# Patient Record
Sex: Female | Born: 1960
Health system: Southern US, Community
[De-identification: ages and names within clinical notes are randomized; demographics above are authoritative.]

## PROBLEM LIST (undated history)

## (undated) DIAGNOSIS — M25561 Pain in right knee: Secondary | ICD-10-CM

## (undated) DIAGNOSIS — E119 Type 2 diabetes mellitus without complications: Secondary | ICD-10-CM

## (undated) DIAGNOSIS — I00 Rheumatic fever without heart involvement: Secondary | ICD-10-CM

## (undated) DIAGNOSIS — M1990 Unspecified osteoarthritis, unspecified site: Secondary | ICD-10-CM

## (undated) DIAGNOSIS — I10 Essential (primary) hypertension: Secondary | ICD-10-CM

## (undated) DIAGNOSIS — K219 Gastro-esophageal reflux disease without esophagitis: Secondary | ICD-10-CM

## (undated) DIAGNOSIS — E669 Obesity, unspecified: Secondary | ICD-10-CM

## (undated) DIAGNOSIS — M25562 Pain in left knee: Secondary | ICD-10-CM

## (undated) DIAGNOSIS — J45909 Unspecified asthma, uncomplicated: Secondary | ICD-10-CM

## (undated) DIAGNOSIS — D649 Anemia, unspecified: Secondary | ICD-10-CM

## (undated) HISTORY — DX: Pain in left knee: M25.562

## (undated) HISTORY — DX: Unspecified osteoarthritis, unspecified site: M19.90

## (undated) HISTORY — DX: Anemia, unspecified: D64.9

## (undated) HISTORY — DX: Unspecified asthma, uncomplicated: J45.909

## (undated) HISTORY — DX: Rheumatic fever without heart involvement: I00

## (undated) HISTORY — DX: Obesity, unspecified: E66.9

## (undated) HISTORY — DX: Pain in right knee: M25.561

## (undated) HISTORY — DX: Gastro-esophageal reflux disease without esophagitis: K21.9

---

## 1998-03-09 ENCOUNTER — Encounter: Admission: RE | Admit: 1998-03-09 | Discharge: 1998-03-09 | Payer: Self-pay | Admitting: Family Medicine

## 1998-03-31 ENCOUNTER — Encounter: Admission: RE | Admit: 1998-03-31 | Discharge: 1998-03-31 | Payer: Self-pay | Admitting: Family Medicine

## 1998-04-18 ENCOUNTER — Encounter: Admission: RE | Admit: 1998-04-18 | Discharge: 1998-04-18 | Payer: Self-pay | Admitting: Family Medicine

## 1998-05-02 ENCOUNTER — Encounter: Admission: RE | Admit: 1998-05-02 | Discharge: 1998-05-02 | Payer: Self-pay | Admitting: Sports Medicine

## 1998-10-12 ENCOUNTER — Ambulatory Visit (HOSPITAL_COMMUNITY): Admission: RE | Admit: 1998-10-12 | Discharge: 1998-10-12 | Payer: Self-pay | Admitting: Internal Medicine

## 1998-10-12 ENCOUNTER — Encounter: Payer: Self-pay | Admitting: Internal Medicine

## 1999-05-13 ENCOUNTER — Emergency Department (HOSPITAL_COMMUNITY): Admission: EM | Admit: 1999-05-13 | Discharge: 1999-05-13 | Payer: Self-pay | Admitting: Emergency Medicine

## 1999-05-13 ENCOUNTER — Encounter: Payer: Self-pay | Admitting: *Deleted

## 1999-06-22 ENCOUNTER — Other Ambulatory Visit: Admission: RE | Admit: 1999-06-22 | Discharge: 1999-06-22 | Payer: Self-pay | Admitting: Obstetrics and Gynecology

## 1999-07-10 ENCOUNTER — Encounter (INDEPENDENT_AMBULATORY_CARE_PROVIDER_SITE_OTHER): Payer: Self-pay | Admitting: Specialist

## 1999-07-10 ENCOUNTER — Other Ambulatory Visit: Admission: RE | Admit: 1999-07-10 | Discharge: 1999-07-10 | Payer: Self-pay | Admitting: Obstetrics and Gynecology

## 2000-07-22 ENCOUNTER — Other Ambulatory Visit: Admission: RE | Admit: 2000-07-22 | Discharge: 2000-07-22 | Payer: Self-pay | Admitting: Obstetrics and Gynecology

## 2000-07-23 ENCOUNTER — Encounter: Admission: RE | Admit: 2000-07-23 | Discharge: 2000-07-23 | Payer: Self-pay | Admitting: Obstetrics and Gynecology

## 2000-07-23 ENCOUNTER — Encounter: Payer: Self-pay | Admitting: Obstetrics and Gynecology

## 2001-03-16 ENCOUNTER — Encounter (INDEPENDENT_AMBULATORY_CARE_PROVIDER_SITE_OTHER): Payer: Self-pay | Admitting: Specialist

## 2001-03-16 ENCOUNTER — Other Ambulatory Visit: Admission: RE | Admit: 2001-03-16 | Discharge: 2001-03-16 | Payer: Self-pay | Admitting: Obstetrics and Gynecology

## 2001-04-15 ENCOUNTER — Encounter (INDEPENDENT_AMBULATORY_CARE_PROVIDER_SITE_OTHER): Payer: Self-pay

## 2001-04-15 ENCOUNTER — Ambulatory Visit (HOSPITAL_COMMUNITY): Admission: RE | Admit: 2001-04-15 | Discharge: 2001-04-15 | Payer: Self-pay | Admitting: Obstetrics and Gynecology

## 2001-08-24 ENCOUNTER — Ambulatory Visit (HOSPITAL_COMMUNITY): Admission: RE | Admit: 2001-08-24 | Discharge: 2001-08-24 | Payer: Self-pay | Admitting: Obstetrics and Gynecology

## 2001-08-24 ENCOUNTER — Encounter: Payer: Self-pay | Admitting: Obstetrics and Gynecology

## 2001-09-23 ENCOUNTER — Other Ambulatory Visit: Admission: RE | Admit: 2001-09-23 | Discharge: 2001-09-23 | Payer: Self-pay | Admitting: Obstetrics and Gynecology

## 2002-10-19 ENCOUNTER — Encounter: Payer: Self-pay | Admitting: Obstetrics and Gynecology

## 2002-10-19 ENCOUNTER — Ambulatory Visit (HOSPITAL_COMMUNITY): Admission: RE | Admit: 2002-10-19 | Discharge: 2002-10-19 | Payer: Self-pay | Admitting: Obstetrics and Gynecology

## 2002-11-30 ENCOUNTER — Other Ambulatory Visit: Admission: RE | Admit: 2002-11-30 | Discharge: 2002-11-30 | Payer: Self-pay | Admitting: Obstetrics and Gynecology

## 2003-02-21 ENCOUNTER — Encounter: Payer: Self-pay | Admitting: Internal Medicine

## 2003-02-21 ENCOUNTER — Encounter: Admission: RE | Admit: 2003-02-21 | Discharge: 2003-02-21 | Payer: Self-pay | Admitting: Internal Medicine

## 2003-07-15 ENCOUNTER — Encounter: Payer: Self-pay | Admitting: Obstetrics and Gynecology

## 2003-07-15 ENCOUNTER — Encounter: Admission: RE | Admit: 2003-07-15 | Discharge: 2003-07-15 | Payer: Self-pay | Admitting: Obstetrics and Gynecology

## 2004-09-21 ENCOUNTER — Ambulatory Visit (HOSPITAL_COMMUNITY): Admission: RE | Admit: 2004-09-21 | Discharge: 2004-09-21 | Payer: Self-pay | Admitting: Gastroenterology

## 2004-10-25 ENCOUNTER — Encounter: Admission: RE | Admit: 2004-10-25 | Discharge: 2004-10-25 | Payer: Self-pay | Admitting: Obstetrics and Gynecology

## 2004-12-09 HISTORY — PX: ABDOMINAL HYSTERECTOMY: SHX81

## 2004-12-09 HISTORY — PX: HERNIA REPAIR: SHX51

## 2005-01-19 ENCOUNTER — Encounter: Admission: RE | Admit: 2005-01-19 | Discharge: 2005-01-19 | Payer: Self-pay | Admitting: Obstetrics and Gynecology

## 2005-05-22 ENCOUNTER — Inpatient Hospital Stay (HOSPITAL_COMMUNITY): Admission: RE | Admit: 2005-05-22 | Discharge: 2005-05-23 | Payer: Self-pay | Admitting: General Surgery

## 2005-05-22 ENCOUNTER — Encounter (INDEPENDENT_AMBULATORY_CARE_PROVIDER_SITE_OTHER): Payer: Self-pay | Admitting: Specialist

## 2005-11-11 ENCOUNTER — Encounter: Admission: RE | Admit: 2005-11-11 | Discharge: 2005-11-11 | Payer: Self-pay | Admitting: Obstetrics and Gynecology

## 2006-12-30 ENCOUNTER — Encounter: Admission: RE | Admit: 2006-12-30 | Discharge: 2006-12-30 | Payer: Self-pay | Admitting: Obstetrics and Gynecology

## 2008-01-09 ENCOUNTER — Encounter: Admission: RE | Admit: 2008-01-09 | Discharge: 2008-01-09 | Payer: Self-pay | Admitting: Internal Medicine

## 2008-01-30 ENCOUNTER — Encounter: Admission: RE | Admit: 2008-01-30 | Discharge: 2008-01-30 | Payer: Self-pay | Admitting: Internal Medicine

## 2008-04-10 ENCOUNTER — Emergency Department (HOSPITAL_COMMUNITY): Admission: EM | Admit: 2008-04-10 | Discharge: 2008-04-10 | Payer: Self-pay | Admitting: Emergency Medicine

## 2008-04-12 ENCOUNTER — Ambulatory Visit (HOSPITAL_COMMUNITY): Admission: RE | Admit: 2008-04-12 | Discharge: 2008-04-12 | Payer: Self-pay | Admitting: Emergency Medicine

## 2008-09-12 ENCOUNTER — Encounter: Admission: RE | Admit: 2008-09-12 | Discharge: 2008-09-12 | Payer: Self-pay | Admitting: Obstetrics and Gynecology

## 2010-12-30 ENCOUNTER — Encounter: Payer: Self-pay | Admitting: Obstetrics and Gynecology

## 2011-04-26 NOTE — Op Note (Signed)
NAMEANALYAH, MCCONNON             ACCOUNT NO.:  [id number]   MEDICAL RECORD NO.:  [id number]          PATIENT TYPE:  INP   LOCATION:  9311                          FACILITY:  [facility]   PHYSICIAN:  Maxie Better, M.D.DATE OF BIRTH:  July 03, 1961   DATE OF PROCEDURE:  05/22/2005  DATE OF DISCHARGE:                                 OPERATIVE REPORT   PREOPERATIVE DIAGNOSIS:  Severe dysmenorrhea, pelvic pain, menorrhagia,  uterine fibroids, morbid obesity, chronic hypertension, (BMI equals 65),  epigastric hernia.   POSTOPERATIVE DIAGNOSIS:  Submucosal fibroid, intramural fibroids,  menorrhagia, severe dysmenorrhea, morbid obesity, (BMI equals 65).   PROCEDURE:  Total vaginal hysterectomy with vaginal myomectomy.   ANESTHESIA:  General.   SURGEON:  Maxie Better, M.D.   ASSISTANT:  Cordelia Pen A. Dickstein, M.D.   DESCRIPTION OF PROCEDURE:  Under adequate general anesthesia, the patient  was placed in the dorsal lithotomy position.  Her abdomen and vulvar  perineal area and vagina were sterilely prepped and draped in the usual  sterile fashion for a combined abdominal and vaginal procedure.  An  indwelling Foley catheter was sterilely placed.  Examination under  anesthesia revealed an irregular 10 weeks size uterus, however, examination  was limited by the patient's body habitus.  A weighted speculum was placed  in the vagina.  Sims retractor was used anteriorly.  Cervix was grasped on  the anterior and posterior lip with Jacobsen clamps.  Dilute solution of  Pitressin was then circumferentially injected at the cervicovaginal  junction.  A circumferential incision was then made at the vaginal junction  and undermined with Mayo scissors.  The posterior cul-de-sac was then opened  without incident.  The vaginal cuff posteriorly was then hemostased with  running locked stitch of 0 Vicryl suture.  The uterosacral ligaments were  then bilaterally clamped, cut, and suture ligated with  Heaney suture and 0  Vicryl.  The cardinal ligaments were then serially clamped with the LigaSure  and then cut.  This procedure was carried up to the uterine vessels which  were then also clamped, cauterized, and cut.  Traction on the cervix did not  reveal significant descent of the uterine body and the procedure was then  continued in small increments of clamping and cutting the attachment to the  lateral wall.  The cervix was then bivalved and the large 6 cm submucosal  fibroid was noted.  This fibroid was enucleated from its base with sharp  dissection and then ultimately pulled out with thyroid clamps.  The  procedure was then continued and again the body of the uterus came down  somewhat further, but without as much exposure to the lateral side.  The  attachment of the round and the utero-ovarian ligament on the left was still  present, but exposure at that point was limited.  At this point, the uterus  was then morcellated further in the midline.  The cervix was truncated off  the body of the uterus and additional uterine tissue was then removed in  chunks.  That allowed for the left utero-ovarian ligament to be identified,  clamped, cut with the  left-sided attachment being removed.  The ligament was  then free tied with 0 Vicryl and then suture ligated with 0 Vicryl.  The  left half of the uterus was then removed.  The right on palpation of the  right ovary and tubal area showed a right cornual intramural fibroid,  portion which was then seen once the half of the uterus had been removed.  With the fibroid being visual, the fibroid was then sharply and bluntly  enucleated from its base in order to facilitate removal of the remaining  portion of the uterus.  Once that was accomplished, the right ovary which  was otherwise normal could be seen and the right tube.  The right utero-  ovarian ligaments were then clamped with the LigaSure, cauterized, and cut  until the remaining portion  of the uterus was removed.  The tubal attachment  was then clamped, cut, and then free tied with 0 Vicryl with hemostasis  noted.  The uterus was now removed.  Bleeding along the left pelvic side  wall of the vaginal area was suture ligated with 0 Vicryl figure-of-eight  suture.  The vagina after the areas were inspected, was then closed in a  vertical fashion using 0 Vicryl interrupted figure-of-eight sutures.  The  vaginal cuff was reinforced with 0 Vicryl figure-of-eight suture at the  posterior aspect secondary to bleeding.  Abraded area superior to the  urethral area was noted from the manipulation and 4-0 chromic suture was  then used to approximate this area of the skin as well as on the left  vaginal side wall.  Specimen was then uterus, removed in pieces.  Aggregate  pieces weighed 434 grams.  Estimated blood loss was 500 mL.  Intraoperative  fluid was 2 liters.  Urine output was 200 mL clear yellow urine.  Needle,  sponge, and instrument counts correct.  The patient then underwent the  epigastric hernia repair portion of the case by Leonie Man, M.D. and  please see his dictation for the specific details.       [place]/MEDQ  D:  05/22/2005  T:  05/22/2005  Job:  [id number]

## 2011-04-26 NOTE — Op Note (Signed)
Linda Cooper, Linda Cooper             ACCOUNT NO.:  [id number]   MEDICAL RECORD NO.:  [id number]          PATIENT TYPE:  AMB   LOCATION:  ENDO                         FACILITY:  [facility]   PHYSICIAN:  Anselmo Rod, M.D.  DATE OF BIRTH:  [birth year]   DATE OF PROCEDURE:  09/21/2004  DATE OF DISCHARGE:                                 OPERATIVE REPORT   PROCEDURE PERFORMED:  Esophagogastroduodenoscopy.   ENDOSCOPIST:  Anselmo Rod, M.D.   INSTRUMENT USED:  Olympus video panendoscope.   INDICATION FOR PROCEDURE:  A 50 year old African-American female with a  history of mild anemia and blood in stool.  Rule out peptic ulcer disease,  esophagitis, gastritis, etc.   PREPROCEDURE PREPARATION:  Informed consent was procured from the patient.  The patient was fasted for eight hours prior to the procedure.   PREPROCEDURE PHYSICAL:  VITAL SIGNS:  The patient had stable vital signs.  NECK:  Supple.  CHEST:  Clear to auscultation.  S1, S2 regular.  ABDOMEN:  Soft with normal bowel sounds.   DESCRIPTION OF PROCEDURE:  The patient was placed in the left lateral  decubitus position and sedated with 70 mg of Demerol and 7 mg of Versed in  slow incremental doses.  Once the patient was adequately sedate and  maintained on low-flow oxygen and continuous cardiac monitoring, the Olympus  video panendoscope was advanced through the mouthpiece, over the tongue,  into the esophagus under direct vision.  The entire esophagus appeared  normal with no evidence of ring, stricture, masses, esophagitis, or  Barrett's mucosa.  The stomach was then advanced in the stomach.  The entire  gastric mucosa and the proximal small bowel appeared normal.   IMPRESSION:  Normal EGD.   RECOMMENDATIONS:  Proceed with a colonoscopy at this time, further  recommendations made thereafter.       JNM/MEDQ  D:  09/21/2004  T:  09/21/2004  Job:  [id number]   cc:   Merlene Laughter. Renae Gloss, M.D.  [address]  [address]  [place]  [place] [address]  Fax: [phone]

## 2011-04-26 NOTE — Op Note (Signed)
Linda Cooper, Linda Cooper             ACCOUNT NO.:  [id number]   MEDICAL RECORD NO.:  [id number]          PATIENT TYPE:  AMB   LOCATION:  ENDO                         FACILITY:  [facility]   PHYSICIAN:  Anselmo Rod, M.D.  DATE OF BIRTH:  1960/12/26   DATE OF PROCEDURE:  09/21/2004  DATE OF DISCHARGE:                                 OPERATIVE REPORT   PROCEDURE:  Screening colonoscopy.   ENDOSCOPIST:  Anselmo Rod, M.D.   INSTRUMENT:  Olympus video colonoscope.   INDICATIONS FOR PROCEDURE:  A 50 year old morbidly obese African-American  female with a history of anemia, hemoglobin 11.5 g/dL, BRBPR, change in  bowel habits.  To rule out colonic polyps, masses, etc.   PRE-PROCEDURE PREPARATION:  Informed consent was procured from the patient.  Patient fasted for 8 hours prior to the procedure and prepped with a bottle  of magnesium citrate and a gallon of GoLYTELY the night prior to the  procedure.  Pre-procedure physical:  Patient had stable vital signs, neck  supple, chest clear to auscultation, S1/S2 regular, abdomen soft with normal  bowel sounds.   DESCRIPTION OF PROCEDURE:  The patient was placed in the left lateral  decubitus position, sedated with  Demerol and Versed at first for the EGD.  An additional mg of Versed was given for the colonoscopy.  Once the patient  was adequately sedated and maintained on low flow oxygen, continuous cardiac  monitoring; the Olympus video colonoscope was advanced from the rectum to  the cecum.  The appendiceal orifice and the ileocecal valve were clearly  visualized and photographed.  No masses, polyps, erosions or ulcerations,  etc., were seen.  Small, nonbleeding internal hemorrhoids were seen on  retroflexion of the rectum. The patient tolerated the procedure well without  immediate complications.   IMPRESSION:  1.  Normal colonoscopy to the cecum except for small internal hemorrhoids.      No masses or polyps seen.  2.  No evidence of  diverticulosis.   RECOMMENDATIONS:  1.  Repeat CBC on an outpatient basis.  2.  Continue a high fiber diet with liberal fluids intake.  3.  Repeat colonoscopy in the next 10 years unless the patient develops      abnormal symptoms in the interim.  4.  Outpatient follow up in the next 2 weeks or earlier if need be.       JNM/MEDQ  D:  09/21/2004  T:  09/21/2004  Job:  [id number]   cc:   Merlene Laughter. Renae Gloss, M.D.  [address]  [address]  [place]  [place] [address]  Fax: [phone]

## 2011-04-26 NOTE — H&P (Signed)
[facility]  Patient:    Linda Cooper, Linda Cooper                      MRN: [id number] Adm. Date:  04/15/01 Attending:  Nena Jordan A. Cherly Hensen, M.D.                         History and Physical  DATE OF BIRTH:                08/18/1961  CHIEF COMPLAINT:              Heavy menses, uterine fibroids.  HISTORY OF PRESENT ILLNESS:   This is a 50 year old gravida 1, para 1 single black female with last menstrual period April 03, 2001 who now presents for surgical management of menorrhagia and uterine fibroids.  The patient has noted very painful and heavy menses.  Ultrasound done on January 22, 2001 revealed a submucosal fibroid measuring 3.9 cm x 4.3 x 3.2 cm at its fundal area.  The patient also had additional small fibroids and multiple small follicles on the ovary.  The endometrium appeared to have a hypoechoic mass consistent with the fibroid that was noted.  The patient desires hysteroscopic resection of the fibroid if possible and if unable to be done transvaginally, then to proceed with abdominal myomectomy.  PAST MEDICAL HISTORY:         Morbid obesity, chronic hypertension, fibroid uterus.  ALLERGIES:                    ERYTHROMYCIN.  MEDICATIONS:                  1. Cozaar 100 mg p.o. q.d.                               2. Phentermine.                               3. Hydralazine 50 mg p.o. q.d.                               4. Glucophage 500 mg one p.o. b.i.d. (the                                  patient admits to not taking that regularly).                               5. Norvasc 10 mg p.o. q.d.  PAST SURGICAL HISTORY:        None.  FAMILY HISTORY:               No ______ or ovarian cancer.  Strong family history of diabetes.  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  REVIEW OF SYSTEMS:            Negative except for history of present illness.  PHYSICAL EXAMINATION  GENERAL:                      Obese black female in no  acute distress.  VITAL SIGNS:                  Blood pressure 118/88.  SKIN:                         No lesions.  HEENT:                        Anicteric sclerae.  Pink conjunctivae. Oropharynx:  Negative.  HEART:                        Regular rate and rhythm without murmur.  LUNGS:                        Clear to auscultation.  BREASTS:                      Soft, nontender.  No palpable mass.  BACK:                         No CVA tenderness.  ABDOMEN:                      Obese, soft, nontender with intertrigo underneath the pannus.  PELVIC:                       External genitalia:  No lesions.  Vagina:  No discharge.  Cervix:  Closed.  Uterus:  Irregular 8 weeks size.  Adnexa:  4-5 cm right palpable mass, however, examination limited by the patients body habitus.  IMPRESSION:                   Symptomatic uterine fibroid.  PLAN:                         Hysteroscopic resection of submucosal fibroid. If not, abdominal myomectomy.  Routine laboratories and antibiotics prophylaxis.  Risks and benefits of the procedure have been explained to the patient by her preoperative visit.  These include, but are not limited to, infection, bleeding, injury to surrounding organs, structural ______ injury, uterine perforation, inability to complete the procedure vaginally and possible need for blood transfusion, internal scar tissue, pelvic pain from scar tissue. DD:  04/15/01 TD:  04/15/01 Job: [id number]

## 2011-04-26 NOTE — Op Note (Signed)
[facility]  Patient:    Linda Cooper, Linda Cooper                    MRN: [id number] Proc. Date: 04/15/01 Adm. Date:  [id number] Attending:  Maxie Better                           Operative Report  PREOPERATIVE DIAGNOSIS:       1. Menorrhagia.                               2. Uterine fibroid.  POSTOPERATIVE DIAGNOSIS:      1. Menorrhagia.                               2. Submucosal fibroids.  OPERATION:                    1. Examination under anesthesia.                               2. Diagnostic hysteroscopy.                               3. Hysteroscopic resection of submucosal                                  fibroid.                               4. Dilatation and curettage.  SURGEON:                      Sheronette A. Cherly Hensen, M.D.  ASSISTANT:                    Cordelia Pen A. Rosalio Macadamia, M.D.  ANESTHESIA:                   General.  INDICATIONS:                  This is a 50 year old gravida 1, para 1 female with symptomatic uterine fibroids who now presents for further evaluation. Ultrasound had revealed a submucosal fibroid in the fundal area, and the patient was therefore counselled regarding the possibility that this may not be able to resected through the hysteroscope and possible need for abdominal myomectomy.  Risks and benefits of the procedure had been explained to the patient.  Consent was signed, and the patient was transferred to the operating room.  DESCRIPTION OF PROCEDURE:     Under adequate general anesthesia, the patient was placed in the dorsolithotomy position.  The patient had received prophylactic antibiotics intravenously.  Examination under anesthesia revealed anteverted uterus, irregular with a palpable right lateral fibroid, and no adnexal mass appreciated on the left.  The patient was thoroughly prepped and draped in the usual fashion.  The indwelling Foley catheter was placed in the preparation for  possible laparotomy.  Bivalve speculum was placed in the vagina.  Single-tooth tenaculum was place in the cervix.  The cervix was then serially dilated up to a #25 Pratt dilator.  A  glycine-primed diagnostic hysteroscope was introduced into the cavity for assessment.  Findings were that of both tubal ostia being able to be seen.  The fundus was without any evidence of a fibroid.  In the anterior and posterior wall, closer to the lower uterine segment, were submucosal fibroids.  The larger fibroid was in the anterior portion.  The diagnostic hysteroscope was removed.  The cervix was then serially dilated up to a #31 Pratt dilator, and a resectoscope was introduced.  Using the double loop, the anterior fibroid was subsequently resected.  The procedure was somewhat limited by the fogging of the camera as well as the loss of fluid on the floor as well as the undersurface garment of the patient in order to further assess the true fluid deficit.  Nevertheless, approximately 90% of the anterior fibroid was resected.  With the fluid deficit approximately 350 cc, decision was made to forego attempt at prolonging the surgery and resecting the posterior fibroid.  The fibroid was removed in pieces.  The resectoscope was removed.  The cavity was then explored with polyp forceps as well as curettage, and tissue was removed.  The procedure was then terminated by removal of all instruments from the vagina.  Specimen labelled.  Endometrial curetting and submucosal fibroid portions were sent to pathology.  Fluid deficit was 350 cc.  Complications none.  Estimated blood loss was about 100 cc.  The patient tolerated the procedure well and was transferred to the recovery room in stable condition. DD:  04/15/01 TD:  04/15/01 Job: [id number]

## 2011-04-26 NOTE — Op Note (Signed)
Linda Cooper, Linda Cooper             ACCOUNT NO.:  [id number]   MEDICAL RECORD NO.:  [id number]          PATIENT TYPE:  INP   LOCATION:  9399                          FACILITY:  [facility]   PHYSICIAN:  Leonie Man, M.D.   DATE OF BIRTH:  1961/06/01   DATE OF PROCEDURE:  05/22/2005  DATE OF DISCHARGE:                                 OPERATIVE REPORT   PREOPERATIVE DIAGNOSIS:  Incarcerated epigastric hernia.   POSTOPERATIVE DIAGNOSIS:  Incarcerated epigastric hernia.   PROCEDURE:  Repair of incarcerated ventral hernia with mesh.   SURGEON:  Leonie Man, M.D.   ASSISTANT:  O.R. Tech.   ANESTHESIA:  General.   NOTE:  The patient is a 50 year old morbidly obese female presenting with  epigastric pain and a mass noted to be a ventral hernia which has  incarcerated omentum within it.  The patient is scheduled for hysterectomy  on this day and headache undergone a vaginal hysterectomy with myomectomy at  the time I entered the operating room.  She was already asleep.  The abdomen  had been prepped and draped to be included in the sterile operative field.  I made an incision in the epigastrium and deepened this through the skin and  subcutaneous tissue, carrying the dissection down to the region of the  hernial defect.  The sac was grasped and dissected free from the surrounding  fat and soft tissues, carrying the dissection down to the fascia.  The  hernia sac was opened and dissected free.  Its attachments to the falciform  ligament were taken down between clamps and secured with ties of 2-0 silk.  The edges of the hernia sac were then cleared of all adhesions.  I placed a  piece of Seprafilm over the bowel so as to protect it from the mesh.  I used  an extra large mesh plug to bridge the defect, and this was sewn in with #1  Novofil suture in a continuous fashion around the entire defect.  The defect  was noted to be closed.  Sponge, instrument and sharp counts were verified.  The  subcutaneous tissues were then closed with a running 2-0 Vicryl suture  and the skin was closed with  running 4-0 Monocryl suture and then reinforced with Steri-Strips, sterile  dressings applied.  Anesthetic reversed, the patient removed from the  operating room to the recovery room in stable condition.  She tolerated the  procedure well.       PB/MEDQ  D:  05/22/2005  T:  05/22/2005  Job:  [id number]   cc:   Maxie Better, M.D.  [address]  [place]  [place] [address]  Fax: [phone]

## 2011-04-26 NOTE — H&P (Signed)
Linda Cooper, Linda Cooper             ACCOUNT NO.:  [id number]   MEDICAL RECORD NO.:  [id number]          PATIENT TYPE:  INP   LOCATION:  NA                            FACILITY:  [facility]   PHYSICIAN:  Sheronette Cousins, M.D.DATE OF BIRTH:  1961/10/10   DATE OF ADMISSION:  05/22/2005  DATE OF DISCHARGE:                                HISTORY & PHYSICAL   CHIEF COMPLAINT:  Pelvic pain, severe cramps, heavy menses, uterine  fibroids.   HISTORY OF PRESENT ILLNESS:  This is a 50 year old gravida 1, para 1,  morbidly obese single black female, last menstrual period of May 13, 2005,  who is now being admitted for total vaginal hysterectomy and possible total  abdominal hysterectomy. The patient will also undergo repair of an umbilical  hernia by Dr. Leonie Man. The patient has had regular cycles with  increasingly painful cramps. She is not sexually active. The patient has  noted increased clots.   Evaluation included an endometrial biopsy in March, 2006 that showed  proliferative endometrium and simple endometrial hyperplasia without atypia.  The patient underwent a pelvic MRI on January 19, 2005 with findings of the  study showing an anterior midline hernia with adipose tissue within it, a  uterus that was 13 cm in length, with some mucosal fibroids. A right-sided  intramural fibroid was also noted, measuring 9.3 cm.  The right ovary at that time that a 2.5 cm cyst, the left ovary was normal.  Ultrasound in the office on January 14, 2005: Ovaries were not able to be  seen and the uterus measured 10.8 x 8.6 x 7.5 with three fibroids noted, the  largest was 6.9 cm and was noted posteriorly, with a small submucosal  fibroid.   The patient presents for definitive surgical management. She desires  retention of her ovaries.   ALLERGIES:  ERYTHROMYCIN.   MEDICATIONS:  1.  Norvasc.  2.  Ditropan XL.   PAST MEDICAL HISTORY:  1.  Urge incontinence.  2.  Arthritis since age 40.  3.   Chronic hypertension.  4.  Morbid obesity.  5.  Degenerative joint disease.   PAST SURGICAL HISTORY:  Dilation and curettage, hysteroscopic fibroid  resection in 2002.   OBSTETRICAL HISTORY:  Vaginal delivery x 1.   FAMILY HISTORY:  Diabetes in mother, father and maternal grandmother, and  hypertension. No colon or breast cancer.   SOCIAL HISTORY:  Single R. N. One son.   REVIEW OF SYSTEMS:  Negative except for history of present illness.   PHYSICAL EXAMINATION:  GENERAL:  Morbidly obese black female in no acute  distress.  VITAL SIGNS: Blood pressure 130/90, pulse is 76, weight 334 pounds.  SKIN:  Notable for some facial hair.  HEENT EXAM:  Anicteric sclerae, pink conjunctivae. Oropharynx negative.  HEART:  Regular rate and rhythm without murmur.  LUNGS:  Clear to auscultation.  BREASTS:  Pedunculated, large, no palpable mass.  NECK:  Supple, thyroid not palpable.  BACK:  No CVA tenderness.  ABDOMEN:  Obese with a large pannus. Supraumbilical hernia.  PELVIC EXAM:  Vulva showed no lesions, vagina had some clots  in the vault.  Cervix was parous. Uterus was bulky, about 10 weeks size and rotated. Adnexa  limited by the patient's body habitus.  EXTREMITIES: No edema.  RECTAL EXAM:  Deferred.   IMPRESSION:  1.  Severe dysmenorrhea, menorrhagia, uterine fibroids.  2.  Morbid obesity.  3.  Chronic hypertension.  4.  Umbilical hernia.   PLAN:  Admission for total vaginal hysterectomy, possible total abdominal  hysterectomy.  Umbilical hernia repair by Dr. Lurene Shadow. Antibiotic prophylaxis, antiembolic  stockings.  The risk of the procedure had been explained to the patient including but  not limited to  inability to complete the procedure vaginally; infection; bleeding; injury  to bladder or bowel or ureter; possible need for blood transfusion and its  risk. Fistula formation. Pros and cons of ovarian retention including  possible need for surgery on the ovary due to pain.  Persistent cysts or  ovarian cancer, bowel obstruction in the future. The plan is to continue  antihypertensive medications, postoperative care and criteria for discharge  were reviewed.  All questions answered.       [place]/MEDQ  D:  05/22/2005  T:  05/22/2005  Job:  [id number]

## 2014-03-12 ENCOUNTER — Emergency Department (INDEPENDENT_AMBULATORY_CARE_PROVIDER_SITE_OTHER): Payer: No Typology Code available for payment source

## 2014-03-12 ENCOUNTER — Emergency Department (HOSPITAL_COMMUNITY)
Admission: EM | Admit: 2014-03-12 | Discharge: 2014-03-12 | Disposition: A | Discharge status: Home / Self Care | Payer: No Typology Code available for payment source | Source: Home / Self Care | Attending: Family Medicine | Admitting: Family Medicine

## 2014-03-12 ENCOUNTER — Encounter (HOSPITAL_COMMUNITY): Payer: Self-pay | Admitting: Emergency Medicine

## 2014-03-12 DIAGNOSIS — J40 Bronchitis, not specified as acute or chronic: Secondary | ICD-10-CM

## 2014-03-12 HISTORY — DX: Type 2 diabetes mellitus without complications: E11.9

## 2014-03-12 HISTORY — DX: Essential (primary) hypertension: I10

## 2014-03-12 MED ORDER — IPRATROPIUM-ALBUTEROL 0.5-2.5 (3) MG/3ML IN SOLN
3.0000 mL | Freq: Once | RESPIRATORY_TRACT | Status: AC
  Administered 2014-03-12: 3 mL via RESPIRATORY_TRACT

## 2014-03-12 MED ORDER — GUAIFENESIN-CODEINE 100-10 MG/5ML PO SOLN: 10.0000 mL | ORAL | Status: DC | PRN

## 2014-03-12 MED ORDER — AZITHROMYCIN 250 MG PO TABS: ORAL_TABLET | ORAL | Status: DC

## 2014-03-12 MED ORDER — IPRATROPIUM-ALBUTEROL 0.5-2.5 (3) MG/3ML IN SOLN
RESPIRATORY_TRACT | Status: AC
  Filled 2014-03-12: qty 3

## 2014-03-12 MED ORDER — ALBUTEROL SULFATE HFA 108 (90 BASE) MCG/ACT IN AERS: 2.0000 | INHALATION_SPRAY | RESPIRATORY_TRACT | Status: DC | PRN

## 2014-03-12 MED ORDER — BENZONATATE 200 MG PO CAPS: 200.0000 mg | ORAL_CAPSULE | Freq: Three times a day (TID) | ORAL | Status: DC | PRN

## 2014-03-12 NOTE — ED Notes (Signed)
Pt c/o productive cough onset Tuesday Sxs also include mild HA w/some wheezing Denies f/v/n/d, SOB Taking OTC cold meds w/no relief Alert w/no signs of acute distress.

## 2014-03-12 NOTE — Discharge Instructions (Signed)
Bronchitis  Bronchitis is inflammation of the airways that extend from the windpipe into the lungs (bronchi). The inflammation often causes mucus to develop, which leads to a cough. If the inflammation becomes severe, it may cause shortness of breath.  CAUSES   Bronchitis may be caused by:   · Viral infections.    · Bacteria.    · Cigarette smoke.    · Allergens, pollutants, and other irritants.    SIGNS AND SYMPTOMS   The most common symptom of bronchitis is a frequent cough that produces mucus. Other symptoms include:  · Fever.    · Body aches.    · Chest congestion.    · Chills.    · Shortness of breath.    · Sore throat.    DIAGNOSIS   Bronchitis is usually diagnosed through a medical history and physical exam. Tests, such as chest X-rays, are sometimes done to rule out other conditions.   TREATMENT   You may need to avoid contact with whatever caused the problem (smoking, for example). Medicines are sometimes needed. These may include:  · Antibiotics. These may be prescribed if the condition is caused by bacteria.  · Cough suppressants. These may be prescribed for relief of cough symptoms.    · Inhaled medicines. These may be prescribed to help open your airways and make it easier for you to breathe.    · Steroid medicines. These may be prescribed for those with recurrent (chronic) bronchitis.  HOME CARE INSTRUCTIONS  · Get plenty of rest.    · Drink enough fluids to keep your urine clear or pale yellow (unless you have a medical condition that requires fluid restriction). Increasing fluids may help thin your secretions and will prevent dehydration.    · Only take over-the-counter or prescription medicines as directed by your health care provider.  · Only take antibiotics as directed. Make sure you finish them even if you start to feel better.  · Avoid secondhand smoke, irritating chemicals, and strong fumes. These will make bronchitis worse. If you are a smoker, quit smoking. Consider using nicotine gum or  skin patches to help control withdrawal symptoms. Quitting smoking will help your lungs heal faster.    · Put a cool-mist humidifier in your bedroom at night to moisten the air. This may help loosen mucus. Change the water in the humidifier daily. You can also run the hot water in your shower and sit in the bathroom with the door closed for 5 10 minutes.    · Follow up with your health care provider as directed.    · Wash your hands frequently to avoid catching bronchitis again or spreading an infection to others.    SEEK MEDICAL CARE IF:  Your symptoms do not improve after 1 week of treatment.   SEEK IMMEDIATE MEDICAL CARE IF:  · Your fever increases.  · You have chills.    · You have chest pain.    · You have worsening shortness of breath.    · You have bloody sputum.  · You faint.    · You have lightheadedness.  · You have a severe headache.    · You vomit repeatedly.  MAKE SURE YOU:   · Understand these instructions.  · Will watch your condition.  · Will get help right away if you are not doing well or get worse.  Document Released: 11/25/2005 Document Revised: 09/15/2013 Document Reviewed: 07/20/2013  ExitCare® Patient Information ©2014 [employer].    Antibiotic Nonuse   Your caregiver felt that the infection or problem was not one that would be helped with an antibiotic.  Infections   may be caused by viruses or bacteria. Only a caregiver can tell which one of these is the likely cause of an illness. A cold is the most common cause of infection in both adults and children. A cold is a virus. Antibiotic treatment will have no effect on a viral infection. Viruses can lead to many lost days of work caring for sick children and many missed days of school. Children may catch as many as 10 "colds" or "flus" per year during which they can be tearful, cranky, and uncomfortable. The goal of treating a virus is aimed at keeping the ill person comfortable.  Antibiotics are medications used to help the body fight  bacterial infections. There are relatively few types of bacteria that cause infections but there are hundreds of viruses. While both viruses and bacteria cause infection they are very different types of germs. A viral infection will typically go away by itself within 7 to 10 days. Bacterial infections may spread or get worse without antibiotic treatment.  Examples of bacterial infections are:  · Sore throats (like strep throat or tonsillitis).  · Infection in the lung (pneumonia).  · Ear and skin infections.  Examples of viral infections are:  · Colds or flus.  · Most coughs and bronchitis.  · Sore throats not caused by Strep.  · Runny noses.  It is often best not to take an antibiotic when a viral infection is the cause of the problem. Antibiotics can kill off the helpful bacteria that we have inside our body and allow harmful bacteria to start growing. Antibiotics can cause side effects such as allergies, nausea, and diarrhea without helping to improve the symptoms of the viral infection. Additionally, repeated uses of antibiotics can cause bacteria inside of our body to become resistant. That resistance can be passed onto harmful bacterial. The next time you have an infection it may be harder to treat if antibiotics are used when they are not needed. Not treating with antibiotics allows our own immune system to develop and take care of infections more efficiently. Also, antibiotics will work better for us when they are prescribed for bacterial infections.  Treatments for a child that is ill may include:  · Give extra fluids throughout the day to stay hydrated.  · Get plenty of rest.  · Only give your child over-the-counter or prescription medicines for pain, discomfort, or fever as directed by your caregiver.  · The use of a cool mist humidifier may help stuffy noses.  · Cold medications if suggested by your caregiver.  Your caregiver may decide to start you on an antibiotic if:  · The problem you were seen for  today continues for a longer length of time than expected.  · You develop a secondary bacterial infection.  SEEK MEDICAL CARE IF:  · Fever lasts longer than 5 days.  · Symptoms continue to get worse after 5 to 7 days or become severe.  · Difficulty in breathing develops.  · Signs of dehydration develop (poor drinking, rare urinating, dark colored urine).  · Changes in behavior or worsening tiredness (listlessness or lethargy).  Document Released: 02/03/2002 Document Revised: 02/17/2012 Document Reviewed: 08/02/2009  ExitCare® Patient Information ©2014 [employer].

## 2014-03-12 NOTE — ED Provider Notes (Signed)
CSN: [id number]     Arrival date & time 03/12/14  1756 History   None    Chief Complaint  Patient presents with  . Cough   (Consider location/radiation/quality/duration/timing/severity/associated sxs/prior Treatment) HPI Comments: 53 year old female with past medical history of hypertension and diabetes presents complaining of cough, sore throat, body aches, headache, congestion, rhinorrhea. It started off with just congestion and rhinorrhea and her symptoms have gradually gotten worse. The cough started the next day, and the body aches started yesterday. She is feeling quite sick at this time. She is reminded of a previous when she had bronchitis. She has used cough drops but no other medications tried for treatment. No fever, chills, chest pain, shortness of breath. No pleuritic chest pain. No recent travel or sick contacts. No leg swelling.  Patient is a 53 y.o. female presenting with cough.  Cough Associated symptoms: myalgias, rhinorrhea, sore throat and wheezing   Associated symptoms: no chest pain, no ear pain, no rash and no shortness of breath     Past Medical History  Diagnosis Date  . Hypertension   . Diabetes mellitus without complication    Past Surgical History  Procedure Laterality Date  . Abdominal hysterectomy     No family history on file. History  Substance Use Topics  . Smoking status: Never Smoker   . Smokeless tobacco: Not on file  . Alcohol Use: No   OB History   Grav Para Term Preterm Abortions TAB SAB Ect Mult Living                 Review of Systems  Constitutional: Positive for fatigue.  HENT: Positive for congestion, postnasal drip, rhinorrhea, sinus pressure and sore throat. Negative for ear pain.   Respiratory: Positive for cough and wheezing. Negative for chest tightness and shortness of breath.   Cardiovascular: Negative for chest pain and leg swelling.  Gastrointestinal: Negative for nausea, vomiting, abdominal pain and diarrhea.   Musculoskeletal: Positive for myalgias.  Skin: Negative for rash.    Allergies  Review of patient's allergies indicates no known allergies.  Home Medications   Current Outpatient Rx  Name  Route  Sig  Dispense  Refill  . albuterol (PROVENTIL HFA;VENTOLIN HFA) 108 (90 BASE) MCG/ACT inhaler   Inhalation   Inhale 2 puffs into the lungs every 4 (four) hours as needed for wheezing.   1 Inhaler   0   . azithromycin (ZITHROMAX Z-PAK) 250 MG tablet      Use as directed   6 each   0   . benzonatate (TESSALON) 200 MG capsule   Oral   Take 1 capsule (200 mg total) by mouth 3 (three) times daily as needed for cough.   20 capsule   0   . guaiFENesin-codeine 100-10 MG/5ML syrup   Oral   Take 10 mLs by mouth every 4 (four) hours as needed for cough.   120 mL   0    BP 191/113  Pulse 90  Temp(Src) 98.6 F (37 C) (Oral)  Resp 22  SpO2 95% Physical Exam  Nursing note and vitals reviewed. Constitutional: She is oriented to person, place, and time. Vital signs are normal. She appears well-developed and well-nourished. No distress.  HENT:  Head: Normocephalic and atraumatic.  Right Ear: External ear normal.  Left Ear: External ear normal.  Mouth/Throat: Oropharynx is clear and moist. No oropharyngeal exudate.  Eyes: Conjunctivae are normal. Right eye exhibits no discharge. Left eye exhibits no discharge.  Neck: Normal  range of motion. Neck supple.  Cardiovascular: Normal rate, regular rhythm and normal heart sounds.  Exam reveals no gallop and no friction rub.   No murmur heard. Pulmonary/Chest: Effort normal and breath sounds normal. No respiratory distress. She has no wheezes. She has no rales.  Cardiopulmonary exam unreliable due to habitus  Lymphadenopathy:       Head (right side): No submandibular and no tonsillar adenopathy present.       Head (left side): No submandibular and no tonsillar adenopathy present.    She has cervical adenopathy.       Right cervical:  Superficial cervical adenopathy present. No posterior cervical adenopathy present.      Left cervical: No superficial cervical and no posterior cervical adenopathy present.       Right: No supraclavicular adenopathy present.       Left: No supraclavicular adenopathy present.  Neurological: She is alert and oriented to person, place, and time. She has normal strength. Coordination normal.  Skin: Skin is warm and dry. No rash noted. She is not diaphoretic.  Psychiatric: She has a normal mood and affect. Judgment normal.    ED Course  Procedures (including critical care time) Labs Review Labs Reviewed - No data to display Imaging Review Dg Chest 2 View  03/12/2014   CLINICAL DATA:  Cough  EXAM: CHEST  2 VIEW  COMPARISON:  Apr 10, 2008  FINDINGS: Lungs are clear. Heart is mildly enlarged with normal pulmonary vascularity. No adenopathy. No pneumothorax. No bone lesions. Aorta is mildly tortuous. There is degenerative change in the thoracic spine.  IMPRESSION: Mild cardiac enlargement.  No edema or consolidation.   Electronically Signed   By: Bretta Bang M.D.   On: 03/12/2014 18:50     MDM   1. Bronchitis    Symptomatic improvement with DuoNeb. We'll treat with albuterol inhaler, Tessalon Perles, Cheratussin for 2 days, prescribed a Z-Pak to take in 2 days if still worsening or in 3-4 days if no improvement. Followup when necessary   Meds ordered this encounter  Medications  . ipratropium-albuterol (DUONEB) 0.5-2.5 (3) MG/3ML nebulizer solution 3 mL    Sig:   . albuterol (PROVENTIL HFA;VENTOLIN HFA) 108 (90 BASE) MCG/ACT inhaler    Sig: Inhale 2 puffs into the lungs every 4 (four) hours as needed for wheezing.    Dispense:  1 Inhaler    Refill:  0    Order Specific Question:  Supervising Provider    Answer:  Bradd Canary D [id number]  . benzonatate (TESSALON) 200 MG capsule    Sig: Take 1 capsule (200 mg total) by mouth 3 (three) times daily as needed for cough.    Dispense:  20  capsule    Refill:  0    Order Specific Question:  Supervising Provider    Answer:  Linna Hoff [phone]  . guaiFENesin-codeine 100-10 MG/5ML syrup    Sig: Take 10 mLs by mouth every 4 (four) hours as needed for cough.    Dispense:  120 mL    Refill:  0    Order Specific Question:  Supervising Provider    Answer:  Linna Hoff [phone]  . azithromycin (ZITHROMAX Z-PAK) 250 MG tablet    Sig: Use as directed    Dispense:  6 each    Refill:  0    Order Specific Question:  Supervising Provider    Answer:  Bradd Canary D [[id number]]       Graylon Good, PA-C 03/12/14  1914 

## 2014-03-14 NOTE — ED Provider Notes (Signed)
Medical screening examination/treatment/procedure(s) were performed by resident physician or non-physician practitioner and as supervising physician I was immediately available for consultation/collaboration.   Audi Conover DOUGLAS MD.   Kyng Matlock D Chala Gul, MD 03/14/14 2119 

## 2014-05-06 ENCOUNTER — Emergency Department (INDEPENDENT_AMBULATORY_CARE_PROVIDER_SITE_OTHER)
Admission: EM | Admit: 2014-05-06 | Discharge: 2014-05-06 | Disposition: A | Discharge status: Home / Self Care | Payer: No Typology Code available for payment source | Source: Home / Self Care | Attending: Family Medicine | Admitting: Family Medicine

## 2014-05-06 ENCOUNTER — Encounter (HOSPITAL_COMMUNITY): Payer: Self-pay | Admitting: Emergency Medicine

## 2014-05-06 DIAGNOSIS — H6120 Impacted cerumen, unspecified ear: Secondary | ICD-10-CM

## 2014-05-06 NOTE — ED Notes (Signed)
C/o hearing loss in one ear, requesting evaluation

## 2014-05-06 NOTE — Discharge Instructions (Signed)

## 2014-05-06 NOTE — ED Provider Notes (Signed)
CSN: [id number]     Arrival date & time 05/06/14  1251 History   First MD Initiated Contact with Patient 05/06/14 1422     No chief complaint on file.   Patient is a 53 y.o. female presenting with plugged ear sensation. The history is provided by the patient.  Ear Fullness This is a new problem. The current episode started 12 to 24 hours ago. The problem occurs constantly. The problem has not changed since onset.Pertinent negatives include no chest pain, no abdominal pain, no headaches and no shortness of breath. Nothing aggravates the symptoms.  Pt reports onset of fullness and pressure type sensation in her left ear yesterday. Then slowly noticed she was having difficulty hearing out of the ear as well. Never had these symptoms before. Denies fever or other associated symptoms.   Past Medical History  Diagnosis Date  . Hypertension   . Diabetes mellitus without complication    Past Surgical History  Procedure Laterality Date  . Abdominal hysterectomy     No family history on file. History  Substance Use Topics  . Smoking status: Never Smoker   . Smokeless tobacco: Not on file  . Alcohol Use: No   OB History   Grav Para Term Preterm Abortions TAB SAB Ect Mult Living                 Review of Systems  Respiratory: Negative for shortness of breath.   Cardiovascular: Negative for chest pain.  Gastrointestinal: Negative for abdominal pain.  Neurological: Negative for headaches.  All other systems reviewed and are negative.   Allergies  Review of patient's allergies indicates no known allergies.  Home Medications   Prior to Admission medications   Medication Sig Start Date End Date Taking? Authorizing Provider  albuterol (PROVENTIL HFA;VENTOLIN HFA) 108 (90 BASE) MCG/ACT inhaler Inhale 2 puffs into the lungs every 4 (four) hours as needed for wheezing. 03/12/14   Graylon Good, PA-C  azithromycin (ZITHROMAX Z-PAK) 250 MG tablet Use as directed 03/12/14   Graylon Good, PA-C   benzonatate (TESSALON) 200 MG capsule Take 1 capsule (200 mg total) by mouth 3 (three) times daily as needed for cough. 03/12/14   Adrian Blackwater Baker, PA-C  guaiFENesin-codeine 100-10 MG/5ML syrup Take 10 mLs by mouth every 4 (four) hours as needed for cough. 03/12/14   Adrian Blackwater Baker, PA-C   BP 166/93  Pulse 76  Temp(Src) 98 F (36.7 C) (Oral)  Resp 20  SpO2 99% Physical Exam  Constitutional: She is oriented to person, place, and time. She appears well-developed and well-nourished.  HENT:  Head: Normocephalic and atraumatic.  Large cerumen impaction to (L) ear as reported by EMT upon initial assessment. Resolved compltely with irrgation.  Eyes: Conjunctivae are normal.  Cardiovascular: Normal rate.   Pulmonary/Chest: Effort normal.  Neurological: She is alert and oriented to person, place, and time.  Skin: Skin is warm and dry.  Psychiatric: She has a normal mood and affect.    ED Course  Procedures (including critical care time) Labs Review Labs Reviewed - No data to display  Imaging Review No results found.   MDM   1. Cerumen impaction     Pressure, fullness and diminished hearing in (L) ear x 24 hrs. Large cerumen impaction (L) ear. Symptoms completely resolved w/ ear irrigation.     [name], NP 05/06/14 [phone]

## 2014-05-06 NOTE — ED Provider Notes (Signed)
Medical screening examination/treatment/procedure(s) were performed by resident physician or non-physician practitioner and as supervising physician I was immediately available for consultation/collaboration.   KINDL,JAMES DOUGLAS MD.   James D Kindl, MD 05/06/14 1505 

## 2014-09-23 ENCOUNTER — Other Ambulatory Visit: Payer: Self-pay

## 2014-10-24 ENCOUNTER — Emergency Department (INDEPENDENT_AMBULATORY_CARE_PROVIDER_SITE_OTHER)
Admission: EM | Admit: 2014-10-24 | Discharge: 2014-10-24 | Disposition: A | Discharge status: Other Acute Inpatient Hospital | Payer: No Typology Code available for payment source | Source: Home / Self Care | Attending: Emergency Medicine | Admitting: Emergency Medicine

## 2014-10-24 ENCOUNTER — Emergency Department (HOSPITAL_COMMUNITY)
Admission: EM | Admit: 2014-10-24 | Discharge: 2014-10-24 | Disposition: A | Discharge status: Home / Self Care | Payer: No Typology Code available for payment source | Attending: Emergency Medicine | Admitting: Emergency Medicine

## 2014-10-24 ENCOUNTER — Emergency Department (HOSPITAL_COMMUNITY): Payer: No Typology Code available for payment source

## 2014-10-24 ENCOUNTER — Encounter (HOSPITAL_COMMUNITY): Payer: Self-pay | Admitting: *Deleted

## 2014-10-24 ENCOUNTER — Encounter (HOSPITAL_COMMUNITY): Payer: Self-pay

## 2014-10-24 DIAGNOSIS — Z8709 Personal history of other diseases of the respiratory system: Secondary | ICD-10-CM | POA: Insufficient documentation

## 2014-10-24 DIAGNOSIS — Z79899 Other long term (current) drug therapy: Secondary | ICD-10-CM | POA: Diagnosis not present

## 2014-10-24 DIAGNOSIS — R0781 Pleurodynia: Secondary | ICD-10-CM

## 2014-10-24 DIAGNOSIS — R079 Chest pain, unspecified: Secondary | ICD-10-CM

## 2014-10-24 DIAGNOSIS — E119 Type 2 diabetes mellitus without complications: Secondary | ICD-10-CM | POA: Diagnosis not present

## 2014-10-24 DIAGNOSIS — R791 Abnormal coagulation profile: Secondary | ICD-10-CM | POA: Insufficient documentation

## 2014-10-24 DIAGNOSIS — R0789 Other chest pain: Secondary | ICD-10-CM

## 2014-10-24 DIAGNOSIS — Z791 Long term (current) use of non-steroidal anti-inflammatories (NSAID): Secondary | ICD-10-CM | POA: Insufficient documentation

## 2014-10-24 DIAGNOSIS — I10 Essential (primary) hypertension: Secondary | ICD-10-CM | POA: Insufficient documentation

## 2014-10-24 DIAGNOSIS — R0602 Shortness of breath: Secondary | ICD-10-CM

## 2014-10-24 LAB — URINALYSIS, ROUTINE W REFLEX MICROSCOPIC
BILIRUBIN URINE: NEGATIVE
GLUCOSE, UA: NEGATIVE mg/dL
KETONES UR: NEGATIVE mg/dL
NITRITE: NEGATIVE
Protein, ur: 100 mg/dL — AB
SPECIFIC GRAVITY, URINE: 1.02 (ref 1.005–1.030)
Urobilinogen, UA: 0.2 mg/dL (ref 0.0–1.0)
pH: 6.5 (ref 5.0–8.0)

## 2014-10-24 LAB — BASIC METABOLIC PANEL
Anion gap: 12 (ref 5–15)
BUN: 18 mg/dL (ref 6–23)
CALCIUM: 9.2 mg/dL (ref 8.4–10.5)
CO2: 24 mEq/L (ref 19–32)
CREATININE: 0.64 mg/dL (ref 0.50–1.10)
Chloride: 101 mEq/L (ref 96–112)
GFR calc non Af Amer: >90 mL/min (ref >90)
GLUCOSE: 107 mg/dL — AB (ref 70–99)
POTASSIUM: 4.2 meq/L (ref 3.7–5.3)
Sodium: 137 mEq/L (ref 137–147)

## 2014-10-24 LAB — TROPONIN I: Troponin I: <0.3 ng/mL (ref <0.30)

## 2014-10-24 LAB — URINE MICROSCOPIC-ADD ON

## 2014-10-24 LAB — CBC
HEMATOCRIT: 36.9 % (ref 36.0–46.0)
HEMOGLOBIN: 12.1 g/dL (ref 12.0–15.0)
MCH: 25.4 pg — AB (ref 26.0–34.0)
MCHC: 32.8 g/dL (ref 30.0–36.0)
MCV: 77.4 fL — AB (ref 78.0–100.0)
Platelets: 310 10*3/uL (ref 150–400)
RBC: 4.77 MIL/uL (ref 3.87–5.11)
RDW: 15.7 % — ABNORMAL HIGH (ref 11.5–15.5)
WBC: 10 10*3/uL (ref 4.0–10.5)

## 2014-10-24 LAB — D-DIMER, QUANTITATIVE (NOT AT ARMC): D DIMER QUANT: 0.75 ug{FEU}/mL — AB (ref 0.00–0.48)

## 2014-10-24 MED ORDER — IOHEXOL 350 MG/ML SOLN
100.0000 mL | Freq: Once | INTRAVENOUS | Status: AC | PRN
  Administered 2014-10-24: 100 mL via INTRAVENOUS

## 2014-10-24 MED ORDER — AMLODIPINE BESYLATE 10 MG PO TABS: 10.0000 mg | ORAL_TABLET | Freq: Every day | ORAL | Status: DC

## 2014-10-24 MED ORDER — ASPIRIN 81 MG PO CHEW
324.0000 mg | CHEWABLE_TABLET | Freq: Once | ORAL | Status: AC
  Administered 2014-10-24: 324 mg via ORAL
  Filled 2014-10-24: qty 4

## 2014-10-24 NOTE — Discharge Instructions (Signed)
We have determined that your problem requires further evaluation in the emergency department.  We will take care of your transport there.  Once at the emergency department, you will be evaluated by a provider and they will order whatever treatment or tests they deem necessary.  We cannot guarantee that they will do any specific test or do any specific treatment.  ° °

## 2014-10-24 NOTE — ED Notes (Addendum)
Pt in from urgent care, sent here for further evaluation from hypertension and pleuritic chest pain, pt sent here to r/o PE, pt denies long trips but reports increased sitting due to class, noted swelling to bilateral lower legs, worse to left leg

## 2014-10-24 NOTE — Discharge Instructions (Signed)
Return to the ED with any concerns including difficulty breathing, worsening chest pain, fainting, decreased level of alertness/lethargy, or any other alarming symptoms   Emergency Department Resource Guide 1) Find a Doctor and Pay Out of Pocket Although you won't have to find out who is covered by your insurance plan, it is a good idea to ask around and get recommendations. You will then need to call the office and see if the doctor you have chosen will accept you as a new patient and what types of options they offer for patients who are self-pay. Some doctors offer discounts or will set up payment plans for their patients who do not have insurance, but you will need to ask so you aren't surprised when you get to your appointment.  2) Contact Your Local Health Department Not all health departments have doctors that can see patients for sick visits, but many do, so it is worth a call to see if yours does. If you don't know where your local health department is, you can check in your phone book. The CDC also has a tool to help you locate your state's health department, and many state websites also have listings of all of their local health departments.  3) Find a Walk-in Clinic If your illness is not likely to be very severe or complicated, you may want to try a walk in clinic. These are popping up all over the country in pharmacies, drugstores, and shopping centers. They're usually staffed by nurse practitioners or physician assistants that have been trained to treat common illnesses and complaints. They're usually fairly quick and inexpensive. However, if you have serious medical issues or chronic medical problems, these are probably not your best option.  No Primary Care Doctor: - Call Health Connect at  [phone] - they can help you locate a primary care doctor that  accepts your insurance, provides certain services, etc. - Physician Referral Service- [phone]  Chronic Pain  Problems: Organization         Address  Phone   Notes  [facility]  [phone] Patients need to be referred by their primary care doctor.   Medication Assistance: Organization         Address  Phone   Notes  [facility] Medication [facility] [address] [place], [place] [address] [phone]) [phone] --Must be a resident of [place] [facility] -- Must have NO insurance coverage whatsoever (no Medicaid/ Medicare, etc.) -- The pt. MUST have a primary care doctor that directs their care regularly and follows them in the community   MedAssist  [phone]   [employer]  [phone]    Agencies that provide inexpensive medical care: Organization         Address  Phone   Notes  [facility]  [phone]) [phone]   [facility]    [phone]) [phone]   [facility] [address] [place], [place] [address] [phone]   [facility] [place] [address], [place] [phone]   Planned Parenthood    [phone]) [phone]   [facility]    [phone]) [phone]   [facility] and [facility]  [address], [place] Phone:  [phone], Fax:  [phone] Hours of Operation:  9 am - 6 pm, M-F.  Also accepts Medicaid/Medicare and self-pay.  [facility]  [address], [place] Phone: [phone](336)  [phone], Fax: [phone]. Hours of Operation:  8:30 am - 5:30 pm, M-F.  Also accepts Medicaid and self-pay.  [facility] [place] [address], [place] Phone: [phone]) [phone]   [facility] [address], [place], [place] [phone], Ext. [phone] Mondays & Thursdays: 7-9 AM.  First 15 patients are seen on a first come, first serve basis.    Medicaid-accepting [facility] Providers:  Organization         Address  Phone   Notes  [facility] [address], [place] [phone] Also  accepts self-pay patients.  [facility] [address], [place]  [phone]) [phone]   [facility] [address], [address], [place] [phone]) [phone]   [facility] [address] [place] [address], [place] [phone]) [phone]   Renaye RakersVeita Bland [address], [place]   [phone]) [phone] Only accepts [place] Access [place] patients after they have their name applied to their card.   Self-Pay (no insurance) in [facility] [place]:  Organization         Address  Phone   Notes  Sickle Cell Patients, [facility] [address], [place] [phone]) [phone]   [facility] [address], [place] [phone](336) [phone]   [facility] [place]  [address], [place] [phone]) [phone]   [facility]/Dr. Osei-Bonsu  [address] [place] [address], [place] or [id number], [place] [phone] Phone number for both [place] and [place] locations is the same.  [facility] and [facility] [address], [place] [phone]   [facility] [address] [place] [address], [place] or [address] [phone]) [phone]) [phone]   [facility] [address], [place] [phone]) [phone], phone; [phone], fax Sees patients 1st and 3rd Saturday of every month.  Must not qualify for public or private insurance (i.e. Medicaid, Medicare, [place] Health Choice, Veterans' Benefits)  Household income should be no more than 200% of the poverty level The clinic cannot treat you if you are pregnant or think you are pregnant  Sexually transmitted diseases are not treated at the clinic.    Dental Care: Organization         Address  Phone  Notes  [facility] of [facility] [address], [place] [phone] Accepts children up to age 55 who are enrolled in [place] or [place] Health Choice; pregnant  women with a Medicaid card; and children who have applied for Medicaid or [place] Health Choice, but were declined, whose parents can pay a reduced fee at time of service.  [facility] of [facility] [place]  [address], [place] [phone]) [phone] Accepts children up to age 55 who are enrolled in [place] or [place] Health Choice; pregnant women with a Medicaid card; and children who have applied for Medicaid or [place] Health Choice, but were declined, whose parents can pay a reduced fee at time of service.  [facility]  [address], [place] [phone] Patients are seen by appointment only. Walk-ins are not accepted. [facility] will see patients 55 years of age and older. Monday - Tuesday (8am-5pm) Most Wednesdays (8:30-5pm) $30 per visit, cash only  [facility]  [address], [place] [phone] Patients are seen by appointment only. Walk-ins are not accepted. [facility] will see patients 55 years of age and older. One Wednesday Evening (Monthly: Volunteer Based).  $30 per visit, cash only  [employer]  [phone] for adults; Children under age 54, call Graduate Pediatric Dentistry at [phone]. Children aged 51-14, please call [phone] to request a pediatric application.  Dental services are provided in all areas of dental care including fillings, crowns and bridges, complete and partial dentures, implants, gum treatment, root canals, and extractions. Preventive care is also provided. Treatment is provided to both adults and children. Patients are selected via a lottery and there is often a waiting list.   [facility] [address], [place]  [phone] [email or web address]   [facility] [address] [place], [place] [phone], Ext. [phone] Second and Fourth Thursday of each month, opens at 6:30 AM; Clinic ends at 9 AM.  Patients are  seen on a first-come first-served basis, and a limited number are seen during each clinic.   [facility]  [address] Ether Griffins [facility] [facility], [place] [phone]   Eligibility Requirements You must have lived in [place], [place], or [place] counties for at least the last three months.   You cannot be eligible for state or federal sponsored [place], including [employer], [place], or [employer].   You generally cannot be eligible for healthcare insurance through your employer.    How to apply: Eligibility screenings are held every Tuesday and Wednesday afternoon from 1:00 pm until 4:00 pm. You do not need an appointment for the interview!  [facility] [address], [place], [place] [phone]   [facility] Health Department  [phone]   [facility]  [phone]   [facility] Health Department  [phone]    Behavioral Health Resources in the Community: Intensive Outpatient Programs Organization         Address  Phone  Notes  [facility] [address] [place], [place] [phone]   [facility] [address] [place], [place] [phone]   [facility] [address] [place], [place]  [phone]   [facility] [address]  [place], [place] [phone] or [phone]   Substance Abuse Resources Organization         Address  Phone  Notes  [facility] Drug Services  [phone]   [facility]  [phone]   [facility]  [phone]   Floydene Flock  [phone]   Residential & Outpatient Substance Abuse Program  [phone]   Psychological Services Organization         Address  Phone  Notes  [facility]  [phone]   [facility]  [phone]   [facility] [address], [place] [phone] or [phone]    Mobile Crisis  Teams Organization         Address  Phone  Notes  [facility], Mobile Crisis Care Unit  [phone]   [facility]  [address] [place], [place] [phone]   Doristine Locks [address] [place] [phone]    Self-Help/Support Groups Organization         Address  Phone             Notes  [facility] [place] - variety of support groups  [phone]- [phone] Call for more information  Narcotics Anonymous (NA), [facility] [address], [place]  2 meetings at this location   Residential Sports administratorTreatment Programs Organization         Address  Phone  Notes  [facility] [address],    [place]  [phone]   [facility]  [address], [place] [phone], [place], [place] [phone]   [facility] [address], [place] [phone] Admissions: 8am-3pm M-F  [facility] [address]    [place], [place] [phone]   [facility] [address], [place], [place] [phone]   [facility] [address]  [place], [place] [phone]   [facility] - Intensive Outpatient [address], [place], [place] [phone]   [facility] ([facility] [address]  [place], [place] [phone] or [phone]   [facility] (RTS) [address] [place], [place] [phone] Accepts Medicaid  [facility] [address]  [place] [phone] Substance Abuse/Addiction Treatment   [facility] Behavioral Health Resources Organization         Address  Phone  Notes  [facility]  [phone]) [phone]   Angie FavaJulie Brannon, PhD [address], [address] [place], [place]   [phone]) [phone] or [phone](336) [phone]   [facility]   [address] [place], [place] [phone](336) [phone]   [facility] [address], [place], [place] [phone](336) [phone]  Insurance/Medicaid/sponsorship through [facility]  [facility]ith and Families [address]                                    [place], [place] [phone]) [phone] Therapy/tele-psych/case  [facility] [address]   [place], [place] [phone](336) [phone]    Dr. Lolly MustacheArfeen  [phone]) [phone]   [facility] [place]  [employer] [facility]. 1) [address], [place] 2) [address], [place] 3)  [address], [place] [phone]) [phone]) [phone]  [phone](336) [phone]   [facility] Child Abuse Hotline [phone]) [phone] or [phone](336) [phone] (After Hours)

## 2014-10-24 NOTE — ED Notes (Signed)
C/o 2 week duration of chest pain, better when belching. 4 at its worst. NAD at present

## 2014-10-24 NOTE — ED Provider Notes (Signed)
Chief Complaint   Chest Pain   History of Present Illness    Linda Cooper is a 53 year old female with injury to diabetes and untreated hypertension who presents with a one-week history of sharp, intermittent, pleuritic substernal chest pain. The pain last for seconds and is worse with breathing and also with twisting. It's been associated with some shortness of breath and ankle swelling. She denies any associated nausea or diaphoresis. The pain is not exertional. She denies any cardiac history. She denies any history of DVT or pulmonary embolism. She's also had a weeklong history of dry cough and wheezing. She denies any leg pain but has had bilateral leg swelling. She has a history of rheumatic fever and heart murmur. She's had no fever or chills. She denies any indigestion or heartburn. She has had lots of burping. She tried Tums with some partial relief. She denies any palpitations, dizziness, syncope, or presyncope. She's had no abdominal pain, nausea, or vomiting.  Review of Systems    Other than noted above, the patient denies any of the following symptoms. Systemic:  No fever or chills. Pulmonary:  No cough, wheezing, shortness of breath, sputum production, hemoptysis. Cardiac:  No palpitations, rapid heartbeat, dizziness, presyncope or syncope. GI:  No abdominal pain, heartburn, nausea, or vomiting. Ext:  No leg pain or swelling.  PMFSH    Past medical history, family history, social history, meds, and allergies were reviewed. She is allergic to erythromycin. She has a history of type 2 diabetes, hypertension, and obesity. She is not taking any medication for these right now. She was on medication previously.  Physical Exam     Vital signs:  Pulse 115  Temp(Src) 98.1 F (36.7 C) (Oral)  SpO2 97% Gen:  Alert, oriented, in no distress, skin warm and dry. ENT:  Mucous membranes moist, pharynx clear. Neck:  Supple, no adenopathy or tenderness.  No JVD. Lungs:  Clear to  auscultation, no wheezes, rales or rhonchi.  No respiratory distress. Heart:  Regular rhythm.  No gallops, murmers, clicks or rubs. Chest:  No chest wall tenderness. Abdomen:  Soft, nontender, no organomegaly or mass.  Bowel sounds normal.  No pulsatile abdominal mass or bruit. Ext:  No edema.  No calf tenderness and Homann's sign negative.  Pulses full and equal. Skin:  Warm and dry.  No rash.  EKG Results:  Date: 10/24/2014  Rate:   Rhythm: normal sinus rhythm  QRS Axis: normal  Intervals: normal  ST/T Wave abnormalities: normal  Conduction Disutrbances:none  Narrative Interpretation: normal sinus rhythm, normal EKG  Old EKG Reviewed: none available                                                                                                                                      Assessment     The encounter diagnosis was Pleuritic chest pain.  I am concerned about the possibility of pulmonary embolism.  Plan     The patient was transferred to the ED via [facility] in stable condition.  Medical Decision Making:  53 year old female with untreated HT and type 2 DM has a 1 week history of intermittent, pleuritic, sharp substernal chest pain.  Pain is non-exertional and associated with shortness of breath and bilateral ankle swelling but no leg pain, nausea, or diaphoresis.  She denies any cardiac history or history of blood clots.  She also has a 1 week history of dry cough and wheezing.  Her EKG was normal.  Her pulse was 115 and O2 sat 97%.  On exam her lungs are clear and her chest is not tender to palpation.  I am concerned about PE in this high risk individual and feel she needs further workup.         Linda Likesavid C Alesandro Stueve, MD 10/24/14 [phone]

## 2014-10-24 NOTE — ED Provider Notes (Signed)
CSN: [id number]     Arrival date & time 10/24/14  1753 History   First MD Initiated Contact with Patient 10/24/14 1845     Chief Complaint  Patient presents with  . Hypertension     (Consider location/radiation/quality/duration/timing/severity/associated sxs/prior Treatment) HPI  Pt with hx of dm and hypertension presents with one week history of sharp intermittent sternal pains.  She states she felt she had bronchitis approx 1 week ago with cough and wheezing.  These symptoms have resolved but she continues to have intermittent sharp pains in her anterior chest.  Pain is worse with certain movements.  Not painful to palpation.  She has chronic leg swelling which is unchanged.  No associated with exertion. No sob or diaphoresis or nausea.  No radiation of pain.  She tried taking tums without relief.  No fever/chills.  No hx of DVT/PE, no recent travel/trauma/surgery.  There are no other associated systemic symptoms, there are no other alleviating or modifying factors.  She was seen at the urgent care today and referred to the ED for further workup.  She states she has not been taking norvasc for past 6-8 months due to not having a doctor.   Past Medical History  Diagnosis Date  . Hypertension   . Diabetes mellitus without complication    Past Surgical History  Procedure Laterality Date  . Abdominal hysterectomy     History reviewed. No pertinent family history. History  Substance Use Topics  . Smoking status: Never Smoker   . Smokeless tobacco: Not on file  . Alcohol Use: No   OB History    No data available     Review of Systems  ROS reviewed and all otherwise negative except for mentioned in HPI    Allergies  Erythromycin base  Home Medications   Prior to Admission medications   Medication Sig Start Date End Date Taking? Authorizing Provider  AFLURIA PRESERVATIVE FREE 0.5 ML SUSY Inject 1 Dose as directed once. 08/27/14  Yes Historical Provider, MD  Multiple  Vitamins-Minerals (MULTIVITAMIN WITH MINERALS) tablet Take 1 tablet by mouth daily.   Yes Historical Provider, MD  naproxen sodium (ANAPROX) 220 MG tablet Take 440 mg by mouth 2 (two) times daily with a meal.   Yes Historical Provider, MD  vitamin B-12 (CYANOCOBALAMIN) 1000 MCG tablet Take 1,000 mcg by mouth daily.   Yes Historical Provider, MD  Vitamin D, Ergocalciferol, (DRISDOL) 50000 UNITS CAPS capsule Take 50,000 Units by mouth every 7 (seven) days.   Yes Historical Provider, MD  albuterol (PROVENTIL HFA;VENTOLIN HFA) 108 (90 BASE) MCG/ACT inhaler Inhale 2 puffs into the lungs every 4 (four) hours as needed for wheezing. Patient not taking: Reported on 10/24/2014 03/12/14   Graylon Good, PA-C  amLODipine (NORVASC) 10 MG tablet Take 1 tablet (10 mg total) by mouth daily. 10/24/14   Ethelda Chick, MD  azithromycin (ZITHROMAX Z-PAK) 250 MG tablet Use as directed 03/12/14   Graylon Good, PA-C  benzonatate (TESSALON) 200 MG capsule Take 1 capsule (200 mg total) by mouth 3 (three) times daily as needed for cough. 03/12/14   Adrian Blackwater Baker, PA-C  guaiFENesin-codeine 100-10 MG/5ML syrup Take 10 mLs by mouth every 4 (four) hours as needed for cough. 03/12/14   Adrian Blackwater Baker, PA-C   BP 172/99 mmHg  Pulse 82  Temp(Src) 98.1 F (36.7 C) (Oral)  Resp 18  Ht 5\' 2"  (1.575 m)  Wt 320 lb (145.151 kg)  BMI 58.51 kg/m2  SpO2 97%  Vitals reviewed Physical Exam  Physical Examination: General appearance - alert, well appearing, and in no distress, obese Mental status - alert, oriented to person, place, and time Eyes - no conjunctival injection, no scleral icterus Mouth - mucous membranes moist, pharynx normal without lesions Chest - clear to auscultation, no wheezes, rales or rhonchi, symmetric air entry, normal respiratory effort Heart - normal rate, regular rhythm, normal S1, S2, no murmurs, rubs, clicks or gallops Abdomen - soft, nontender, nondistended, no masses or organomegaly Extremities -  peripheral pulses normal, 1+ pedal edema bilaterally, nonpitting, no clubbing or cyanosis Skin - normal coloration and turgor, no rashes,  ED Course  Procedures (including critical care time)  6:55 PM went to see patient, not yet in room Labs Review Labs Reviewed  CBC - Abnormal; Notable for the following:    MCV 77.4 (*)    MCH 25.4 (*)    RDW 15.7 (*)    All other components within normal limits  BASIC METABOLIC PANEL - Abnormal; Notable for the following:    Glucose, Bld 107 (*)    All other components within normal limits  D-DIMER, QUANTITATIVE - Abnormal; Notable for the following:    D-Dimer, Quant 0.75 (*)    All other components within normal limits  URINALYSIS, ROUTINE W REFLEX MICROSCOPIC - Abnormal; Notable for the following:    APPearance CLOUDY (*)    Hgb urine dipstick TRACE (*)    Protein, ur 100 (*)    Leukocytes, UA SMALL (*)    All other components within normal limits  URINE MICROSCOPIC-ADD ON - Abnormal; Notable for the following:    Squamous Epithelial / LPF MANY (*)    Bacteria, UA FEW (*)    All other components within normal limits  TROPONIN I    Imaging Review Dg Chest 2 View  10/24/2014   CLINICAL DATA:  Shortness of breath with left leg swelling over several days. History of hypertension and diabetes. Initial encounter.  EXAM: CHEST  2 VIEW  COMPARISON:  Radiographs 03/12/2014.  FINDINGS: Mild cardiomegaly appears stable. The heart size is exaggerated by a narrow AP diameter of the chest on the lateral view. The lungs are clear. There is no pleural effusion or pneumothorax. No acute osseous findings are demonstrated.  IMPRESSION: Stable chest.  No acute cardiopulmonary process.   Electronically Signed   By: Roxy HorsemanBill  Veazey M.D.   On: 10/24/2014 20:13   Ct Angio Chest Pe W/cm &/or Wo Cm  10/24/2014   CLINICAL DATA:  Mid chest pain for 2 days, some shortness of breath, question pulmonary embolism; personal history of hypertension, diabetes mellitus,  bronchitis  EXAM: CT ANGIOGRAPHY CHEST WITH CONTRAST  TECHNIQUE: Multidetector CT imaging of the chest was performed using the standard protocol during bolus administration of intravenous contrast. Multiplanar CT image reconstructions and MIPs were obtained to evaluate the vascular anatomy.  CONTRAST:  100mL OMNIPAQUE IOHEXOL 350 MG/ML SOLN  COMPARISON:  None ; prior exam of 05/13/1999 predates PACs and is no longer available for comparison.  FINDINGS: Aorta normal caliber without aneurysm or dissection.  Normal and upper normal size mediastinal lymph nodes.  Degradation of image quality secondary to body habitus as well as respiratory motion artifacts at lower lobes.  Pulmonary arteries grossly patent without evidence pulmonary embolism.  Heart appears enlarged.  Visualized liver and spleen unremarkable.  Minimal atelectasis LEFT lower lobe.  Lungs otherwise grossly clear.  No definite infiltrate, pleural effusion or pneumothorax.  Multilevel endplate spur formation thoracic spine.  Review of the MIP images confirms the above findings.  IMPRESSION: No definite evidence of pulmonary embolism with mild limitations of exam secondary to body habitus and respiratory motion.  Minimal basilar atelectasis.   Electronically Signed   By: Ulyses SouthwardMark  Boles M.D.   On: 10/24/2014 21:31     EKG Interpretation   Date/Time:  Monday October 24 2014 19:10:37 EST Ventricular Rate:  91 PR Interval:  172 QRS Duration: 103 QT Interval:  397 QTC Calculation: 488 R Axis:   52 Text Interpretation:  Sinus rhythm Low voltage, precordial leads  Borderline prolonged QT interval Baseline wander in lead(s) I III aVL V2  V4 No significant change since last tracing Confirmed by [facility]  MD,  Amonte Brookover [phone]) on 10/24/2014 7:15:18 PM      MDM   Final diagnoses:  Shortness of breath  Chest pain  Chest wall pain    Pt presenting with c/o chest pain, hypertension.  Her workup reveals reassuring EKG, normal troponin.  cxr  reassuring.  Her d-dimer was elevated, so CT angio obtained and this was also reassuring. Pain is muscular in nature- worse with certain positions and movements.  Pt given rx for norvasc 10mg  which is the HTN med she had been taking months ago.  Case manager has seen her and has made an appointment with [facility] for [facility].  Pt is hypertensive, but no evidence of end organ damage.  Discharged with strict return precautions.  Pt agreeable with plan.    Ethelda ChickMartha K Linker, MD 10/24/14 [phone]

## 2014-10-24 NOTE — Care Management (Signed)
ED CM in room to speak with patient in regarding f/u care establishing care with a primary care. Patient states that she does not have insurance at this time or a PCP. Offered assistance with finding a PCP patient agreeable. Discussed [facility] and their services, patient is agreeable to establishing care. Appt. scheduled for 11/24 with Dr. Armen PickupFunches  To establish care. Patient reminded to call at least 24 hours ahead if she is unable to keep the appointment. Pt verbalized understanding. Dr. Karma GanjaLinker updated. Prescriptions will be affordable from the [facility] $4 list. No further CM needs identified.

## 2014-11-01 ENCOUNTER — Ambulatory Visit (HOSPITAL_BASED_OUTPATIENT_CLINIC_OR_DEPARTMENT_OTHER): Payer: No Typology Code available for payment source | Admitting: Internal Medicine

## 2014-11-10 ENCOUNTER — Ambulatory Visit: Payer: No Typology Code available for payment source | Attending: Internal Medicine | Admitting: Family Medicine

## 2014-11-10 ENCOUNTER — Encounter: Payer: Self-pay | Admitting: Family Medicine

## 2014-11-10 VITALS — BP 160/93 | HR 88 | Temp 97.9°F | Resp 18 | Ht 62.0 in | Wt 329.0 lb

## 2014-11-10 DIAGNOSIS — E282 Polycystic ovarian syndrome: Secondary | ICD-10-CM | POA: Diagnosis not present

## 2014-11-10 DIAGNOSIS — R609 Edema, unspecified: Secondary | ICD-10-CM | POA: Insufficient documentation

## 2014-11-10 DIAGNOSIS — R739 Hyperglycemia, unspecified: Secondary | ICD-10-CM

## 2014-11-10 DIAGNOSIS — Z90710 Acquired absence of both cervix and uterus: Secondary | ICD-10-CM | POA: Diagnosis not present

## 2014-11-10 DIAGNOSIS — E119 Type 2 diabetes mellitus without complications: Secondary | ICD-10-CM | POA: Insufficient documentation

## 2014-11-10 DIAGNOSIS — L680 Hirsutism: Secondary | ICD-10-CM | POA: Insufficient documentation

## 2014-11-10 DIAGNOSIS — I10 Essential (primary) hypertension: Secondary | ICD-10-CM | POA: Insufficient documentation

## 2014-11-10 DIAGNOSIS — Z6841 Body Mass Index (BMI) 40.0 and over, adult: Secondary | ICD-10-CM | POA: Diagnosis not present

## 2014-11-10 DIAGNOSIS — I152 Hypertension secondary to endocrine disorders: Secondary | ICD-10-CM

## 2014-11-10 DIAGNOSIS — M170 Bilateral primary osteoarthritis of knee: Secondary | ICD-10-CM | POA: Insufficient documentation

## 2014-11-10 LAB — LIPID PANEL
CHOL/HDL RATIO: 2 ratio
Cholesterol: 124 mg/dL (ref 0–200)
HDL: 63 mg/dL (ref >39)
LDL CALC: 40 mg/dL (ref 0–99)
Triglycerides: 104 mg/dL (ref <150)
VLDL: 21 mg/dL (ref 0–40)

## 2014-11-10 LAB — POCT GLYCOSYLATED HEMOGLOBIN (HGB A1C): HEMOGLOBIN A1C: 6.7

## 2014-11-10 LAB — VITAMIN B12: Vitamin B-12: 1176 pg/mL — ABNORMAL HIGH (ref 211–911)

## 2014-11-10 LAB — GLUCOSE, POCT (MANUAL RESULT ENTRY): POC Glucose: 118 mg/dl — AB (ref 70–99)

## 2014-11-10 MED ORDER — LOSARTAN POTASSIUM 50 MG PO TABS: 50.0000 mg | ORAL_TABLET | Freq: Every day | ORAL | Status: DC

## 2014-11-10 MED ORDER — METFORMIN HCL ER 500 MG PO TB24: 1000.0000 mg | ORAL_TABLET | Freq: Every day | ORAL | Status: DC

## 2014-11-10 NOTE — Progress Notes (Signed)
Establish Care HU ER visit Hx DM

## 2014-11-10 NOTE — Patient Instructions (Signed)
Ms. Linda Cooper,  Thank you for coming in today. It was a pleasure meeting you. I look forward to being your primary doctor.   1. Diabetes:  Well controlled. Add metformin to help with weight loss goal. Continue low carb diet. Be as active as possible.  Checking lipids.   2. HTN: Add losartan 50 mg daily. This is the ARB.  Continue amlodipine 10 mg daily.    3. Hirsutism: Waxing is helpful, try [employer] on [address] ask for Dierdre SearlesLi or her husband.  Aqua glycolic helps minimized ingrowns and scarring- the solution, face cream and face wash are all available on [email or web address].   F/u in 2-4 weeks for knee injections with me, afternoon appointment.   Dr. Armen PickupFunches

## 2014-11-10 NOTE — Progress Notes (Signed)
   Subjective:    Patient ID: Linda CleaverVictoria J Schmoker, female    DOB: June 21, 1961, 53 y.o.   MRN: [id number] CC: establish care, ED f/u for HTN and DM2 HPI 53 yo F with hx of PCOS, morbid obesity:  1. CHRONIC DIABETES  Disease Monitoring  Blood Sugar Ranges: does not check   Polyuria: yes   Visual problems: no   Medication Compliance: no  Medication Side Effects  Hypoglycemia: no   Preventitive Health Care  Eye Exam: due   Foot Exam: due  Diet pattern: fair   Exercise: minimal limited by b/l knee pain   2. CHRONIC HYPERTENSION  Disease Monitoring  Blood pressure range: does not check   Chest pain: no   Dyspnea: yes, with certain activities    Claudication: no   Medication compliance: yes, norvasc x 1 week  Medication Side Effects  Lightheadedness: no   Urinary frequency: yes   Edema: yes   Impotence: no  Soc Hx: chronic non smoker Med Hx: DM2 dx in  Surg Hx: s/p hysterectomy 2006  Review of Systems As per HPI     Objective:   Physical Exam BP 160/93 mmHg  Pulse 88  Temp(Src) 97.9 F (36.6 C) (Oral)  Resp 18  Ht 5\' 2"  (1.575 m)  Wt 329 lb (149.233 kg)  BMI 60.16 kg/m2  SpO2 100% General appearance: alert, cooperative, mild distress and morbidly obese,  Lungs: clear to auscultation bilaterally Heart: regular rate and rhythm, S1, S2 normal, no murmur, click, rub or gallop Extremities: edema 1 + b/l   Knees: full ROM, no crepitus, anterior tenderness b/l, no erythema or warmth, mild effusion on L, moderate on R Foot exam done today   Lab Results  Component Value Date   HGBA1C 6.7 11/10/2014        Assessment & Plan:

## 2014-11-11 LAB — MICROALBUMIN / CREATININE URINE RATIO
Creatinine, Urine: 28.9 mg/dL
MICROALB UR: 1.6 mg/dL (ref <2.0)
Microalb Creat Ratio: 55.4 mg/g — ABNORMAL HIGH (ref 0.0–30.0)

## 2014-11-11 NOTE — Assessment & Plan Note (Addendum)
A: A1c elevated but not terribly P: Foot exam done today Lipids, ACR Metformin Low carb diet  Referral to ophthalmology  Normal cholesterol but given diabetes and HTN statin recommended, lipitor 40 sent to pharmacy.

## 2014-11-11 NOTE — Assessment & Plan Note (Signed)
A: improved with amlodipine 10 daily, no worsening edema P: Add cozarr 50 mg daily to regimen

## 2014-11-15 MED ORDER — ATORVASTATIN CALCIUM 40 MG PO TABS: 40.0000 mg | ORAL_TABLET | Freq: Every day | ORAL | Status: DC

## 2014-11-15 NOTE — Addendum Note (Signed)
Addended by: Dessa PhiFUNCHES, Kerensa Nicklas on: 11/15/2014 08:26 AM   Modules accepted: Orders

## 2014-11-16 ENCOUNTER — Telehealth: Payer: Self-pay | Admitting: *Deleted

## 2014-11-16 NOTE — Telephone Encounter (Signed)
-----   Message from Lora PaulaJosalyn C Funches, MD sent at 11/15/2014  8:25 AM EST ----- Normal cholesterol but given diabetes and HTN statin recommended, lipitor 40 sent to pharmacy.  Elevated urine ACR on ARB.  Elevated vit B12 level, no need to treat no need to supplement.

## 2014-11-16 NOTE — Telephone Encounter (Signed)
Left voice message to return call 

## 2014-11-17 ENCOUNTER — Telehealth: Payer: Self-pay | Admitting: *Deleted

## 2014-11-17 NOTE — Telephone Encounter (Signed)
    Dyann KiefStella M Hubert Raatz, RMA at 11/16/2014 5:14 PM    Status: Signed      Expand All Collapse All    ----- Message from Lora PaulaJosalyn C Funches, MD sent at 11/15/2014 8:25 AM EST ----- Pt aware of results.    Normal cholesterol but given diabetes and HTN statin recommended, lipitor 40 sent to pharmacy.  Elevated urine ACR on ARB.  Elevated vit B12 level, no need to treat no need to supplement.

## 2014-12-05 ENCOUNTER — Encounter: Payer: Self-pay | Admitting: Family Medicine

## 2014-12-05 ENCOUNTER — Ambulatory Visit: Payer: No Typology Code available for payment source | Attending: Family Medicine | Admitting: Family Medicine

## 2014-12-05 VITALS — BP 162/108 | HR 87 | Temp 98.1°F | Resp 18 | Ht 62.5 in | Wt 331.0 lb

## 2014-12-05 DIAGNOSIS — I10 Essential (primary) hypertension: Secondary | ICD-10-CM | POA: Insufficient documentation

## 2014-12-05 DIAGNOSIS — Z6841 Body Mass Index (BMI) 40.0 and over, adult: Secondary | ICD-10-CM | POA: Diagnosis not present

## 2014-12-05 DIAGNOSIS — M170 Bilateral primary osteoarthritis of knee: Secondary | ICD-10-CM | POA: Diagnosis not present

## 2014-12-05 DIAGNOSIS — I152 Hypertension secondary to endocrine disorders: Secondary | ICD-10-CM

## 2014-12-05 MED ORDER — LOSARTAN POTASSIUM 100 MG PO TABS: 100.0000 mg | ORAL_TABLET | Freq: Every day | ORAL | Status: DC

## 2014-12-05 NOTE — Progress Notes (Signed)
F/U Knee pain,  Question about HTN medication

## 2014-12-05 NOTE — Progress Notes (Signed)
   Subjective:    Patient ID: Linda Cooper, female    DOB: 1961-11-10, 53 y.o.   MRN: [id number] CC: f/u knee pain, HTN HPI 53 yo F with morbid obesity  presents for f/u visit:  1. B/l knee pain: R.L. Chronic. Would like injections. L knee is locking.   2. HTN: compliant with norvasc and losartan. Intermittent HA. No CP or SOB.    Soc Hx: chronic non smoker  Review of Systems As per HPI    Objective:   Physical Exam BP 158/106 mmHg  Pulse 87  Temp(Src) 98.1 F (36.7 C) (Oral)  Resp 18  Ht 5' 2.5" (1.588 m)  Wt 331 lb (150.141 kg)  BMI 59.54 kg/m2  SpO2 98%  Wt Readings from Last 3 Encounters:  12/05/14 331 lb (150.141 kg)  11/10/14 329 lb (149.233 kg)  10/24/14 320 lb (145.151 kg)  General appearance: alert, cooperative and no distress Lungs: clear to auscultation bilaterally Heart: regular rate and rhythm, S1, S2 normal, no murmur, click, rub or gallop Extremities: extremities normal, atraumatic, no cyanosis or edema, no knee erythema or tenderness to palpation.   After obtaining informed consent and cleaning the skin using iodine and alcohol a  steroid injection was performed at R knee using 1% plain Lidocaine and 80 mg of Depo Medrol. This was well tolerated     Assessment & Plan:

## 2014-12-05 NOTE — Patient Instructions (Signed)
Ms. Jake SeatsDubose,  Thank you for coming in today. Please ice and rest your knee today. You may resume normal activity tomorrow afternoon.  Call if you develop redness, swelling or severe pain in R knee.   For HTN: increase losartan to 100 mg daily. Keeping norvasc the same at 10 mg daily.   F/u in 1-2 weeks for L knee injection  F/u in 3-4 weeks for BP  Dr. Armen PickupFunches

## 2014-12-06 ENCOUNTER — Other Ambulatory Visit: Payer: Self-pay | Admitting: *Deleted

## 2014-12-06 DIAGNOSIS — I152 Hypertension secondary to endocrine disorders: Secondary | ICD-10-CM

## 2014-12-06 MED ORDER — LOSARTAN POTASSIUM 100 MG PO TABS: 100.0000 mg | ORAL_TABLET | Freq: Every day | ORAL | Status: DC

## 2014-12-06 MED ORDER — METHYLPREDNISOLONE ACETATE 80 MG/ML IJ SUSP: 80.0000 mg | Freq: Once | INTRAMUSCULAR | Status: DC

## 2014-12-06 NOTE — Assessment & Plan Note (Signed)
A: persistent  P: R knee corticosteroid injection today Return in one week for L knee

## 2014-12-06 NOTE — Assessment & Plan Note (Signed)
A: elevated above goal Meds: compliant P: Continue norvasc 10 daily  Increase losartan to 100 mg daily  Add hydralazine at f/u if needed

## 2014-12-15 ENCOUNTER — Ambulatory Visit: Payer: No Typology Code available for payment source | Attending: Family Medicine | Admitting: Family Medicine

## 2014-12-15 ENCOUNTER — Encounter: Payer: Self-pay | Admitting: Family Medicine

## 2014-12-15 ENCOUNTER — Telehealth: Payer: Self-pay | Admitting: Family Medicine

## 2014-12-15 VITALS — BP 141/84 | HR 97 | Temp 98.3°F | Resp 18 | Ht 62.0 in | Wt 328.0 lb

## 2014-12-15 DIAGNOSIS — Z6841 Body Mass Index (BMI) 40.0 and over, adult: Secondary | ICD-10-CM | POA: Insufficient documentation

## 2014-12-15 DIAGNOSIS — I10 Essential (primary) hypertension: Secondary | ICD-10-CM | POA: Insufficient documentation

## 2014-12-15 DIAGNOSIS — E119 Type 2 diabetes mellitus without complications: Secondary | ICD-10-CM

## 2014-12-15 DIAGNOSIS — M25561 Pain in right knee: Secondary | ICD-10-CM | POA: Insufficient documentation

## 2014-12-15 DIAGNOSIS — I152 Hypertension secondary to endocrine disorders: Secondary | ICD-10-CM

## 2014-12-15 DIAGNOSIS — M170 Bilateral primary osteoarthritis of knee: Secondary | ICD-10-CM

## 2014-12-15 DIAGNOSIS — M25562 Pain in left knee: Secondary | ICD-10-CM | POA: Insufficient documentation

## 2014-12-15 MED ORDER — METHYLPREDNISOLONE ACETATE 80 MG/ML IJ SUSP
80.0000 mg | Freq: Once | INTRAMUSCULAR | Status: AC
  Administered 2014-12-15: 80 mg via INTRA_ARTICULAR

## 2014-12-15 MED ORDER — ASPIRIN EC 81 MG PO TBEC
81.0000 mg | DELAYED_RELEASE_TABLET | Freq: Every day | ORAL | Status: DC
Start: 2014-12-15 — End: 2017-05-15

## 2014-12-15 NOTE — Assessment & Plan Note (Signed)
A; no well controlled P: Continue current regimen F/u in 2 months

## 2014-12-15 NOTE — Progress Notes (Signed)
   Subjective:    Patient ID: Linda CleaverVictoria J Macadam, female    DOB: April 03, 1961, 54 y.o.   MRN: [id number] CC: f/u knee pain, HTN HPI  54 yo F with morbid obesity  presents for f/u visit:  1. B/l knee pain: R >L. Chronic. Had L knee injection 10 days ago. Reports that her pain is much improved. She would like injection in R knee today.   2. HTN: taking norvasc and cozaar. No HA, CP or SOB.  Soc Hx: chronic non smoker  Review of Systems As per HPI    Objective:   Physical Exam  BP 141/84 mmHg  Pulse 97  Temp(Src) 98.3 F (36.8 C) (Oral)  Resp 18  Ht 5\' 2"  (1.575 m)  Wt 328 lb (148.78 kg)  BMI 59.98 kg/m2  SpO2 98%  BP Readings from Last 3 Encounters:  12/15/14 141/84  12/05/14 162/108  11/10/14 160/93    Wt Readings from Last 3 Encounters:  12/05/14 331 lb (150.141 kg)  11/10/14 329 lb (149.233 kg)  10/24/14 320 lb (145.151 kg)  General appearance: alert, cooperative and no distress Extremities: obese,  extremities normal, atraumatic, no cyanosis or edema, no knee erythema or tenderness to palpation.   After obtaining informed consent and cleaning the skin using iodine and alcohol a  steroid injection was performed at L knee using 1% plain Lidocaine and 80 mg of Depo Medrol. This was well tolerated     Assessment & Plan:

## 2014-12-15 NOTE — Patient Instructions (Addendum)
Ms. Jake SeatsDubose,  Thank you for coming in today. It is good to see you again.   Excellent BP and 3 lb weight loss from last visit.   Please ice and rest your knee today. You may resume normal activity tomorrow afternoon. Call if you develop redness, swelling or severe pain in L knee.   You may cancel cardiology appt. I do recommend once daily 81 mg aspirin for primary prevention of heart disease and stroke.   See me in 2 months for repeat A1c and BP check.  Dr. Armen PickupFunches

## 2014-12-15 NOTE — Progress Notes (Signed)
Pt comes in for left knee injection States right knee has improved with steroid injection Morbid obese  C/o slight SOB with walking heels  BP-141/84 97 stable on Losartan 100 mg tab

## 2014-12-15 NOTE — Telephone Encounter (Signed)
Opened in error

## 2014-12-15 NOTE — Assessment & Plan Note (Signed)
A: persistent pain, patient responded well to R knee injection  P: L knee corticosteroid injection today Weight loss Exercise  Repeat injection in 6 weeks possible

## 2014-12-19 ENCOUNTER — Ambulatory Visit: Payer: No Typology Code available for payment source | Admitting: Cardiovascular Disease

## 2014-12-26 ENCOUNTER — Telehealth: Payer: Self-pay | Admitting: *Deleted

## 2014-12-26 ENCOUNTER — Ambulatory Visit: Payer: No Typology Code available for payment source | Attending: Family Medicine | Admitting: *Deleted

## 2014-12-26 VITALS — BP 178/84 | HR 91 | Temp 98.4°F | Resp 20

## 2014-12-26 DIAGNOSIS — R252 Cramp and spasm: Secondary | ICD-10-CM | POA: Insufficient documentation

## 2014-12-26 LAB — BASIC METABOLIC PANEL
BUN: 19 mg/dL (ref 6–23)
CO2: 31 mEq/L (ref 19–32)
Calcium: 9.4 mg/dL (ref 8.4–10.5)
Chloride: 102 mEq/L (ref 96–112)
Creat: 0.77 mg/dL (ref 0.50–1.10)
Glucose, Bld: 95 mg/dL (ref 70–99)
POTASSIUM: 4.6 meq/L (ref 3.5–5.3)
SODIUM: 140 meq/L (ref 135–145)

## 2014-12-26 MED ORDER — AMLODIPINE BESYLATE 10 MG PO TABS: 10.0000 mg | ORAL_TABLET | Freq: Every day | ORAL | Status: DC

## 2014-12-26 MED ORDER — HYDROCHLOROTHIAZIDE 25 MG PO TABS: 25.0000 mg | ORAL_TABLET | Freq: Every day | ORAL | Status: DC

## 2014-12-26 NOTE — Progress Notes (Signed)
Patient presents for BP check Med list reviewed; states taking all meds as directed with the exception of lipitor. States she started omega 3 and eating more salmon and does not want to start lipitor at this time. PCP made aware Patient states she had bilateral LE cramping first few days after starting losartan; none x 7 days Discussed need for low sodium diet and using Mrs. Dash as alternative to salt  BP 178/84  P 91 R 20  T  98.4 oral SPO2  99%  Per PCP: Add HCTZ 25 mg daily BMET today Return in 2 weeks for nurse visit for BP recheck  Patient given literature on DASH Eating Plan, Diabetes and Food, Diabetes and Exercise,  And Basic Carb Counting  Patient advised to call for med refills at least 7 days before running out so as not to go without. Patient aware that she is to f/u with PCP 2 months from last visit (Due 02/13/15)

## 2014-12-26 NOTE — Patient Instructions (Signed)
Diabetes Mellitus and Food It is important for you to manage your blood sugar (glucose) level. Your blood glucose level can be greatly affected by what you eat. Eating healthier foods in the appropriate amounts throughout the day at about the same time each day will help you control your blood glucose level. It can also help slow or prevent worsening of your diabetes mellitus. Healthy eating may even help you improve the level of your blood pressure and reach or maintain a healthy weight.  HOW CAN FOOD AFFECT ME? Carbohydrates Carbohydrates affect your blood glucose level more than any other type of food. Your dietitian will help you determine how many carbohydrates to eat at each meal and teach you how to count carbohydrates. Counting carbohydrates is important to keep your blood glucose at a healthy level, especially if you are using insulin or taking certain medicines for diabetes mellitus. Alcohol Alcohol can cause sudden decreases in blood glucose (hypoglycemia), especially if you use insulin or take certain medicines for diabetes mellitus. Hypoglycemia can be a life-threatening condition. Symptoms of hypoglycemia (sleepiness, dizziness, and disorientation) are similar to symptoms of having too much alcohol.  If your health care provider has given you approval to drink alcohol, do so in moderation and use the following guidelines:  Women should not have more than one drink per day, and men should not have more than two drinks per day. One drink is equal to:  12 oz of beer.  5 oz of wine.  1 oz of hard liquor.  Do not drink on an empty stomach.  Keep yourself hydrated. Have water, diet soda, or unsweetened iced tea.  Regular soda, juice, and other mixers might contain a lot of carbohydrates and should be counted. WHAT FOODS ARE NOT RECOMMENDED? As you make food choices, it is important to remember that all foods are not the same. Some foods have fewer nutrients per serving than other  foods, even though they might have the same number of calories or carbohydrates. It is difficult to get your body what it needs when you eat foods with fewer nutrients. Examples of foods that you should avoid that are high in calories and carbohydrates but low in nutrients include:  Trans fats (most processed foods list trans fats on the Nutrition Facts label).  Regular soda.  Juice.  Candy.  Sweets, such as cake, pie, doughnuts, and cookies.  Fried foods. WHAT FOODS CAN I EAT? Have nutrient-rich foods, which will nourish your body and keep you healthy. The food you should eat also will depend on several factors, including:  The calories you need.  The medicines you take.  Your weight.  Your blood glucose level.  Your blood pressure level.  Your cholesterol level. You also should eat a variety of foods, including:  Protein, such as meat, poultry, fish, tofu, nuts, and seeds (lean animal proteins are best).  Fruits.  Vegetables.  Dairy products, such as milk, cheese, and yogurt (low fat is best).  Breads, grains, pasta, cereal, rice, and beans.  Fats such as olive oil, trans fat-free margarine, canola oil, avocado, and olives. DOES EVERYONE WITH DIABETES MELLITUS HAVE THE SAME MEAL PLAN? Because every person with diabetes mellitus is different, there is not one meal plan that works for everyone. It is very important that you meet with a dietitian who will help you create a meal plan that is just right for you. Document Released: 08/22/2005 Document Revised: 11/30/2013 Document Reviewed: 10/22/2013 [employer] Patient Information 2015 [employer]. This   information is not intended to replace advice given to you by your health care provider. Make sure you discuss any questions you have with your health care provider. Basic Carbohydrate Counting for Diabetes Mellitus Carbohydrate counting is a method for keeping track of the amount of carbohydrates you eat. Eating  carbohydrates naturally increases the level of sugar (glucose) in your blood, so it is important for you to know the amount that is okay for you to have in every meal. Carbohydrate counting helps keep the level of glucose in your blood within normal limits. The amount of carbohydrates allowed is different for every person. A dietitian can help you calculate the amount that is right for you. Once you know the amount of carbohydrates you can have, you can count the carbohydrates in the foods you want to eat. Carbohydrates are found in the following foods:  Grains, such as breads and cereals.  Dried beans and soy products.  Starchy vegetables, such as potatoes, peas, and corn.  Fruit and fruit juices.  Milk and yogurt.  Sweets and snack foods, such as cake, cookies, candy, chips, soft drinks, and fruit drinks. CARBOHYDRATE COUNTING There are two ways to count the carbohydrates in your food. You can use either of the methods or a combination of both. Reading the "Nutrition Facts" on Packaged Food The "Nutrition Facts" is an area that is included on the labels of almost all packaged food and beverages in the [place]. It includes the serving size of that food or beverage and information about the nutrients in each serving of the food, including the grams (g) of carbohydrate per serving.  Decide the number of servings of this food or beverage that you will be able to eat or drink. Multiply that number of servings by the number of grams of carbohydrate that is listed on the label for that serving. The total will be the amount of carbohydrates you will be having when you eat or drink this food or beverage. Learning Standard Serving Sizes of Food When you eat food that is not packaged or does not include "Nutrition Facts" on the label, you need to measure the servings in order to count the amount of carbohydrates.A serving of most carbohydrate-rich foods contains about 15 g of carbohydrates. The  following list includes serving sizes of carbohydrate-rich foods that provide 15 g ofcarbohydrate per serving:   1 slice of bread (1 oz) or 1 six-inch tortilla.    of a hamburger bun or English muffin.  4-6 crackers.   cup unsweetened dry cereal.    cup hot cereal.   cup rice or pasta.    cup mashed potatoes or  of a large baked potato.  1 cup fresh fruit or one small piece of fruit.    cup canned or frozen fruit or fruit juice.  1 cup milk.   cup plain fat-free yogurt or yogurt sweetened with artificial sweeteners.   cup cooked dried beans or starchy vegetable, such as peas, corn, or potatoes.  Decide the number of standard-size servings that you will eat. Multiply that number of servings by 15 (the grams of carbohydrates in that serving). For example, if you eat 2 cups of strawberries, you will have eaten 2 servings and 30 g of carbohydrates (2 servings x 15 g = 30 g). For foods such as soups and casseroles, in which more than one food is mixed in, you will need to count the carbohydrates in each food that is included. EXAMPLE OF CARBOHYDRATE   COUNTING Sample Dinner  3 oz chicken breast.   cup of brown rice.   cup of corn.  1 cup milk.   1 cup strawberries with sugar-free whipped topping.  Carbohydrate Calculation Step 1: Identify the foods that contain carbohydrates:   Rice.   Corn.   Milk.   Strawberries. Step 2:Calculate the number of servings eaten of each:   2 servings of rice.   1 serving of corn.   1 serving of milk.   1 serving of strawberries. Step 3: Multiply each of those number of servings by 15 g:   2 servings of rice x 15 g = 30 g.   1 serving of corn x 15 g = 15 g.   1 serving of milk x 15 g = 15 g.   1 serving of strawberries x 15 g = 15 g. Step 4: Add together all of the amounts to find the total grams of carbohydrates eaten: 30 g + 15 g + 15 g + 15 g = 75 g. Document Released: 11/25/2005 Document  Revised: 04/11/2014 Document Reviewed: 10/22/2013 [facility] Patient Information 2015 [place]. This information is not intended to replace advice given to you by your health care provider. Make sure you discuss any questions you have with your health care provider. Diabetes and Exercise Exercising regularly is important. It is not just about losing weight. It has many health benefits, such as:  Improving your overall fitness, flexibility, and endurance.  Increasing your bone density.  Helping with weight control.  Decreasing your body fat.  Increasing your muscle strength.  Reducing stress and tension.  Improving your overall health. People with diabetes who exercise gain additional benefits because exercise:  Reduces appetite.  Improves the body's use of blood sugar (glucose).  Helps lower or control blood glucose.  Decreases blood pressure.  Helps control blood lipids (such as cholesterol and triglycerides).  Improves the body's use of the hormone insulin by:  Increasing the body's insulin sensitivity.  Reducing the body's insulin needs.  Decreases the risk for heart disease because exercising:  Lowers cholesterol and triglycerides levels.  Increases the levels of good cholesterol (such as high-density lipoproteins [HDL]) in the body.  Lowers blood glucose levels. YOUR ACTIVITY PLAN  Choose an activity that you enjoy and set realistic goals. Your health care provider or diabetes educator can help you make an activity plan that works for you. Exercise regularly as directed by your health care provider. This includes:  Performing resistance training twice a week such as push-ups, sit-ups, lifting weights, or using resistance bands.  Performing 150 minutes of cardio exercises each week such as walking, running, or playing sports.  Staying active and spending no more than 90 minutes at one time being inactive. Even short bursts of exercise are good for you.  Three 10-minute sessions spread throughout the day are just as beneficial as a single 30-minute session. Some exercise ideas include:  Taking the dog for a walk.  Taking the stairs instead of the elevator.  Dancing to your favorite song.  Doing an exercise video.  Doing your favorite exercise with a friend. RECOMMENDATIONS FOR EXERCISING WITH TYPE 1 OR TYPE 2 DIABETES   Check your blood glucose before exercising. If blood glucose levels are greater than 240 mg/dL, check for urine ketones. Do not exercise if ketones are present.  Avoid injecting insulin into areas of the body that are going to be exercised. For example, avoid injecting insulin into:  The arms when  playing tennis.  The legs when jogging.  Keep a record of:  Food intake before and after you exercise.  Expected peak times of insulin action.  Blood glucose levels before and after you exercise.  The type and amount of exercise you have done.  Review your records with your health care provider. Your health care provider will help you to develop guidelines for adjusting food intake and insulin amounts before and after exercising.  If you take insulin or oral hypoglycemic agents, watch for signs and symptoms of hypoglycemia. They include:  Dizziness.  Shaking.  Sweating.  Chills.  Confusion.  Drink plenty of water while you exercise to prevent dehydration or heat stroke. Body water is lost during exercise and must be replaced.  Talk to your health care provider before starting an exercise program to make sure it is safe for you. Remember, almost any type of activity is better than none. Document Released: 02/15/2004 Document Revised: 04/11/2014 Document Reviewed: 05/04/2013 [facility] Patient Information 2015 [place], [place]. This information is not intended to replace advice given to you by your health care provider. Make sure you discuss any questions you have with your health care provider. DASH Eating  Plan DASH stands for "Dietary Approaches to Stop Hypertension." The DASH eating plan is a healthy eating plan that has been shown to reduce high blood pressure (hypertension). Additional health benefits may include reducing the risk of type 2 diabetes mellitus, heart disease, and stroke. The DASH eating plan may also help with weight loss. WHAT DO I NEED TO KNOW ABOUT THE DASH EATING PLAN? For the DASH eating plan, you will follow these general guidelines:  Choose foods with a percent daily value for sodium of less than 5% (as listed on the food label).  Use salt-free seasonings or herbs instead of table salt or sea salt.  Check with your health care provider or pharmacist before using salt substitutes.  Eat lower-sodium products, often labeled as "lower sodium" or "no salt added."  Eat fresh foods.  Eat more vegetables, fruits, and low-fat dairy products.  Choose whole grains. Look for the word "whole" as the first word in the ingredient list.  Choose fish and skinless chicken or [place] more often than red meat. Limit fish, poultry, and meat to 6 oz (170 g) each day.  Limit sweets, desserts, sugars, and sugary drinks.  Choose heart-healthy fats.  Limit cheese to 1 oz (28 g) per day.  Eat more home-cooked food and less restaurant, buffet, and fast food.  Limit fried foods.  Cook foods using methods other than frying.  Limit canned vegetables. If you do use them, rinse them well to decrease the sodium.  When eating at a restaurant, ask that your food be prepared with less salt, or no salt if possible. WHAT FOODS CAN I EAT? Seek help from a dietitian for individual calorie needs. Grains Whole grain or whole wheat bread. Brown rice. Whole grain or whole wheat pasta. Quinoa, bulgur, and whole grain cereals. Low-sodium cereals. Corn or whole wheat flour tortillas. Whole grain cornbread. Whole grain crackers. Low-sodium crackers. Vegetables Fresh or frozen vegetables (raw, steamed,  roasted, or grilled). Low-sodium or reduced-sodium tomato and vegetable juices. Low-sodium or reduced-sodium tomato sauce and paste. Low-sodium or reduced-sodium canned vegetables.  Fruits All fresh, canned (in natural juice), or frozen fruits. Meat and Other Protein Products Ground beef (85% or leaner), grass-fed beef, or beef trimmed of fat. Skinless chicken or [place]. Ground chicken or [place]. Pork trimmed of fat. All  fish and seafood. Eggs. Dried beans, peas, or lentils. Unsalted nuts and seeds. Unsalted canned beans. Dairy Low-fat dairy products, such as skim or 1% milk, 2% or reduced-fat cheeses, low-fat ricotta or cottage cheese, or plain low-fat yogurt. Low-sodium or reduced-sodium cheeses. Fats and Oils Tub margarines without trans fats. Light or reduced-fat mayonnaise and salad dressings (reduced sodium). Avocado. Safflower, olive, or canola oils. Natural peanut or almond butter. Other Unsalted popcorn and pretzels. The items listed above may not be a complete list of recommended foods or beverages. Contact your dietitian for more options. WHAT FOODS ARE NOT RECOMMENDED? Grains White bread. White pasta. White rice. Refined cornbread. Bagels and croissants. Crackers that contain trans fat. Vegetables Creamed or fried vegetables. Vegetables in a cheese sauce. Regular canned vegetables. Regular canned tomato sauce and paste. Regular tomato and vegetable juices. Fruits Dried fruits. Canned fruit in light or heavy syrup. Fruit juice. Meat and Other Protein Products Fatty cuts of meat. Ribs, chicken wings, bacon, sausage, bologna, salami, chitterlings, fatback, hot dogs, bratwurst, and packaged luncheon meats. Salted nuts and seeds. Canned beans with salt. Dairy Whole or 2% milk, cream, half-and-half, and cream cheese. Whole-fat or sweetened yogurt. Full-fat cheeses or blue cheese. Nondairy creamers and whipped toppings. Processed cheese, cheese spreads, or cheese curds. Condiments Onion  and garlic salt, seasoned salt, table salt, and sea salt. Canned and packaged gravies. Worcestershire sauce. Tartar sauce. Barbecue sauce. Teriyaki sauce. Soy sauce, including reduced sodium. Steak sauce. Fish sauce. Oyster sauce. Cocktail sauce. Horseradish. Ketchup and mustard. Meat flavorings and tenderizers. Bouillon cubes. Hot sauce. Tabasco sauce. Marinades. Taco seasonings. Relishes. Fats and Oils Butter, stick margarine, lard, shortening, ghee, and bacon fat. Coconut, palm kernel, or palm oils. Regular salad dressings. Other Pickles and olives. Salted popcorn and pretzels. The items listed above may not be a complete list of foods and beverages to avoid. Contact your dietitian for more information. WHERE CAN I FIND MORE INFORMATION? National Heart, Lung, and Blood Institute: [employer] Document Released: 11/14/2011 Document Revised: 04/11/2014 Document Reviewed: 09/29/2013 [facility] Patient Information 2015 [place], [place]. This information is not intended to replace advice given to you by your health care provider. Make sure you discuss any questions you have with your health care provider.

## 2014-12-26 NOTE — Telephone Encounter (Signed)
Patient [facility]ill[facility]

## 2015-01-02 ENCOUNTER — Other Ambulatory Visit: Payer: Self-pay | Admitting: Family Medicine

## 2015-01-02 NOTE — Telephone Encounter (Signed)
Refill request sent to [facility].  Martin, Tamika L, RN  

## 2015-01-06 ENCOUNTER — Telehealth: Payer: Self-pay | Admitting: Family Medicine

## 2015-01-06 DIAGNOSIS — E119 Type 2 diabetes mellitus without complications: Secondary | ICD-10-CM

## 2015-01-06 MED ORDER — TRUERESULT BLOOD GLUCOSE W/DEVICE KIT: 1.0000 | PACK | Freq: Three times a day (TID) | Status: DC

## 2015-01-06 MED ORDER — GLUCOSE BLOOD VI STRP: 1.0000 | ORAL_STRIP | Freq: Three times a day (TID) | Status: DC

## 2015-01-06 MED ORDER — TRUEPLUS LANCETS 28G MISC: 1.0000 | Freq: Three times a day (TID) | Status: DC

## 2015-01-06 NOTE — Telephone Encounter (Signed)
Sent in meter, test strips, lancets rx per request from pharmacy

## 2015-01-06 NOTE — Telephone Encounter (Signed)
Pt aware.

## 2015-01-09 ENCOUNTER — Ambulatory Visit: Payer: No Typology Code available for payment source | Attending: Family Medicine | Admitting: *Deleted

## 2015-01-09 VITALS — BP 148/96 | HR 90 | Temp 98.2°F | Resp 16

## 2015-01-09 DIAGNOSIS — Z0000 Encounter for general adult medical examination without abnormal findings: Secondary | ICD-10-CM

## 2015-01-09 DIAGNOSIS — E119 Type 2 diabetes mellitus without complications: Secondary | ICD-10-CM | POA: Insufficient documentation

## 2015-01-09 DIAGNOSIS — I10 Essential (primary) hypertension: Secondary | ICD-10-CM | POA: Insufficient documentation

## 2015-01-09 DIAGNOSIS — Z111 Encounter for screening for respiratory tuberculosis: Secondary | ICD-10-CM | POA: Insufficient documentation

## 2015-01-09 MED ORDER — HYDRALAZINE HCL 10 MG PO TABS: 10.0000 mg | ORAL_TABLET | Freq: Two times a day (BID) | ORAL | Status: DC

## 2015-01-09 MED ORDER — LOSARTAN POTASSIUM-HCTZ 50-12.5 MG PO TABS: 2.0000 | ORAL_TABLET | Freq: Every day | ORAL | Status: DC

## 2015-01-09 NOTE — Patient Instructions (Signed)
DASH Eating Plan °DASH stands for "Dietary Approaches to Stop Hypertension." The DASH eating plan is a healthy eating plan that has been shown to reduce high blood pressure (hypertension). Additional health benefits may include reducing the risk of type 2 diabetes mellitus, heart disease, and stroke. The DASH eating plan may also help with weight loss. °WHAT DO I NEED TO KNOW ABOUT THE DASH EATING PLAN? °For the DASH eating plan, you will follow these general guidelines: °· Choose foods with a percent daily value for sodium of less than 5% (as listed on the food label). °· Use salt-free seasonings or herbs instead of table salt or sea salt. °· Check with your health care provider or pharmacist before using salt substitutes. °· Eat lower-sodium products, often labeled as "lower sodium" or "no salt added." °· Eat fresh foods. °· Eat more vegetables, fruits, and low-fat dairy products. °· Choose whole grains. Look for the word "whole" as the first word in the ingredient list. °· Choose fish and skinless chicken or turkey more often than red meat. Limit fish, poultry, and meat to 6 oz (170 g) each day. °· Limit sweets, desserts, sugars, and sugary drinks. °· Choose heart-healthy fats. °· Limit cheese to 1 oz (28 g) per day. °· Eat more home-cooked food and less restaurant, buffet, and fast food. °· Limit fried foods. °· Cook foods using methods other than frying. °· Limit canned vegetables. If you do use them, rinse them well to decrease the sodium. °· When eating at a restaurant, ask that your food be prepared with less salt, or no salt if possible. °WHAT FOODS CAN I EAT? °Seek help from a dietitian for individual calorie needs. °Grains °Whole grain or whole wheat bread. Brown rice. Whole grain or whole wheat pasta. Quinoa, bulgur, and whole grain cereals. Low-sodium cereals. Corn or whole wheat flour tortillas. Whole grain cornbread. Whole grain crackers. Low-sodium crackers. °Vegetables °Fresh or frozen vegetables  (raw, steamed, roasted, or grilled). Low-sodium or reduced-sodium tomato and vegetable juices. Low-sodium or reduced-sodium tomato sauce and paste. Low-sodium or reduced-sodium canned vegetables.  °Fruits °All fresh, canned (in natural juice), or frozen fruits. °Meat and Other Protein Products °Ground beef (85% or leaner), grass-fed beef, or beef trimmed of fat. Skinless chicken or turkey. Ground chicken or turkey. Pork trimmed of fat. All fish and seafood. Eggs. Dried beans, peas, or lentils. Unsalted nuts and seeds. Unsalted canned beans. °Dairy °Low-fat dairy products, such as skim or 1% milk, 2% or reduced-fat cheeses, low-fat ricotta or cottage cheese, or plain low-fat yogurt. Low-sodium or reduced-sodium cheeses. °Fats and Oils °Tub margarines without trans fats. Light or reduced-fat mayonnaise and salad dressings (reduced sodium). Avocado. Safflower, olive, or canola oils. Natural peanut or almond butter. °Other °Unsalted popcorn and pretzels. °The items listed above may not be a complete list of recommended foods or beverages. Contact your dietitian for more options. °WHAT FOODS ARE NOT RECOMMENDED? °Grains °White bread. White pasta. White rice. Refined cornbread. Bagels and croissants. Crackers that contain trans fat. °Vegetables °Creamed or fried vegetables. Vegetables in a cheese sauce. Regular canned vegetables. Regular canned tomato sauce and paste. Regular tomato and vegetable juices. °Fruits °Dried fruits. Canned fruit in light or heavy syrup. Fruit juice. °Meat and Other Protein Products °Fatty cuts of meat. Ribs, chicken wings, bacon, sausage, bologna, salami, chitterlings, fatback, hot dogs, bratwurst, and packaged luncheon meats. Salted nuts and seeds. Canned beans with salt. °Dairy °Whole or 2% milk, cream, half-and-half, and cream cheese. Whole-fat or sweetened yogurt. Full-fat   cheeses or blue cheese. Nondairy creamers and whipped toppings. Processed cheese, cheese spreads, or cheese  curds. °Condiments °Onion and garlic salt, seasoned salt, table salt, and sea salt. Canned and packaged gravies. Worcestershire sauce. Tartar sauce. Barbecue sauce. Teriyaki sauce. Soy sauce, including reduced sodium. Steak sauce. Fish sauce. Oyster sauce. Cocktail sauce. Horseradish. Ketchup and mustard. Meat flavorings and tenderizers. Bouillon cubes. Hot sauce. Tabasco sauce. Marinades. Taco seasonings. Relishes. °Fats and Oils °Butter, stick margarine, lard, shortening, ghee, and bacon fat. Coconut, palm kernel, or palm oils. Regular salad dressings. °Other °Pickles and olives. Salted popcorn and pretzels. °The items listed above may not be a complete list of foods and beverages to avoid. Contact your dietitian for more information. °WHERE CAN I FIND MORE INFORMATION? °National Heart, Lung, and Blood Institute: www.nhlbi.nih.gov/health/health-topics/topics/dash/ °Document Released: 11/14/2011 Document Revised: 04/11/2014 Document Reviewed: 09/29/2013 °ExitCare® Patient Information ©2015 [employer], [employer]. This information is not intended to replace advice given to you by your health care provider. Make sure you discuss any questions you have with your health care provider. ° °

## 2015-01-09 NOTE — Progress Notes (Signed)
Patient presents for BP check and PPD placement Med list reviewed; states taking all meds as directed States she is trying to walk after work but is not consistent yet Discussed need for low sodium diet and using Mrs. Dash as alternative to salt. States she does not add salt or cook with salt. Reminded to read labels and choose foods with < 5% daily value of sodium Patient given literature on DASH Eating Plan again  BP 148/96 P 90 R  16  T  98.2 oral SPO2 98%  Wt 320 lb  PPD placed left FA for work  Per PCP: D/C losartan D/C HCTZ Start hyzaar 50-12.5 2 tabs daily Add hydralazine 10 mg bid [facility]  Return in 2 weeks for nurse visit for BP check  Patient advised to call for med refills at least 7 days before running out so as not to go without.

## 2015-01-11 ENCOUNTER — Ambulatory Visit: Payer: No Typology Code available for payment source | Attending: Internal Medicine

## 2015-01-11 LAB — TB SKIN TEST
INDURATION: 0 mm
TB SKIN TEST: NEGATIVE

## 2015-01-11 MED ORDER — METFORMIN HCL ER 500 MG PO TB24: 1000.0000 mg | ORAL_TABLET | Freq: Every day | ORAL | Status: DC

## 2015-01-11 NOTE — Progress Notes (Unsigned)
Pt is here for a PPD reading.

## 2015-01-26 ENCOUNTER — Ambulatory Visit: Payer: Self-pay | Attending: Family Medicine | Admitting: *Deleted

## 2015-01-26 VITALS — BP 145/90 | HR 84 | Temp 98.0°F | Resp 20

## 2015-01-26 DIAGNOSIS — I152 Hypertension secondary to endocrine disorders: Secondary | ICD-10-CM

## 2015-01-26 DIAGNOSIS — E119 Type 2 diabetes mellitus without complications: Secondary | ICD-10-CM | POA: Insufficient documentation

## 2015-01-26 DIAGNOSIS — I10 Essential (primary) hypertension: Secondary | ICD-10-CM | POA: Insufficient documentation

## 2015-01-26 NOTE — Progress Notes (Signed)
Patient presents for BP check Med list reviewed; states taking all meds as directed except has only been taking hydralazine once per day instead of bid. Has only taken one day in last 7 days States she has been trying to walk more for exercise C/o "dizzy spells' while sitting, lasting seconds. 3 episodes since last visit   BP 145/90 P 84 R 20  T  98.o oral SPO2  97%  Per PCP: Take hydralazine 10 mg once daily consistently F/u with PCP  Patient advised to call for med refills at least 7 days before running out so as not to go without. Patient aware that she is to f/u with PCP 3 months from last visit (Due 02/13/15)

## 2015-04-11 ENCOUNTER — Other Ambulatory Visit: Payer: Self-pay | Admitting: Family Medicine

## 2015-04-12 ENCOUNTER — Other Ambulatory Visit: Payer: Self-pay | Admitting: Family Medicine

## 2015-06-01 ENCOUNTER — Other Ambulatory Visit: Payer: Self-pay | Admitting: Family Medicine

## 2015-06-02 ENCOUNTER — Other Ambulatory Visit: Payer: Self-pay | Admitting: Family Medicine

## 2015-06-02 ENCOUNTER — Other Ambulatory Visit: Payer: Self-pay

## 2015-06-02 DIAGNOSIS — E119 Type 2 diabetes mellitus without complications: Secondary | ICD-10-CM

## 2015-06-02 MED ORDER — METFORMIN HCL ER 500 MG PO TB24: 1000.0000 mg | ORAL_TABLET | Freq: Every day | ORAL | Status: DC

## 2015-06-02 NOTE — Telephone Encounter (Signed)
Nurse called patient, patient verified date of birth. Patient aware of metformin sent to pharmacy for one month supply. Patient needs appointment before getting more refills.  Patient has not been to see provider due to loss of insurance. Patient transferred to front office staff to make appointment for HTN and DM routine checkup and to make appointment with financial. Patient voices understanding and has no further questions.

## 2015-06-02 NOTE — Telephone Encounter (Signed)
Patient called requesting medication refill on metFORMIN (GLUCOPHAGE XR) 500 MG 24 hr tablet . Patient states she is out of medication,please f/u

## 2015-06-05 ENCOUNTER — Other Ambulatory Visit: Payer: Self-pay | Admitting: *Deleted

## 2015-06-05 DIAGNOSIS — E119 Type 2 diabetes mellitus without complications: Secondary | ICD-10-CM

## 2015-06-05 MED ORDER — METFORMIN HCL ER 500 MG PO TB24: 1000.0000 mg | ORAL_TABLET | Freq: Every day | ORAL | Status: DC

## 2015-06-22 ENCOUNTER — Ambulatory Visit: Payer: Self-pay | Attending: Family Medicine | Admitting: Family Medicine

## 2015-06-22 ENCOUNTER — Encounter: Payer: Self-pay | Admitting: Family Medicine

## 2015-06-22 VITALS — BP 149/98 | HR 97 | Temp 98.1°F | Resp 18 | Ht 62.5 in | Wt 321.0 lb

## 2015-06-22 DIAGNOSIS — Z0000 Encounter for general adult medical examination without abnormal findings: Secondary | ICD-10-CM | POA: Insufficient documentation

## 2015-06-22 DIAGNOSIS — E119 Type 2 diabetes mellitus without complications: Secondary | ICD-10-CM | POA: Insufficient documentation

## 2015-06-22 DIAGNOSIS — I152 Hypertension secondary to endocrine disorders: Secondary | ICD-10-CM

## 2015-06-22 DIAGNOSIS — I10 Essential (primary) hypertension: Secondary | ICD-10-CM | POA: Insufficient documentation

## 2015-06-22 LAB — GLUCOSE, POCT (MANUAL RESULT ENTRY): POC GLUCOSE: 92 mg/dL (ref 70–99)

## 2015-06-22 LAB — POCT GLYCOSYLATED HEMOGLOBIN (HGB A1C): HEMOGLOBIN A1C: 6.3

## 2015-06-22 MED ORDER — LOSARTAN POTASSIUM-HCTZ 50-12.5 MG PO TABS: 2.0000 | ORAL_TABLET | Freq: Every day | ORAL | Status: DC

## 2015-06-22 NOTE — Progress Notes (Signed)
F/U DM HTN Glucose running 93-123 Stated after INJ no knee pain, walking around 10 min since yesterday

## 2015-06-22 NOTE — Patient Instructions (Addendum)
Ms. Jake SeatsDubose,   Thank you for coming in today  1. Diabetes well controlled Continue current regimen  2. HTN BP elevated Start hyzaar  BP medicine: Hyzaar in morning  Amlodipine in morning  Hydralazine 10 mg in morning   Hydralazine 10 mg in afternoon   F/u with RN in 4 weeks for BP with RN  F/u with me in 3 months for HTN and diabetes  Dr. Armen PickupFunches

## 2015-06-22 NOTE — Progress Notes (Signed)
   Subjective:    Patient ID: Linda CleaverVictoria J Cooper, female    DOB: 1961-04-09, 54 y.o.   MRN: [id number] CC; f/u DM2 and HTN HPI  1. CHRONIC HYPERTENSION  Disease Monitoring  Blood pressure range: 150/90s  Chest pain: no   Dyspnea: yes   Claudication: no   Medication compliance: yes,  but is not taking Hyzaar, just takng losartan 50 mg, norvasc 10 mg and hydralazine 10 mg BID    Medication Side Effects  Lightheadedness: no   Urinary frequency: no   Edema: yes      Preventitive Healthcare:  Exercise: yes, minial    Diet Pattern: regular meals   Salt Restriction: sometimes but often eats out   2. CHRONIC DIABETES  Disease Monitoring  Blood Sugar Ranges: 93-120  Polyuria: no   Visual problems: no   Medication Compliance: yes, Medication Side Effects  Hypoglycemia: no   Soc Hx: non smoker  Review of Systems  Constitutional: Negative for fever and chills.  Respiratory: Negative for shortness of breath.   Cardiovascular: Negative for chest pain.  Gastrointestinal: Negative for abdominal pain and blood in stool.  Skin: Negative for rash.  Psychiatric/Behavioral: Negative for suicidal ideas and dysphoric mood.       Objective:   Physical Exam BP 149/98 mmHg  Pulse 97  Temp(Src) 98.1 F (36.7 C) (Oral)  Resp 18  Ht 5' 2.5" (1.588 m)  Wt 321 lb (145.605 kg)  BMI 57.74 kg/m2  SpO2 98%  BP Readings from Last 3 Encounters:  06/22/15 149/98  01/26/15 145/90  01/09/15 148/96   Wt Readings from Last 3 Encounters:  06/22/15 321 lb (145.605 kg)  12/15/14 328 lb (148.78 kg)  12/05/14 331 lb (150.141 kg)  General appearance: alert, cooperative and no distress Lungs: clear to auscultation bilaterally Heart: regular rate and rhythm, S1, S2 normal, no murmur, click, rub or gallop Extremities: edema trace b/l LE   Lab Results  Component Value Date   HGBA1C 6.30 06/22/2015   CBG 92      Assessment & Plan:

## 2015-07-03 ENCOUNTER — Ambulatory Visit: Payer: Self-pay | Attending: Family Medicine

## 2015-07-20 ENCOUNTER — Other Ambulatory Visit: Payer: Self-pay | Admitting: Family Medicine

## 2015-07-21 ENCOUNTER — Ambulatory Visit: Payer: Self-pay | Attending: Family Medicine | Admitting: *Deleted

## 2015-07-21 VITALS — BP 138/87 | HR 83 | Temp 97.9°F | Resp 18 | Ht 62.0 in | Wt 321.4 lb

## 2015-07-21 DIAGNOSIS — I152 Hypertension secondary to endocrine disorders: Secondary | ICD-10-CM | POA: Insufficient documentation

## 2015-07-21 NOTE — Progress Notes (Addendum)
Patient presents for BP check after increasing hyzaar to 100-25 mg daily Patient is not adding salt to foods but is still cooking with salt. Patient is aware of Mrs Sharilyn Sites as alternative to salt.  Walking 20 minutes per day 2-3 days per week for exercise. Unable to do more due to knee pain Patient would like to receive knee injections as in past but is concerned about cost. Financial counselor in to discuss Patient denies headaches, blurred vision, chest pain  Positive for [facility] with walking long distances or up stairs C/o LE edema; elevating LE at bedtime (patient works and takes classes in evenings) Patient states AM fasting BS ranging 87-116  Filed Vitals:   07/21/15 1536  BP: 138/87  Pulse: 83  Temp: 97.9 F (36.6 C)  Resp: 18    Patient advised to call for med refills at least 7 days before running out so as not to go without.  Patient aware that she is to f/u with PCP 3 months from last visit (Due 09/22/15)  Patient given literature on [employer] for customized diet and lifestyle support

## 2015-07-28 ENCOUNTER — Other Ambulatory Visit: Payer: Self-pay | Admitting: Family Medicine

## 2015-08-02 NOTE — Telephone Encounter (Signed)
Patient called to request a med refill for amLODipine (NORVASC) 10 MG tablet. Please f/u with pt. °

## 2015-08-08 ENCOUNTER — Telehealth: Payer: Self-pay | Admitting: Clinical

## 2015-08-08 NOTE — Telephone Encounter (Signed)
Returned pt inquiry call, pt informed that the self-pay cost for corticosteroid shots will be $181.00. Pt states that she will call to schedule an appointment when she has the funds to pay for the service.

## 2015-09-01 ENCOUNTER — Ambulatory Visit: Payer: Self-pay | Attending: Family Medicine | Admitting: Family Medicine

## 2015-09-01 ENCOUNTER — Encounter: Payer: Self-pay | Admitting: Family Medicine

## 2015-09-01 VITALS — BP 167/94 | HR 94 | Temp 98.5°F | Resp 16 | Ht 62.0 in | Wt 323.0 lb

## 2015-09-01 DIAGNOSIS — E119 Type 2 diabetes mellitus without complications: Secondary | ICD-10-CM | POA: Insufficient documentation

## 2015-09-01 DIAGNOSIS — I152 Hypertension secondary to endocrine disorders: Secondary | ICD-10-CM | POA: Insufficient documentation

## 2015-09-01 DIAGNOSIS — M170 Bilateral primary osteoarthritis of knee: Secondary | ICD-10-CM | POA: Insufficient documentation

## 2015-09-01 MED ORDER — METHYLPREDNISOLONE ACETATE 40 MG/ML IJ SUSP
40.0000 mg | Freq: Once | INTRAMUSCULAR | Status: AC
  Administered 2015-09-01: 40 mg via INTRA_ARTICULAR

## 2015-09-01 MED ORDER — METFORMIN HCL ER 500 MG PO TB24: 1000.0000 mg | ORAL_TABLET | Freq: Every day | ORAL | Status: DC

## 2015-09-01 MED ORDER — HYDRALAZINE HCL 25 MG PO TABS: 25.0000 mg | ORAL_TABLET | Freq: Two times a day (BID) | ORAL | Status: DC

## 2015-09-01 NOTE — Patient Instructions (Signed)
Linda Cooper,  You have received a shot of steroid in your knees today. Rest and ice knee today. Regular activity tomorrow. Look out for redness, swelling, fever,severe pain in joint and call if you experience these symptoms.  [name] was seen today for follow-up and injections.  Diagnoses and all orders for this visit:  Hypertension due to endocrine disorder -     hydrALAZINE (APRESOLINE) 25 MG tablet; Take 1 tablet (25 mg total) by mouth 2 (two) times daily.  Primary osteoarthritis of both knees -     DG Knee Bilateral Standing AP; Future -     methylPREDNISolone acetate (DEPO-MEDROL) injection 40 mg; Inject 1 mL (40 mg total) into the articular space once. -     methylPREDNISolone acetate (DEPO-MEDROL) injection 40 mg; Inject 1 mL (40 mg total) into the articular space once.   F/u in 4 weeks with RN/pharmacy team for BP check F/u with me in 2-3 months for HTN   Dr. Armen Pickup

## 2015-09-01 NOTE — Progress Notes (Signed)
Patient ID: Linda Cooper, female   DOB: 04-02-1961, 54 y.o.   MRN: [id number]   Subjective:  Patient ID: Linda Cooper, female    DOB: Apr 16, 1961  Age: 54 y.o. MRN: [id number]  CC: Follow-up and Injections   HPI [name] presents for   1. B/l knee pain: Would like b/l  Knee corticosteroid injection. Has injections 9 months ago and did well until 2 weeks ago.  Swelling in R medial knee. Severe pain in knee. No fever or redness. No trauma. Morbidly obese.   2. HTN: compliant with all meds except norvasc. Would like to continue off norvasc due to peripheral edema. No HA, CP, SOB.    Outpatient Prescriptions Prior to Visit  Medication Sig Dispense Refill  . albuterol (PROVENTIL HFA;VENTOLIN HFA) 108 (90 BASE) MCG/ACT inhaler Inhale 2 puffs into the lungs every 4 (four) hours as needed for wheezing. 1 Inhaler 0  . amLODipine (NORVASC) 10 MG tablet Take 1 tablet (10 mg total) by mouth daily. 90 tablet 1  . aspirin EC 81 MG tablet Take 1 tablet (81 mg total) by mouth daily.    Marland Kitchen atorvastatin (LIPITOR) 40 MG tablet Take 1 tablet (40 mg total) by mouth daily. 90 tablet 3  . Blood Glucose Monitoring Suppl (TRUERESULT BLOOD GLUCOSE) W/DEVICE KIT 1 each by Does not apply route 3 (three) times daily before meals. 1 each 0  . glucose blood (TRUETEST TEST) test strip 1 each by Other route 3 (three) times daily. Use as instructed 100 each 12  . hydrALAZINE (APRESOLINE) 10 MG tablet TAKE 1 TABLET BY MOUTH 2 TIMES DAILY 60 tablet 2  . losartan-hydrochlorothiazide (HYZAAR) 50-12.5 MG per tablet Take 2 tablets by mouth daily. 60 tablet 5  . metFORMIN (GLUCOPHAGE XR) 500 MG 24 hr tablet Take 2 tablets (1,000 mg total) by mouth daily after supper. 60 tablet 0  . Multiple Vitamins-Minerals (MULTIVITAMIN WITH MINERALS) tablet Take 1 tablet by mouth daily.    . naproxen sodium (ANAPROX) 220 MG tablet Take 440 mg by mouth 2 (two) times daily with a meal.    . Omega 3 1200 MG CAPS Take by mouth.      . oxybutynin (OXYTROL) 3.9 MG/24HR Place 1 patch onto the skin every 3 (three) days.    . TRUEPLUS LANCETS 28G MISC 1 each by Does not apply route 3 (three) times daily. 100 each 12  . vitamin B-12 (CYANOCOBALAMIN) 1000 MCG tablet Take 1,000 mcg by mouth daily.    . Vitamin D, Ergocalciferol, (DRISDOL) 50000 UNITS CAPS capsule Take 50,000 Units by mouth every 7 (seven) days.     No facility-administered medications prior to visit.   Social History  Substance Use Topics  . Smoking status: Never Smoker   . Smokeless tobacco: Never Used  . Alcohol Use: No   ROS Review of Systems  Constitutional: Negative for fever and chills.  Eyes: Negative for visual disturbance.  Respiratory: Negative for shortness of breath.   Cardiovascular: Negative for chest pain.  Gastrointestinal: Negative for abdominal pain and blood in stool.  Musculoskeletal: Positive for joint swelling and arthralgias. Negative for back pain.  Skin: Negative for rash.  Allergic/Immunologic: Negative for immunocompromised state.  Hematological: Negative for adenopathy. Does not bruise/bleed easily.  Psychiatric/Behavioral: Negative for suicidal ideas and dysphoric mood.    Objective:  BP 167/94 mmHg  Pulse 94  Temp(Src) 98.5 F (36.9 C)  Resp 16  Ht _0  (1.575 m)  Wt 323 lb (146.512 kg)  BMI 59.06 kg/m2  SpO2 99%  BP/Weight 09/01/2015 07/21/2015 03/12/5912  Systolic BP 685 992 341  Diastolic BP 94 87 98  Wt. (Lbs) 323 321.4 321  BMI 59.06 58.77 57.74   Physical Exam  Constitutional: She is oriented to person, place, and time. She appears well-developed and well-nourished. No distress.  Morbidly obese   HENT:  Head: Normocephalic and atraumatic.  Pulmonary/Chest: Effort normal.  Musculoskeletal: She exhibits tenderness. She exhibits no edema.       Right knee: She exhibits decreased range of motion, swelling and effusion. She exhibits no erythema.       Left knee: She exhibits decreased range of motion.  She exhibits no swelling, no effusion and no erythema.  Neurological: She is alert and oriented to person, place, and time.  Skin: Skin is warm and dry. No rash noted.  Psychiatric: She has a normal mood and affect.   After obtaining informed consent and cleaning the skin using iodine and alcohol a  steroid injection was performed at both knees using lateral approach using 1% plain Lidocaine and 40 mg of Depo Medrol. This was well tolerated  Assessment & Plan:   Problem List Items Addressed This Visit    Degenerative arthritis of knee, bilateral (Chronic)   Relevant Medications   methylPREDNISolone acetate (DEPO-MEDROL) injection 40 mg (Completed)   methylPREDNISolone acetate (DEPO-MEDROL) injection 40 mg (Completed)   Other Relevant Orders   DG Knee Bilateral Standing AP   Diabetes mellitus without complication (Chronic)   Relevant Medications   metFORMIN (GLUCOPHAGE XR) 500 MG 24 hr tablet   Hypertension - Primary (Chronic)   Relevant Medications   hydrALAZINE (APRESOLINE) 25 MG tablet      No orders of the defined types were placed in this encounter.    Follow-up: No Follow-up on file.   Boykin Nearing MD

## 2015-09-01 NOTE — Progress Notes (Signed)
RT knee INJ Stated shooting pain. Pain Worsen at night  Knee swelling  Pain scale #8

## 2015-10-03 ENCOUNTER — Other Ambulatory Visit: Payer: Self-pay | Admitting: Pharmacist

## 2015-10-03 MED ORDER — TRUE METRIX METER W/DEVICE KIT: PACK | Status: DC

## 2015-10-03 MED ORDER — GLUCOSE BLOOD VI STRP: ORAL_STRIP | Status: DC

## 2015-12-14 MED FILL — hydrALAZINE HCL 25 MG TABS: 25 | 30 days supply | Qty: 60 | Fill #2

## 2015-12-14 MED FILL — LOSARTAN-HCTZ 100-25 MG TAB: 100-25 | 30 days supply | Qty: 30 | Fill #4

## 2015-12-14 MED FILL — METFORMIN HCL ER 500 MG TAB: 500 | 30 days supply | Qty: 60 | Fill #2

## 2015-12-25 ENCOUNTER — Ambulatory Visit: Payer: BLUE CROSS/BLUE SHIELD | Attending: Family Medicine

## 2015-12-25 VITALS — BP 140/98 | HR 87 | Temp 98.3°F | Resp 18 | Ht 62.0 in | Wt 327.0 lb

## 2015-12-25 DIAGNOSIS — Z0000 Encounter for general adult medical examination without abnormal findings: Secondary | ICD-10-CM | POA: Insufficient documentation

## 2015-12-25 NOTE — Progress Notes (Signed)
Patient here for ppd injection. Denies fever.  Patient reports bilateral knee pain, currently at level 7, described as aching, throbbing, shooting, stabbing. Patient reports Dr. Armen PickupFunches is aware of knee pain and will be seeing provider for appointment soon.   Patient reports feeling like she has been having indigestion for about a week. It starts after she eats dinner and has awaken her from sleep one night. Patient denies chest pain. Patient denies chest pains, indigestion or chest tightness at this time. Nurse encouraged patient to make an appointment with Dr. Armen PickupFunches to be seen.   Patients BP is 176/135 in left arm, automatic. Patients BP is 154/112, in left arm, manual. Patients BP 140/98, in left arm, manual.   Patient reports not taking BP medications today. Patient reports she will take BP medication as soon as she gets home and she explains her BP will go down.

## 2015-12-27 ENCOUNTER — Ambulatory Visit: Payer: BLUE CROSS/BLUE SHIELD | Attending: Family Medicine

## 2015-12-27 DIAGNOSIS — Z0000 Encounter for general adult medical examination without abnormal findings: Secondary | ICD-10-CM

## 2015-12-27 LAB — TB SKIN TEST
INDURATION: 0 mm
TB SKIN TEST: NEGATIVE

## 2015-12-27 NOTE — Progress Notes (Signed)
Patient here for ppd read, left forearm. Tb skin test is negative.

## 2016-01-12 ENCOUNTER — Encounter: Payer: Self-pay | Admitting: Family Medicine

## 2016-01-12 ENCOUNTER — Ambulatory Visit: Payer: BLUE CROSS/BLUE SHIELD | Attending: Family Medicine | Admitting: Family Medicine

## 2016-01-12 ENCOUNTER — Other Ambulatory Visit: Payer: Self-pay | Admitting: Family Medicine

## 2016-01-12 VITALS — BP 132/84 | HR 102 | Temp 98.4°F | Resp 16 | Ht 62.0 in | Wt 322.0 lb

## 2016-01-12 DIAGNOSIS — I152 Hypertension secondary to endocrine disorders: Secondary | ICD-10-CM

## 2016-01-12 DIAGNOSIS — Z6841 Body Mass Index (BMI) 40.0 and over, adult: Secondary | ICD-10-CM | POA: Diagnosis not present

## 2016-01-12 DIAGNOSIS — I10 Essential (primary) hypertension: Secondary | ICD-10-CM | POA: Insufficient documentation

## 2016-01-12 DIAGNOSIS — E119 Type 2 diabetes mellitus without complications: Secondary | ICD-10-CM | POA: Insufficient documentation

## 2016-01-12 DIAGNOSIS — Z114 Encounter for screening for human immunodeficiency virus [HIV]: Secondary | ICD-10-CM | POA: Diagnosis not present

## 2016-01-12 DIAGNOSIS — M1710 Unilateral primary osteoarthritis, unspecified knee: Secondary | ICD-10-CM | POA: Insufficient documentation

## 2016-01-12 DIAGNOSIS — M170 Bilateral primary osteoarthritis of knee: Secondary | ICD-10-CM

## 2016-01-12 DIAGNOSIS — Z79899 Other long term (current) drug therapy: Secondary | ICD-10-CM | POA: Diagnosis not present

## 2016-01-12 DIAGNOSIS — Z7982 Long term (current) use of aspirin: Secondary | ICD-10-CM | POA: Insufficient documentation

## 2016-01-12 DIAGNOSIS — G8929 Other chronic pain: Secondary | ICD-10-CM | POA: Diagnosis not present

## 2016-01-12 DIAGNOSIS — Z1159 Encounter for screening for other viral diseases: Secondary | ICD-10-CM

## 2016-01-12 DIAGNOSIS — Z7984 Long term (current) use of oral hypoglycemic drugs: Secondary | ICD-10-CM | POA: Insufficient documentation

## 2016-01-12 LAB — GLUCOSE, POCT (MANUAL RESULT ENTRY): POC Glucose: 115 mg/dl — AB (ref 70–99)

## 2016-01-12 LAB — POCT GLYCOSYLATED HEMOGLOBIN (HGB A1C): HEMOGLOBIN A1C: 6.5

## 2016-01-12 MED ORDER — METHYLPREDNISOLONE ACETATE 40 MG/ML IJ SUSP
40.0000 mg | Freq: Once | INTRAMUSCULAR | Status: AC
  Administered 2016-01-12: 40 mg via INTRA_ARTICULAR

## 2016-01-12 NOTE — Progress Notes (Signed)
Subjective:  Patient ID: Linda Cooper, female    DOB: 1961-02-05  Age: 55 y.o. MRN: [id number]  CC: Diabetes and Knee Pain   HPI [name] has diabetes, HTN, morbid obesity, knee pain   1. CHRONIC DIABETES  Disease Monitoring  Blood Sugar Ranges:   Polyuria: yes, every 2 hrs    Visual problems: no   Medication Compliance: yes  Medication Side Effects  Hypoglycemia: no   2. Bilateral knee pain: chronic bilateral knee pain in setting of morbid obesity. We have been treating with knee injections. Last set was 5 months ago. The injection in the L knee helped for 2 weeks. She reports severe L knee pain. Pain worsened 2 months ago. She denies injury. She take naproxen 440 mg daily for pain and wears a knee brace.   3. Morbid obesity: for most of her life. She lost 40 # on weight watchers some years ago. She is now eating for her blood type. She is limited by her chronic knee pain and does very little exercise. She is interested in weight loss surgery.   Social History  Substance Use Topics  . Smoking status: Never Smoker   . Smokeless tobacco: Never Used  . Alcohol Use: No    Outpatient Prescriptions Prior to Visit  Medication Sig Dispense Refill  . albuterol (PROVENTIL HFA;VENTOLIN HFA) 108 (90 BASE) MCG/ACT inhaler Inhale 2 puffs into the lungs every 4 (four) hours as needed for wheezing. 1 Inhaler 0  . amLODipine (NORVASC) 10 MG tablet TAKE 1 TABLET BY MOUTH DAILY (Patient not taking: Reported on 12/25/2015) 90 tablet 1  . aspirin EC 81 MG tablet Take 1 tablet (81 mg total) by mouth daily.    Marland Kitchen atorvastatin (LIPITOR) 40 MG tablet Take 1 tablet (40 mg total) by mouth daily. (Patient not taking: Reported on 12/25/2015) 90 tablet 3  . Blood Glucose Monitoring Suppl (TRUE METRIX METER) W/DEVICE KIT USE TO TEST 3 TIMES DAILY 1 kit 0  . glucose blood (TRUE METRIX BLOOD GLUCOSE TEST) test strip USE TO TEST 3 TIMES DAILY 100 each 12  . hydrALAZINE (APRESOLINE) 25 MG tablet  Take 1 tablet (25 mg total) by mouth 2 (two) times daily. 60 tablet 5  . losartan-hydrochlorothiazide (HYZAAR) 50-12.5 MG per tablet Take 2 tablets by mouth daily. 60 tablet 5  . metFORMIN (GLUCOPHAGE XR) 500 MG 24 hr tablet Take 2 tablets (1,000 mg total) by mouth daily after supper. 60 tablet 3  . Multiple Vitamins-Minerals (MULTIVITAMIN WITH MINERALS) tablet Take 1 tablet by mouth daily.    . naproxen sodium (ANAPROX) 220 MG tablet Take 440 mg by mouth 2 (two) times daily with a meal.    . Omega 3 1200 MG CAPS Take by mouth.    . oxybutynin (OXYTROL) 3.9 MG/24HR Place 1 patch onto the skin every 3 (three) days. Reported on 12/25/2015    . TRUEPLUS LANCETS 28G MISC 1 each by Does not apply route 3 (three) times daily. 100 each 12  . vitamin B-12 (CYANOCOBALAMIN) 1000 MCG tablet Take 1,000 mcg by mouth daily.    . Vitamin D, Ergocalciferol, (DRISDOL) 50000 UNITS CAPS capsule Take 50,000 Units by mouth every 7 (seven) days.     No facility-administered medications prior to visit.    ROS Review of Systems  Constitutional: Negative for fever and chills.  Eyes: Negative for visual disturbance.  Respiratory: Negative for shortness of breath.   Cardiovascular: Negative for chest pain.  Gastrointestinal: Negative for abdominal  pain and blood in stool.  Musculoskeletal: Positive for joint swelling and arthralgias. Negative for back pain.  Skin: Negative for rash.  Allergic/Immunologic: Negative for immunocompromised state.  Hematological: Negative for adenopathy. Does not bruise/bleed easily.  Psychiatric/Behavioral: Negative for suicidal ideas and dysphoric mood.    Objective:  BP 132/84 mmHg  Pulse 102  Temp(Src) 98.4 F (36.9 C) (Oral)  Resp 16  Ht 5' 2"  (1.575 m)  Wt 322 lb (146.058 kg)  BMI 58.88 kg/m2  SpO2 98%  BP/Weight 01/12/2016 12/25/2015 1/63/8466  Systolic BP 599 357 017  Diastolic BP 84 98 94  Wt. (Lbs) 322 327 323  BMI 58.88 59.79 59.06   Physical Exam    Constitutional: She is oriented to person, place, and time. She appears well-developed and well-nourished. No distress.  Morbidly obese   HENT:  Head: Normocephalic and atraumatic.  Cardiovascular: Normal rate, regular rhythm, normal heart sounds and intact distal pulses.   Pulmonary/Chest: Effort normal and breath sounds normal.  Musculoskeletal: She exhibits tenderness. She exhibits no edema.       Right knee: She exhibits decreased range of motion, swelling and effusion. She exhibits no erythema.       Left knee: She exhibits decreased range of motion. She exhibits no swelling, no effusion and no erythema.  Neurological: She is alert and oriented to person, place, and time.  Skin: Skin is warm and dry. No rash noted.  Psychiatric: She has a normal mood and affect.   Lab Results  Component Value Date   HGBA1C 6.30 06/22/2015   CBG 115  After obtaining informed consent and cleaning the skin using iodine and alcohol a  steroid injection was performed at R knee using 1% plain Lidocaine and 40 mg of Depo Medrol . This was well tolerated  Assessment & Plan:   Problem List Items Addressed This Visit    Morbid obesity ([place])   Relevant Orders   Ambulatory referral to General Surgery   ABO (Completed)   Diabetes mellitus without complication ([place]) - Primary (Chronic)   Relevant Orders   HgB A1c (Completed)   Glucose (CBG) (Completed)   Degenerative arthritis of knee, bilateral (Chronic)   Relevant Medications   methylPREDNISolone acetate (DEPO-MEDROL) injection 40 mg (Completed)   Other Relevant Orders   Ambulatory referral to Orthopedic Surgery    Other Visit Diagnoses    Screening for HIV (human immunodeficiency virus)        Relevant Orders    HIV antibody (with reflex) (Completed)    Need for hepatitis C screening test        Relevant Orders    Hepatitis C antibody, reflex       No orders of the defined types were placed in this encounter.    Follow-up: No Follow-up  on file.   Boykin Nearing MD

## 2016-01-12 NOTE — Patient Instructions (Addendum)
Linda Cooper was seen today for diabetes and knee pain.  Diagnoses and all orders for this visit:  Diabetes mellitus without complication (HCC) -     HgB A1c -     Glucose (CBG)  Morbid obesity, unspecified obesity type (HCC) -     Cancel: Ambulatory referral to General Surgery -     Ambulatory referral to General Surgery -     ABO  Primary osteoarthritis of both knees -     Ambulatory referral to Orthopedic Surgery -     methylPREDNISolone acetate (DEPO-MEDROL) injection 40 mg; Inject 1 mL (40 mg total) into the articular space once.  Screening for HIV (human immunodeficiency virus) -     HIV antibody (with reflex)  Need for hepatitis C screening test -     Hepatitis C antibody, reflex   You have received a shot of steroid in your joint today. Rest and ice knee today. Regular activity tomorrow. Look out for redness, swelling, fever,severe pain in joint and call if you experience these symptoms.   F/u in 6 weeks for medically supervised weight loss   Dr.  Armen Pickup

## 2016-01-13 LAB — HIV ANTIBODY (ROUTINE TESTING W REFLEX): HIV: NONREACTIVE

## 2016-01-13 LAB — ABO

## 2016-01-13 LAB — HEPATITIS C ANTIBODY: HCV Ab: NEGATIVE

## 2016-01-14 NOTE — Assessment & Plan Note (Signed)
R knee steroid injection Ortho referral

## 2016-01-14 NOTE — Assessment & Plan Note (Addendum)
A: patient has been unable to lose weight with diet and exercise P: gen surg referral to discuss bariatric surgery

## 2016-01-14 NOTE — Assessment & Plan Note (Signed)
Well controlled Continue current care plan

## 2016-01-15 ENCOUNTER — Other Ambulatory Visit: Payer: Self-pay | Admitting: Family Medicine

## 2016-01-15 DIAGNOSIS — Z1231 Encounter for screening mammogram for malignant neoplasm of breast: Secondary | ICD-10-CM

## 2016-01-24 MED FILL — hydrALAZINE HCL 25 MG TABS: 25 | 30 days supply | Qty: 60 | Fill #3

## 2016-01-24 MED FILL — LOSARTAN-HCTZ 100-25 MG TAB: 100-25 | 30 days supply | Qty: 30 | Fill #5

## 2016-01-24 MED FILL — METFORMIN HCL ER 500 MG TAB: 500 | 30 days supply | Qty: 60 | Fill #3

## 2016-01-26 ENCOUNTER — Encounter: Payer: Self-pay | Admitting: Family Medicine

## 2016-01-26 ENCOUNTER — Ambulatory Visit (INDEPENDENT_AMBULATORY_CARE_PROVIDER_SITE_OTHER): Payer: BLUE CROSS/BLUE SHIELD | Admitting: Family Medicine

## 2016-01-26 ENCOUNTER — Ambulatory Visit
Admission: RE | Admit: 2016-01-26 | Discharge: 2016-01-26 | Disposition: A | Discharge status: Home / Self Care | Payer: BLUE CROSS/BLUE SHIELD | Source: Ambulatory Visit | Attending: Family Medicine | Admitting: Family Medicine

## 2016-01-26 VITALS — BP 185/94 | Ht 62.0 in | Wt 325.0 lb

## 2016-01-26 DIAGNOSIS — M170 Bilateral primary osteoarthritis of knee: Secondary | ICD-10-CM | POA: Diagnosis not present

## 2016-01-26 NOTE — Assessment & Plan Note (Addendum)
She has had fairly good response from corticosteroid injection therapy which is good. Shehass not gone to get the x-rays that her PCP ordered. I discussed the importance of getting that done.   She'll return so we can review the films in detail.   Her gait is quite antalgic so I am suspicious that she has bone-on-bone arthritis. She is not the optimal candidate for knee surgery given her fairly young age of 68 and her habitus also makes a total knee replacement less than ideal solution, however she is extremely limited by her knee pain. She'll get the films and come back next week so we can review them.  Notably her PCP has made referral for gastric bypass evaluation which might be very beneficial for her.

## 2016-01-26 NOTE — Patient Instructions (Signed)
Please get your knee Xrays and then we will look at the pictures when I see you back. Great to meet you!

## 2016-01-26 NOTE — Progress Notes (Signed)
Patient ID: Linda Cooper, female   DOB: 01/27/1961, 55 y.o.   MRN: [id number]  Linda Cooper - 55 y.o. female MRN [id number]  Date of birth: Feb 07, 1961    SUBJECTIVE:     CC bilateral knee pain Has had bilateral knee pain for several years now. In the last 2 year she's gotten some corticosteroid injections from her PCP which was extremely beneficial. She comes in today for further evaluation of continued knee pain. In general her left knee bothers her more but they both bother her.   She has pain with walking, climbing stairs. She also has night pain in the left knee. She does not have night pain in the right knee. The left knee occasionally locks and is stiff, the right is not.   The corticosteroid injections have helped the pain about 20%, she's also been wearing a brace on the left which gives her an increased  feeling of stability. Over-the-counter medication has not been beneficial. She also uses Biofreeze which seems to be helpful.  Pain is aching in nature. She is also noticed some fullness in the left posterior knee. ROS:     She's had no unusual weight change, has noticed no erythema of the knees. She's had no other significant large joint arthralgias although she does have some joint pains in her hands and fingers.  PERTINENT  PMH / PSH FH / / SH:  Past Medical, Surgical, Social, and Family History Reviewed & Updated in the EMR.  Pertinent findings include:  Morbid obesity BMI 60 Diabetes mellitus PCO S Hypertension Never smoker   OBJECTIVE: BP 185/94 mmHg  Ht  (1.575 m)  Wt 325 lb (147.419 kg)  BMI 59.43 kg/m2  Physical Exam:  Vital signs are reviewed. GEN.: Obese female no acute distress KNEES: Medial greater than lateral joint line tenderness of both knees. The left knee popliteal space is somewhat full. The right knee popliteal space is benign. Calf is soft bilaterally. She has full range of motion flexion extension. She is ligaments intact to varus and  valgus stress bi lateral knees.  SKIN: There is no increased erythema or warmth noted around the knees. There is no skin lesions or rash.  VASCULAR: Dorsalis pedis pulses are 1-2+ bilaterally equal.  ASSESSMENT & PLAN:  See problem based charting & AVS for pt instructions.

## 2016-01-31 ENCOUNTER — Telehealth: Payer: Self-pay | Admitting: *Deleted

## 2016-01-31 NOTE — Telephone Encounter (Signed)
Discussed xray results with patient along with her options of treatment. Pt thinks she may want me to refer her to ortho to have viscosupplementation injections since she has had 3 steroid injections already.  She will think about her options and give me a call back.

## 2016-01-31 NOTE — Telephone Encounter (Signed)
[place] She has pretty advanced arthritis in all 3 compartments of each knee. I thought she was going come back we will go over her x-rays but you can just tolerates pretty significant arthritis. Other than steroid injection, the only other option would be knee replacement and she's pretty young for that and I think they would probably want her to lose a fair amount of weight before they did that. Just in case she asks. The only other option would be for her to get some chronic pain medication from her PCP, not [place] Denny Levy

## 2016-02-22 ENCOUNTER — Other Ambulatory Visit: Payer: Self-pay | Admitting: Family Medicine

## 2016-02-22 MED FILL — TRUE METRIX TEST STRIP: 33 days supply | Qty: 100 | Fill #1

## 2016-02-22 MED FILL — hydrALAZINE HCL 25 MG TABS: 25 | 30 days supply | Qty: 60 | Fill #4

## 2016-02-23 ENCOUNTER — Encounter: Payer: Self-pay | Admitting: Family Medicine

## 2016-02-23 ENCOUNTER — Ambulatory Visit (INDEPENDENT_AMBULATORY_CARE_PROVIDER_SITE_OTHER): Payer: BLUE CROSS/BLUE SHIELD | Admitting: Family Medicine

## 2016-02-23 VITALS — BP 171/102 | HR 79 | Ht 62.0 in | Wt 330.0 lb

## 2016-02-23 DIAGNOSIS — M170 Bilateral primary osteoarthritis of knee: Secondary | ICD-10-CM

## 2016-02-23 MED FILL — METFORMIN HCL ER 500 MG TAB: 500 | 30 days supply | Qty: 60 | Fill #0

## 2016-02-23 MED FILL — LOSARTAN-HCTZ 100-25 MG TAB: 100-25 | 30 days supply | Qty: 30 | Fill #0

## 2016-02-23 NOTE — Assessment & Plan Note (Signed)
Severe bone on bone OA in both knees.  The L knee is so severe that she has a varus thrust gait with subluxation of her tibia evident on her film.  Her BMI is 59 and we discussed at length about having bariatric surgery for the eventual need of B/L Knee TKA's.  She is amenable to this and will talk with the bariatric center for starting the process.  In the interim, we will offer CSI every 2-3 months to alleviate some of her pain.  Did offer her medications including tramadol vs narcotics for severe pain to help until she can eventually have the surgery.  However, she would like to avoid this and states she is content with aleve.  She will f/u for injections with us or Dr. Armen PickupFunches.   Greater than 50% of the 25 minute face to face visit was spent discussing her options for DJD of the knee and bariatric surgery.

## 2016-02-23 NOTE — Progress Notes (Signed)
  Linda CleaverVictoria J Cooper - 55 y.o. female MRN [id number]  Date of birth: 1961-09-06  SUBJECTIVE:  Including CC & ROS.  Linda CleaverVictoria J Cooper is a 55 y.o. female who presents today for bilateral knee pain.    Knee Pain bilateral, follow-up - patient follows up today for ongoing bilateral knee pain. She did x-rays done after last visit on 2/17 which showed severe end-stage osteoarthritis of the medial compartment as well as the patellofemoral joint. The left is worse than the right. She has had previous corticosteroid injections of her knees and have worked for about 3-4 weeks. She takes Aleve otherwise for the pain. Pain is worse the end of the day and is worse with walking.  PMHx - Updated and reviewed.  Contributory factors include: Morbid obesity, hypertension, diabetes, Hx of Rheumatic fever with joint involvement  PSHx - Updated and reviewed.  Contributory factors include:  Hernia repair FHx - Updated and reviewed.  Contributory factors include:  Diabetes and hypertension Social Hx - Updated and reviewed. Contributory factors include: Nonsmoker  Medications - updated and reviewed   12 point ROS negative other than per HPI.   Exam:  Filed Vitals:   02/23/16 1030  BP: 171/102  Pulse: 79   Gen: NAD, AAO 3 Cardio- RRR Pulm - Normal respiratory effort/rate Skin: No rashes or erythema Extremities: No edema  Vascular: pulses +2 bilateral upper and lower extremity Psych: Normal affect   Knee:  Obvious varus deformity of the knee with varus thrust gait on the L.   TTP medial joint line B/L L>R, + 2 crepitus with knee extension ROM limited to 90 degrees flexion, 10 degrees extension  MS 5/5 LE   Neurovascularly intact B/L LE   Imaging:  2/17 B/L standing x-rays - End-stage osteoarthritis of both knees. However the left knee is advanced to the point where she has varus deformity including tibial subluxation laterally.

## 2016-02-27 ENCOUNTER — Other Ambulatory Visit: Payer: Self-pay | Admitting: Family Medicine

## 2016-02-27 MED FILL — TRUEplus LANCETS 28G MISC: 33 days supply | Qty: 100 | Fill #0

## 2016-03-11 ENCOUNTER — Ambulatory Visit: Payer: BLUE CROSS/BLUE SHIELD | Attending: Family Medicine | Admitting: Family Medicine

## 2016-03-11 ENCOUNTER — Encounter: Payer: Self-pay | Admitting: Family Medicine

## 2016-03-11 VITALS — BP 147/100 | HR 96 | Temp 97.9°F | Resp 16 | Ht 62.0 in | Wt 320.0 lb

## 2016-03-11 DIAGNOSIS — Z79899 Other long term (current) drug therapy: Secondary | ICD-10-CM | POA: Diagnosis not present

## 2016-03-11 DIAGNOSIS — I152 Hypertension secondary to endocrine disorders: Secondary | ICD-10-CM | POA: Diagnosis not present

## 2016-03-11 DIAGNOSIS — Z7984 Long term (current) use of oral hypoglycemic drugs: Secondary | ICD-10-CM | POA: Insufficient documentation

## 2016-03-11 DIAGNOSIS — M170 Bilateral primary osteoarthritis of knee: Secondary | ICD-10-CM | POA: Diagnosis not present

## 2016-03-11 DIAGNOSIS — G8929 Other chronic pain: Secondary | ICD-10-CM | POA: Insufficient documentation

## 2016-03-11 DIAGNOSIS — Z7982 Long term (current) use of aspirin: Secondary | ICD-10-CM | POA: Insufficient documentation

## 2016-03-11 DIAGNOSIS — I10 Essential (primary) hypertension: Secondary | ICD-10-CM | POA: Diagnosis not present

## 2016-03-11 DIAGNOSIS — E119 Type 2 diabetes mellitus without complications: Secondary | ICD-10-CM | POA: Insufficient documentation

## 2016-03-11 MED ORDER — HYDRALAZINE HCL 25 MG PO TABS: 25.0000 mg | ORAL_TABLET | Freq: Two times a day (BID) | ORAL | Status: DC

## 2016-03-11 MED ORDER — METHYLPREDNISOLONE ACETATE 40 MG/ML IJ SUSP
40.0000 mg | Freq: Once | INTRAMUSCULAR | Status: AC
  Administered 2016-03-11: 40 mg via INTRA_ARTICULAR

## 2016-03-11 MED ORDER — HYDRALAZINE HCL 25 MG PO TABS: 25.0000 mg | ORAL_TABLET | Freq: Three times a day (TID) | ORAL | Status: DC

## 2016-03-11 MED ORDER — LOSARTAN POTASSIUM-HCTZ 100-25 MG PO TABS: 1.0000 | ORAL_TABLET | Freq: Every day | ORAL | Status: DC

## 2016-03-11 NOTE — Assessment & Plan Note (Signed)
Morbidly obese with severe OA in both knees worse on L Steroid injections done today Continue weight loss Ortho referral

## 2016-03-11 NOTE — Progress Notes (Signed)
C/C pain on knees  Requesting steroid INJ  Pain scale #2 No tobacco user  No suicidal thoughts in the past two weeks

## 2016-03-11 NOTE — Patient Instructions (Addendum)
Diagnoses and all orders for this visit:  Hypertension due to endocrine disorder -     losartan-hydrochlorothiazide (HYZAAR) 100-25 MG tablet; Take 1 tablet by mouth daily. -     Discontinue: hydrALAZINE (APRESOLINE) 25 MG tablet; Take 1 tablet (25 mg total) by mouth 3 (three) times daily. -     hydrALAZINE (APRESOLINE) 25 MG tablet; Take 1 tablet (25 mg total) by mouth 2 (two) times daily.  Primary osteoarthritis of both knees -     methylPREDNISolone acetate (DEPO-MEDROL) injection 40 mg; Inject 1 mL (40 mg total) into the articular space once. -     methylPREDNISolone acetate (DEPO-MEDROL) injection 40 mg; Inject 1 mL (40 mg total) into the articular space once. -     AMB referral to orthopedics   You have received a shot of steroid in your joint today. Rest and ice knee today. Regular activity tomorrow. Look out for redness, swelling, fever,severe pain in joint and call if you experience these symptoms.  F/u in 4 weeks for weight check and HTN  Dr. Armen PickupFunches

## 2016-03-11 NOTE — Assessment & Plan Note (Signed)
She has lost 10 lbs Encouraged continued weight loss

## 2016-03-11 NOTE — Progress Notes (Signed)
Subjective:  Patient ID: Linda Cooper, female    DOB: 1961-09-20  Age: 55 y.o. MRN: [id number]  CC: No chief complaint on file.   HPI Linda Cooper presents for    1. Weight loss: she is morbidly obese with diabetes, HTN, chronic knee pain. She is working to lose weight by eating for her blood type.   2. Chronic knee pain: worsening pain. L knee is worse than R. Requesting knee injections. Amenable to ortho referral.   Social History  Substance Use Topics  . Smoking status: Never Smoker   . Smokeless tobacco: Never Used  . Alcohol Use: No    Outpatient Prescriptions Prior to Visit  Medication Sig Dispense Refill  . aspirin EC 81 MG tablet Take 1 tablet (81 mg total) by mouth daily.    . Blood Glucose Monitoring Suppl (TRUE METRIX METER) W/DEVICE KIT USE TO TEST 3 TIMES DAILY 1 kit 0  . glucose blood (TRUE METRIX BLOOD GLUCOSE TEST) test strip USE TO TEST 3 TIMES DAILY 100 each 12  . hydrALAZINE (APRESOLINE) 25 MG tablet Take 1 tablet (25 mg total) by mouth 2 (two) times daily. 60 tablet 5  . losartan-hydrochlorothiazide (HYZAAR) 100-25 MG tablet TAKE 1 TABLET BY MOUTH ONCE DAILY 30 tablet 3  . metFORMIN (GLUCOPHAGE-XR) 500 MG 24 hr tablet TAKE 2 TABLETS BY MOUTH DAILY AFTER SUPPER. 60 tablet 3  . Multiple Vitamins-Minerals (MULTIVITAMIN WITH MINERALS) tablet Take 1 tablet by mouth daily.    . naproxen sodium (ANAPROX) 220 MG tablet Take 440 mg by mouth 2 (two) times daily with a meal.    . Omega 3 1200 MG CAPS Take by mouth.    . TRUEPLUS LANCETS 28G MISC USE TO TEST 3 TIMES DAILY. 100 each 12  . vitamin B-12 (CYANOCOBALAMIN) 1000 MCG tablet Take 1,000 mcg by mouth daily.    . Vitamin D, Ergocalciferol, (DRISDOL) 50000 UNITS CAPS capsule Take 50,000 Units by mouth every 7 (seven) days.    . TRUEPLUS LANCETS 28G MISC USE TO TEST 3 TIMES DAILY. 100 each 12  . albuterol (PROVENTIL HFA;VENTOLIN HFA) 108 (90 BASE) MCG/ACT inhaler Inhale 2 puffs into the lungs every 4 (four)  hours as needed for wheezing. (Patient not taking: Reported on 03/11/2016) 1 Inhaler 0  . amLODipine (NORVASC) 10 MG tablet TAKE 1 TABLET BY MOUTH DAILY (Patient not taking: Reported on 03/11/2016) 90 tablet 1  . atorvastatin (LIPITOR) 40 MG tablet Take 1 tablet (40 mg total) by mouth daily. (Patient not taking: Reported on 03/11/2016) 90 tablet 3  . oxybutynin (OXYTROL) 3.9 MG/24HR Place 1 patch onto the skin every 3 (three) days. Reported on 03/11/2016     No facility-administered medications prior to visit.    ROS Review of Systems  Constitutional: Negative for fever and chills.  Eyes: Negative for visual disturbance.  Respiratory: Negative for shortness of breath.   Cardiovascular: Negative for chest pain.  Gastrointestinal: Negative for abdominal pain and blood in stool.  Musculoskeletal: Positive for joint swelling and arthralgias. Negative for back pain.  Skin: Negative for rash.  Allergic/Immunologic: Negative for immunocompromised state.  Hematological: Negative for adenopathy. Does not bruise/bleed easily.  Psychiatric/Behavioral: Negative for suicidal ideas and dysphoric mood.    Objective:  BP 147/100 mmHg  Pulse 96  Temp(Src) 97.9 F (36.6 C) (Oral)  Resp 16  Ht 5' 2"  (1.575 m)  Wt 320 lb (145.151 kg)  BMI 58.51 kg/m2  SpO2 98%  BP/Weight 03/11/2016 02/23/2016 4/78/2956  Systolic  BP 092 330 076  Diastolic BP 226 333 94  Wt. (Lbs) 320 330 325  BMI 58.51 60.34 59.43    Physical Exam  Constitutional: She is oriented to person, place, and time. She appears well-developed and well-nourished. No distress.  Morbidly obese   HENT:  Head: Normocephalic and atraumatic.  Cardiovascular: Normal rate, regular rhythm, normal heart sounds and intact distal pulses.   Pulmonary/Chest: Effort normal and breath sounds normal.  Musculoskeletal: She exhibits tenderness. She exhibits no edema.       Right knee: She exhibits decreased range of motion, swelling and effusion. She exhibits no  erythema.       Left knee: She exhibits decreased range of motion. She exhibits no swelling, no effusion and no erythema.  Neurological: She is alert and oriented to person, place, and time.  Skin: Skin is warm and dry. No rash noted.  Psychiatric: She has a normal mood and affect.   Lab Results  Component Value Date   HGBA1C 6.50 01/12/2016   After obtaining informed consent and cleaning the skin using iodine and alcohol a  steroid injection was performed at R knee using medial approach and L knee using medial approach with knee a flexed to 30 degrees  using 2% plain Lidocaine and 40 mg of Depo Medrol. This was technically difficult on the L but well tolerated.   Assessment & Plan:   There are no diagnoses linked to this encounter. Diagnoses and all orders for this visit:  Hypertension due to endocrine disorder -     losartan-hydrochlorothiazide (HYZAAR) 100-25 MG tablet; Take 1 tablet by mouth daily. -     Discontinue: hydrALAZINE (APRESOLINE) 25 MG tablet; Take 1 tablet (25 mg total) by mouth 3 (three) times daily. -     hydrALAZINE (APRESOLINE) 25 MG tablet; Take 1 tablet (25 mg total) by mouth 2 (two) times daily.  Primary osteoarthritis of both knees -     methylPREDNISolone acetate (DEPO-MEDROL) injection 40 mg; Inject 1 mL (40 mg total) into the articular space once. -     methylPREDNISolone acetate (DEPO-MEDROL) injection 40 mg; Inject 1 mL (40 mg total) into the articular space once. -     AMB referral to orthopedics  Morbid obesity, unspecified obesity type ([place])   Meds ordered this encounter  Medications  . losartan-hydrochlorothiazide (HYZAAR) 100-25 MG tablet    Sig: Take 1 tablet by mouth daily.    Dispense:  30 tablet    Refill:  5  . DISCONTD: hydrALAZINE (APRESOLINE) 25 MG tablet    Sig: Take 1 tablet (25 mg total) by mouth 3 (three) times daily.    Dispense:  90 tablet    Refill:  5  . methylPREDNISolone acetate (DEPO-MEDROL) injection 40 mg    Sig:   .  methylPREDNISolone acetate (DEPO-MEDROL) injection 40 mg    Sig:   . hydrALAZINE (APRESOLINE) 25 MG tablet    Sig: Take 1 tablet (25 mg total) by mouth 2 (two) times daily.    Dispense:  60 tablet    Refill:  5    Dc TID dosing    Follow-up: No Follow-up on file.   Boykin Nearing MD

## 2016-03-28 MED FILL — LOSARTAN-HCTZ 100-25 MG TAB: 100-25 | 30 days supply | Qty: 30 | Fill #1

## 2016-04-08 ENCOUNTER — Encounter: Payer: Self-pay | Admitting: Family Medicine

## 2016-04-08 ENCOUNTER — Ambulatory Visit: Payer: BLUE CROSS/BLUE SHIELD | Attending: Family Medicine | Admitting: Family Medicine

## 2016-04-08 VITALS — BP 129/81 | HR 91 | Temp 98.3°F | Resp 16 | Ht 62.0 in | Wt 314.0 lb

## 2016-04-08 DIAGNOSIS — Z7984 Long term (current) use of oral hypoglycemic drugs: Secondary | ICD-10-CM | POA: Diagnosis not present

## 2016-04-08 DIAGNOSIS — Z7982 Long term (current) use of aspirin: Secondary | ICD-10-CM | POA: Insufficient documentation

## 2016-04-08 DIAGNOSIS — E119 Type 2 diabetes mellitus without complications: Secondary | ICD-10-CM | POA: Diagnosis not present

## 2016-04-08 DIAGNOSIS — Z79899 Other long term (current) drug therapy: Secondary | ICD-10-CM | POA: Diagnosis not present

## 2016-04-08 LAB — POCT GLYCOSYLATED HEMOGLOBIN (HGB A1C): HEMOGLOBIN A1C: 6.3

## 2016-04-08 LAB — GLUCOSE, POCT (MANUAL RESULT ENTRY): POC GLUCOSE: 128 mg/dL — AB (ref 70–99)

## 2016-04-08 MED ORDER — METFORMIN HCL ER 500 MG PO TB24: 1000.0000 mg | ORAL_TABLET | Freq: Every day | ORAL | Status: DC

## 2016-04-08 NOTE — Progress Notes (Signed)
Subjective:  Patient ID: Linda Cooper, female    DOB: 04-10-1961  Age: 55 y.o. MRN: [id number]  CC: Diabetes   HPI TEENA MANGUS presents for    1. CHRONIC DIABETES  Disease Monitoring  Blood Sugar Ranges: not checking   Polyuria: no   Visual problems: no   Medication Compliance: yes  Medication Side Effects  Hypoglycemia: no   Preventitive Health Care  Eye Exam: due   Foot Exam: done today   Diet pattern: low carb usually   Exercise: minimal   Social History  Substance Use Topics  . Smoking status: Never Smoker   . Smokeless tobacco: Never Used  . Alcohol Use: No    Outpatient Prescriptions Prior to Visit  Medication Sig Dispense Refill  . albuterol (PROVENTIL HFA;VENTOLIN HFA) 108 (90 BASE) MCG/ACT inhaler Inhale 2 puffs into the lungs every 4 (four) hours as needed for wheezing. 1 Inhaler 0  . aspirin EC 81 MG tablet Take 1 tablet (81 mg total) by mouth daily.    . Blood Glucose Monitoring Suppl (TRUE METRIX METER) W/DEVICE KIT USE TO TEST 3 TIMES DAILY 1 kit 0  . glucose blood (TRUE METRIX BLOOD GLUCOSE TEST) test strip USE TO TEST 3 TIMES DAILY 100 each 12  . hydrALAZINE (APRESOLINE) 25 MG tablet Take 1 tablet (25 mg total) by mouth 2 (two) times daily. 60 tablet 5  . losartan-hydrochlorothiazide (HYZAAR) 100-25 MG tablet Take 1 tablet by mouth daily. 30 tablet 5  . metFORMIN (GLUCOPHAGE-XR) 500 MG 24 hr tablet TAKE 2 TABLETS BY MOUTH DAILY AFTER SUPPER. 60 tablet 3  . Multiple Vitamins-Minerals (MULTIVITAMIN WITH MINERALS) tablet Take 1 tablet by mouth daily.    . naproxen sodium (ANAPROX) 220 MG tablet Take 440 mg by mouth 2 (two) times daily with a meal.    . Omega 3 1200 MG CAPS Take by mouth.    . TRUEPLUS LANCETS 28G MISC USE TO TEST 3 TIMES DAILY. 100 each 12  . vitamin B-12 (CYANOCOBALAMIN) 1000 MCG tablet Take 1,000 mcg by mouth daily.    . Vitamin D, Ergocalciferol, (DRISDOL) 50000 UNITS CAPS capsule Take 50,000 Units by mouth every 7 (seven)  days.     No facility-administered medications prior to visit.    ROS Review of Systems  Constitutional: Negative for fever and chills.  Eyes: Negative for visual disturbance.  Respiratory: Negative for shortness of breath.   Cardiovascular: Negative for chest pain.  Gastrointestinal: Negative for abdominal pain and blood in stool.  Musculoskeletal: Negative for back pain.  Skin: Negative for rash.  Allergic/Immunologic: Negative for immunocompromised state.  Hematological: Negative for adenopathy. Does not bruise/bleed easily.  Psychiatric/Behavioral: Negative for suicidal ideas and dysphoric mood.    Objective:  BP 129/81 mmHg  Pulse 91  Temp(Src) 98.3 F (36.8 C) (Oral)  Resp 16  Ht _0  (1.575 m)  Wt 314 lb (142.429 kg)  BMI 57.42 kg/m2  SpO2 100%  BP/Weight 04/08/2016 03/11/2016 0/76/2263  Systolic BP 335 456 256  Diastolic BP 81 389 373  Wt. (Lbs) 314 320 330  BMI 57.42 58.51 60.34    Physical Exam  Constitutional: She is oriented to person, place, and time. She appears well-developed and well-nourished. No distress.  Morbidly obese   HENT:  Head: Normocephalic and atraumatic.  Cardiovascular: Normal rate, regular rhythm, normal heart sounds and intact distal pulses.   Pulmonary/Chest: Effort normal and breath sounds normal.  Musculoskeletal: She exhibits no edema.  Neurological: She is alert  and oriented to person, place, and time.  Skin: Skin is warm and dry. No rash noted.  Psychiatric: She has a normal mood and affect.   Lab Results  Component Value Date   HGBA1C 6.3 04/08/2016   CBG 128   Assessment & Plan:   There are no diagnoses linked to this encounter. [name] was seen today for diabetes.  Diagnoses and all orders for this visit:  Diabetes mellitus without complication ([place]) -     POCT glycosylated hemoglobin (Hb A1C) -     POCT glucose (manual entry) -     Ambulatory referral to Ophthalmology   No orders of the defined types were  placed in this encounter.    Follow-up: No Follow-up on file.   Boykin Nearing MD

## 2016-04-08 NOTE — Progress Notes (Signed)
Patient ID: Linda Cooper, female   DOB: 09-Dec-1961, 55 y.o.   MRN: [id number]

## 2016-04-08 NOTE — Progress Notes (Signed)
F/U knee INJ  No pain today  No tobacco user  No suicidal thought in the past two weeks

## 2016-04-08 NOTE — Assessment & Plan Note (Signed)
Well controlled diabetes Continue current regimen  

## 2016-04-08 NOTE — Patient Instructions (Addendum)
Linda Cooper was seen today for diabetes.  Diagnoses and all orders for this visit:  Diabetes mellitus without complication (HCC) -     POCT glycosylated hemoglobin (Hb A1C) -     POCT glucose (manual entry) -     Ambulatory referral to Ophthalmology   Low out cost optometrist (about $65.00 for office visit)  1. Dr. Wynona LunaSteven Bernstorf Phone # [phone] [address]  [place] [address]   2. [facility]  Phone # [phone] [address] [place], [place] [address]    F/u in 3 months for diabetes   Dr. Armen PickupFunches

## 2016-05-02 MED FILL — METFORMIN HCL ER 500 MG TAB: 500 | 30 days supply | Qty: 60 | Fill #1

## 2016-05-02 MED FILL — LOSARTAN-HCTZ 100-25 MG TAB: 100-25 | 30 days supply | Qty: 30 | Fill #2

## 2016-05-02 MED FILL — hydrALAZINE HCL 25 MG TABS: 25 | 30 days supply | Qty: 60 | Fill #5

## 2016-06-04 MED FILL — LOSARTAN-HCTZ 100-25 MG TAB: 100-25 | 30 days supply | Qty: 30 | Fill #3

## 2016-07-05 MED FILL — LOSARTAN-HCTZ 100-25 MG TAB: 100-25 | 30 days supply | Qty: 30 | Fill #4

## 2016-07-05 MED FILL — hydrALAZINE HCL 25 MG TABS: 25 | 30 days supply | Qty: 90 | Fill #0

## 2016-07-05 MED FILL — METFORMIN HCL ER 500 MG TAB: 500 | 30 days supply | Qty: 60 | Fill #2

## 2016-08-13 MED FILL — LOSARTAN-HCTZ 100-25 MG TAB: 100-25 | 30 days supply | Qty: 30 | Fill #0

## 2016-08-29 MED FILL — hydrALAZINE HCL 25 MG TABS: 25 | 30 days supply | Qty: 90 | Fill #1

## 2016-08-29 MED FILL — METFORMIN HCL ER 500 MG TAB: 500 | 30 days supply | Qty: 60 | Fill #3

## 2016-09-13 MED FILL — LOSARTAN-HCTZ 100-25 MG TAB: 100-25 | 30 days supply | Qty: 30 | Fill #1

## 2016-10-10 ENCOUNTER — Ambulatory Visit
Admission: RE | Admit: 2016-10-10 | Discharge: 2016-10-10 | Disposition: A | Discharge status: Home / Self Care | Payer: BLUE CROSS/BLUE SHIELD | Source: Ambulatory Visit | Attending: Family Medicine | Admitting: Family Medicine

## 2016-10-10 DIAGNOSIS — Z1231 Encounter for screening mammogram for malignant neoplasm of breast: Secondary | ICD-10-CM

## 2016-10-17 MED FILL — METFORMIN HCL ER 500 MG TAB: 500 | 30 days supply | Qty: 60 | Fill #4

## 2016-10-17 MED FILL — LOSARTAN-HCTZ 100-25 MG TAB: 100-25 | 30 days supply | Qty: 30 | Fill #2

## 2016-11-27 MED FILL — hydrALAZINE HCL 25 MG TABS: 25 | 30 days supply | Qty: 90 | Fill #2

## 2016-11-27 MED FILL — LOSARTAN-HCTZ 100-25 MG TAB: 100-25 | 30 days supply | Qty: 30 | Fill #3

## 2016-11-27 MED FILL — METFORMIN HCL ER 500 MG TAB: 500 | 30 days supply | Qty: 60 | Fill #0

## 2016-12-04 ENCOUNTER — Other Ambulatory Visit: Payer: Self-pay | Admitting: Family Medicine

## 2017-01-06 MED FILL — LOSARTAN-HCTZ 100-25 MG TAB: 100-25 | 30 days supply | Qty: 30 | Fill #4

## 2017-01-27 ENCOUNTER — Ambulatory Visit: Payer: BLUE CROSS/BLUE SHIELD | Attending: Family Medicine | Admitting: *Deleted

## 2017-01-27 DIAGNOSIS — Z111 Encounter for screening for respiratory tuberculosis: Secondary | ICD-10-CM

## 2017-01-27 NOTE — Progress Notes (Signed)
    Darleene CleaverVictoria J Cardinal, 56 y.o. female is here today for placement of PPD test Reason for PPD test: Employment   Pt taken PPD test before: yes Verified in allergy area and with patient that they are not allergic to the products PPD is made of (Phenol or Tween). Yes Is patient taking any oral or IV steroid medication now or have they taken it in the last month? no Has the patient ever received the BCG vaccine?: no Has the patient been in recent contact with anyone known or suspected of having active TB disease?: no   PPD placed on 01/27/2017 to left ventral forearm.  Patient advised to return for reading within 48-72 hours. Guy Francoravia Marty Sadlowski,  RN, BSN

## 2017-01-30 ENCOUNTER — Encounter: Payer: Self-pay | Admitting: *Deleted

## 2017-01-30 LAB — TB SKIN TEST
Induration: 0 mm
TB SKIN TEST: NEGATIVE

## 2017-01-30 NOTE — Addendum Note (Signed)
Addended by: Guy FrancoBENJAMIN, Kage Willmann on: 01/30/2017 12:11 PM   Modules accepted: Level of Service

## 2017-02-11 MED FILL — LOSARTAN-HCTZ 100-25 MG TAB: 100-25 | 30 days supply | Qty: 30 | Fill #5

## 2017-03-04 MED FILL — METFORMIN HCL ER 500 MG TAB: 500 | 30 days supply | Qty: 60 | Fill #1

## 2017-03-04 MED FILL — hydrALAZINE HCL 25 MG TABS: 25 | 30 days supply | Qty: 90 | Fill #3

## 2017-03-06 ENCOUNTER — Other Ambulatory Visit: Payer: Self-pay

## 2017-03-06 MED ORDER — ONETOUCH ULTRA SYSTEM W/DEVICE KIT: 1.0000 | PACK | Freq: Once | 0 refills | Status: AC

## 2017-03-06 MED ORDER — ONETOUCH DELICA LANCETS 33G MISC: 11 refills | Status: DC

## 2017-03-06 MED ORDER — GLUCOSE BLOOD VI STRP: ORAL_STRIP | 12 refills | Status: DC

## 2017-03-06 MED FILL — TRUEplus LANCETS 28G MISC: 30 days supply | Qty: 100 | Fill #0

## 2017-03-06 MED FILL — TRUE METRIX TEST STRIP: 15 days supply | Qty: 50 | Fill #0

## 2017-03-19 ENCOUNTER — Other Ambulatory Visit: Payer: Self-pay | Admitting: Family Medicine

## 2017-03-19 DIAGNOSIS — I152 Hypertension secondary to endocrine disorders: Secondary | ICD-10-CM

## 2017-03-19 MED FILL — LOSARTAN-HCTZ 100-25 MG TAB: 100-25 | 30 days supply | Qty: 30 | Fill #0

## 2017-04-15 ENCOUNTER — Encounter: Payer: Self-pay | Admitting: Family Medicine

## 2017-04-15 ENCOUNTER — Ambulatory Visit: Payer: BLUE CROSS/BLUE SHIELD | Attending: Family Medicine | Admitting: Family Medicine

## 2017-04-15 VITALS — BP 160/84 | HR 92 | Temp 97.9°F | Ht 62.0 in | Wt 293.4 lb

## 2017-04-15 DIAGNOSIS — I10 Essential (primary) hypertension: Secondary | ICD-10-CM | POA: Insufficient documentation

## 2017-04-15 DIAGNOSIS — Z7982 Long term (current) use of aspirin: Secondary | ICD-10-CM | POA: Diagnosis not present

## 2017-04-15 DIAGNOSIS — Z6841 Body Mass Index (BMI) 40.0 and over, adult: Secondary | ICD-10-CM | POA: Diagnosis not present

## 2017-04-15 DIAGNOSIS — I152 Hypertension secondary to endocrine disorders: Secondary | ICD-10-CM

## 2017-04-15 DIAGNOSIS — Z7984 Long term (current) use of oral hypoglycemic drugs: Secondary | ICD-10-CM | POA: Insufficient documentation

## 2017-04-15 DIAGNOSIS — Z79899 Other long term (current) drug therapy: Secondary | ICD-10-CM | POA: Diagnosis not present

## 2017-04-15 DIAGNOSIS — E119 Type 2 diabetes mellitus without complications: Secondary | ICD-10-CM | POA: Diagnosis not present

## 2017-04-15 LAB — POCT GLYCOSYLATED HEMOGLOBIN (HGB A1C): Hemoglobin A1C: 6.1

## 2017-04-15 LAB — GLUCOSE, POCT (MANUAL RESULT ENTRY): POC GLUCOSE: 104 mg/dL — AB (ref 70–99)

## 2017-04-15 NOTE — Assessment & Plan Note (Signed)
A; elevated Med: compliant P: Check CMP and lipids Continue hyzaar 100-25 mg daily Continue hydralazine 25 mg BID c

## 2017-04-15 NOTE — Patient Instructions (Addendum)
[name] was seen today for diabetes.  Diagnoses and all orders for this visit:  Diabetes mellitus without complication (HCC) -     POCT glucose (manual entry) -     POCT glycosylated hemoglobin (Hb A1C) -     CMP14+EGFR -     Lipid Panel  Hypertension due to endocrine disorder  great job with diet and weight loss!  f/u in 3 months for diabetes and HTN  Dr. Adrian Blackwater

## 2017-04-15 NOTE — Progress Notes (Addendum)
Subjective:  Patient ID: Linda Cooper, female    DOB: 08/25/1961  Age: 56 y.o. MRN: [id number]  CC: Diabetes   HPI Linda Cooper presents for    1. CHRONIC DIABETES  Disease Monitoring  Blood Sugar Ranges: average over past 7-30 days is 102-104  Polyuria: no   Visual problems: no   Medication Compliance: yes  Medication Side Effects  Hypoglycemia: no   Preventitive Health Care  Eye Exam: due   Foot Exam: done today   Diet pattern: low carb, monitoring with myfitnesspal. She is motivated to lose weight and improve health by the expected death of her father and the sudden deaths of her cousin (found dead in car one month after death of his wife)  and his wife (died after childbirth)  Exercise: minimal    2. CHRONIC HYPERTENSION  Disease Monitoring  Blood pressure range: not checking   Chest pain: no   Dyspnea: no   Claudication: no   Medication compliance: yes, but she skips second hydralazine dose some days  Medication Side Effects  Lightheadedness: no   Urinary frequency: no   Edema: yes    Social History  Substance Use Topics  . Smoking status: Never Smoker  . Smokeless tobacco: Never Used  . Alcohol use No    Outpatient Medications Prior to Visit  Medication Sig Dispense Refill  . glucose blood test strip Use as instructed 100 each 12  . hydrALAZINE (APRESOLINE) 25 MG tablet Take 1 tablet (25 mg total) by mouth 2 (two) times daily. 60 tablet 5  . losartan-hydrochlorothiazide (HYZAAR) 100-25 MG tablet TAKE 1 TABLET BY MOUTH DAILY. 30 tablet 0  . metFORMIN (GLUCOPHAGE-XR) 500 MG 24 hr tablet Take 2 tablets (1,000 mg total) by mouth daily after supper. 60 tablet 5  . Multiple Vitamins-Minerals (MULTIVITAMIN WITH MINERALS) tablet Take 1 tablet by mouth daily.    Marland Kitchen albuterol (PROVENTIL HFA;VENTOLIN HFA) 108 (90 BASE) MCG/ACT inhaler Inhale 2 puffs into the lungs every 4 (four) hours as needed for wheezing. (Patient not taking: Reported on 04/15/2017) 1  Inhaler 0  . aspirin EC 81 MG tablet Take 1 tablet (81 mg total) by mouth daily. (Patient not taking: Reported on 04/15/2017)    . naproxen sodium (ANAPROX) 220 MG tablet Take 440 mg by mouth 2 (two) times daily with a meal.    . Omega 3 1200 MG CAPS Take by mouth.    Linda Cooper DELICA LANCETS 59D MISC USE TO TEST BLOOD SUGAR 3 TIMES DAILY. (Patient not taking: Reported on 04/15/2017) 1 each 11  . vitamin B-12 (CYANOCOBALAMIN) 1000 MCG tablet Take 1,000 mcg by mouth daily.    . Vitamin D, Ergocalciferol, (DRISDOL) 50000 UNITS CAPS capsule Take 50,000 Units by mouth every 7 (seven) days.     No facility-administered medications prior to visit.     ROS Review of Systems  Constitutional: Negative for chills and fever.  Eyes: Negative for visual disturbance.  Respiratory: Negative for shortness of breath.   Cardiovascular: Positive for leg swelling. Negative for chest pain.  Gastrointestinal: Negative for abdominal pain and blood in stool.  Musculoskeletal: Positive for arthralgias (improved with hylauronic acid and blueberry twice daily supplement). Negative for back pain.  Skin: Negative for rash.  Allergic/Immunologic: Negative for immunocompromised state.  Hematological: Negative for adenopathy. Does not bruise/bleed easily.  Psychiatric/Behavioral: Negative for dysphoric mood and suicidal ideas.    Objective:  BP (!) 160/84   Pulse 92   Temp 97.9 F (36.6  C) (Oral)   Ht _0  (1.575 m)   Wt 293 lb 6.4 oz (133.1 kg)   SpO2 99%   BMI 53.66 kg/m   BP/Weight 04/15/2017 07/11/2918 12/14/6058  Systolic BP 045 997 741  Diastolic BP 99 81 423  Wt. (Lbs) 293.4 314 320  BMI 53.66 57.42 58.51   Wt Readings from Last 3 Encounters:  04/15/17 293 lb 6.4 oz (133.1 kg)  04/08/16 (!) 314 lb (142.4 kg)  03/11/16 (!) 320 lb (145.2 kg)    Physical Exam  Constitutional: She is oriented to person, place, and time. She appears well-developed and well-nourished. No distress.  Morbidly obese   HENT:   Head: Normocephalic and atraumatic.  Cardiovascular: Normal rate, regular rhythm, normal heart sounds and intact distal pulses.   Pulmonary/Chest: Effort normal and breath sounds normal.  Musculoskeletal: She exhibits edema (trace worse on L side ).  Neurological: She is alert and oriented to person, place, and time.  Skin: Skin is warm and dry. No rash noted.  Psychiatric: She has a normal mood and affect.   Lab Results  Component Value Date   HGBA1C 6.1 04/15/2017   CBG 104  Assessment & Plan:   There are no diagnoses linked to this encounter. Linda Cooper was seen today for diabetes.  Diagnoses and all orders for this visit:  Diabetes mellitus without complication ([place]) -     POCT glucose (manual entry) -     POCT glycosylated hemoglobin (Hb A1C) -     CMP14+EGFR -     Lipid Panel  Hypertension due to endocrine disorder  Morbid obesity ([place])   No orders of the defined types were placed in this encounter.   Follow-up: Return in about 3 months (around 07/16/2017) for HTN and diabetes .   Linda Nearing MD

## 2017-04-15 NOTE — Assessment & Plan Note (Signed)
Well controlled Improved with weight loss Continue current regimen

## 2017-04-15 NOTE — Assessment & Plan Note (Signed)
Congratulated weight loss She will now incorporate exercise since knees feel better

## 2017-04-16 ENCOUNTER — Ambulatory Visit: Payer: BLUE CROSS/BLUE SHIELD | Attending: Family Medicine

## 2017-04-16 DIAGNOSIS — E119 Type 2 diabetes mellitus without complications: Secondary | ICD-10-CM | POA: Diagnosis not present

## 2017-04-16 NOTE — Progress Notes (Signed)
Patient here for lab visit only 

## 2017-04-17 LAB — CMP14+EGFR
ALT: 15 IU/L (ref 0–32)
AST: 14 IU/L (ref 0–40)
Albumin/Globulin Ratio: 1.3 (ref 1.2–2.2)
Albumin: 3.8 g/dL (ref 3.5–5.5)
Alkaline Phosphatase: 83 IU/L (ref 39–117)
BUN/Creatinine Ratio: 31 — ABNORMAL HIGH (ref 9–23)
BUN: 27 mg/dL — ABNORMAL HIGH (ref 6–24)
Bilirubin Total: 0.6 mg/dL (ref 0.0–1.2)
CALCIUM: 9.4 mg/dL (ref 8.7–10.2)
CO2: 24 mmol/L (ref 18–29)
CREATININE: 0.86 mg/dL (ref 0.57–1.00)
Chloride: 98 mmol/L (ref 96–106)
GFR, EST AFRICAN AMERICAN: 87 mL/min/{1.73_m2} (ref >59)
GFR, EST NON AFRICAN AMERICAN: 75 mL/min/{1.73_m2} (ref >59)
GLOBULIN, TOTAL: 3 g/dL (ref 1.5–4.5)
Glucose: 102 mg/dL — ABNORMAL HIGH (ref 65–99)
Potassium: 3.9 mmol/L (ref 3.5–5.2)
SODIUM: 141 mmol/L (ref 134–144)
TOTAL PROTEIN: 6.8 g/dL (ref 6.0–8.5)

## 2017-04-17 LAB — LIPID PANEL
CHOL/HDL RATIO: 2 ratio (ref 0.0–4.4)
Cholesterol, Total: 129 mg/dL (ref 100–199)
HDL: 65 mg/dL (ref >39)
LDL CALC: 45 mg/dL (ref 0–99)
TRIGLYCERIDES: 97 mg/dL (ref 0–149)
VLDL Cholesterol Cal: 19 mg/dL (ref 5–40)

## 2017-04-21 ENCOUNTER — Other Ambulatory Visit: Payer: Self-pay | Admitting: Family Medicine

## 2017-04-21 DIAGNOSIS — I152 Hypertension secondary to endocrine disorders: Secondary | ICD-10-CM

## 2017-04-21 MED FILL — LOSARTAN-HCTZ 100-25 MG TAB: 100-25 | 30 days supply | Qty: 30 | Fill #0

## 2017-04-21 MED FILL — METFORMIN HCL ER 500 MG TAB: 500 | 30 days supply | Qty: 60 | Fill #0

## 2017-04-23 ENCOUNTER — Encounter: Payer: Self-pay | Admitting: Family Medicine

## 2017-04-29 ENCOUNTER — Telehealth: Payer: Self-pay

## 2017-04-29 NOTE — Telephone Encounter (Signed)
Pt was called and informed of lab results. 

## 2017-05-01 ENCOUNTER — Encounter: Payer: Self-pay | Admitting: Physician Assistant

## 2017-05-01 ENCOUNTER — Ambulatory Visit: Payer: BLUE CROSS/BLUE SHIELD | Attending: Family Medicine | Admitting: Physician Assistant

## 2017-05-01 VITALS — BP 154/88 | HR 102 | Temp 98.4°F | Resp 16 | Wt 289.6 lb

## 2017-05-01 DIAGNOSIS — Z79899 Other long term (current) drug therapy: Secondary | ICD-10-CM | POA: Insufficient documentation

## 2017-05-01 DIAGNOSIS — J40 Bronchitis, not specified as acute or chronic: Secondary | ICD-10-CM | POA: Diagnosis not present

## 2017-05-01 DIAGNOSIS — Z881 Allergy status to other antibiotic agents status: Secondary | ICD-10-CM | POA: Diagnosis not present

## 2017-05-01 DIAGNOSIS — Z7982 Long term (current) use of aspirin: Secondary | ICD-10-CM | POA: Insufficient documentation

## 2017-05-01 DIAGNOSIS — Z7984 Long term (current) use of oral hypoglycemic drugs: Secondary | ICD-10-CM | POA: Insufficient documentation

## 2017-05-01 DIAGNOSIS — Z888 Allergy status to other drugs, medicaments and biological substances status: Secondary | ICD-10-CM | POA: Diagnosis not present

## 2017-05-01 DIAGNOSIS — I152 Hypertension secondary to endocrine disorders: Secondary | ICD-10-CM | POA: Diagnosis not present

## 2017-05-01 DIAGNOSIS — E119 Type 2 diabetes mellitus without complications: Secondary | ICD-10-CM

## 2017-05-01 DIAGNOSIS — R05 Cough: Secondary | ICD-10-CM | POA: Diagnosis not present

## 2017-05-01 DIAGNOSIS — I10 Essential (primary) hypertension: Secondary | ICD-10-CM | POA: Diagnosis not present

## 2017-05-01 MED ORDER — BENZONATATE 100 MG PO CAPS: 200.0000 mg | ORAL_CAPSULE | Freq: Two times a day (BID) | ORAL | 0 refills | Status: DC | PRN

## 2017-05-01 MED ORDER — AZITHROMYCIN 250 MG PO TABS: ORAL_TABLET | ORAL | 0 refills | Status: DC

## 2017-05-01 MED ORDER — ALBUTEROL SULFATE HFA 108 (90 BASE) MCG/ACT IN AERS: 2.0000 | INHALATION_SPRAY | RESPIRATORY_TRACT | 0 refills | Status: DC | PRN

## 2017-05-01 MED FILL — AZITHROMYCIN 250 MG TABLET: 250 | 5 days supply | Qty: 6 | Fill #0

## 2017-05-01 MED FILL — BENZONATATE 100 MG CAPSULE: 100 | 10 days supply | Qty: 40 | Fill #0

## 2017-05-01 MED FILL — VENTOLIN HFA 90 MCG INHALER: 108 (90 BAS | 16 days supply | Qty: 18 | Fill #0

## 2017-05-01 NOTE — Progress Notes (Signed)
Linda Cooper, is a 56 y.o. female  XBJ:[id number]  HYQ:[id number]  DOB - September 04, 1961  Subjective:  Chief Complaint and HPI: Linda Cooper is a 56 y.o. female here today for 9 day h/o cough/congestion.  First 2 days she had fever and body aches.  That has improved.  Cough still harsh and productive of greenish sputum.  She is having some wheezing and mild ST.  No N/V/D.    Compliant with meds.  Blood sugars running <120.    ROS:   Constitutional:  No f/c, No night sweats, No unexplained weight loss. EENT:  No vision changes, No blurry vision, No hearing changes. No mouth, throat, or ear problems other than congestion and mild ST.   Respiratory: + cough, No SOB, +some wheezing Cardiac: No CP, no palpitations GI:  No abd pain, No N/V/D. GU: No Urinary s/sx Musculoskeletal: No joint pain Neuro: No headache, no dizziness, no motor weakness.  Skin: No rash Endocrine:  No polydipsia. No polyuria.  Psych: Denies SI/HI  No problems updated.  ALLERGIES: Allergies  Allergen Reactions  . Ace Inhibitors Cough  . Erythromycin Base     Cramping in abdomen is reaction/no rash    PAST MEDICAL HISTORY: Past Medical History:  Diagnosis Date  . Diabetes mellitus without complication (HCC) Dx 2007  . Hypertension Dx 1990  . Rheumatic fever childhood    has intermittent murmur and hand arthritis     MEDICATIONS AT HOME: Prior to Admission medications   Medication Sig Start Date End Date Taking? Authorizing Provider  albuterol (PROVENTIL HFA;VENTOLIN HFA) 108 (90 Base) MCG/ACT inhaler Inhale 2 puffs into the lungs every 4 (four) hours as needed for wheezing. 05/01/17   Anders Simmonds, PA-C  aspirin EC 81 MG tablet Take 1 tablet (81 mg total) by mouth daily. Patient not taking: Reported on 04/15/2017 12/15/14   Dessa Phi, MD  azithromycin (ZITHROMAX) 250 MG tablet Take 2 today then 1 daily 05/01/17   Anders Simmonds, PA-C  benzonatate (TESSALON) 100 MG capsule Take 2 capsules  (200 mg total) by mouth 2 (two) times daily as needed for cough. 05/01/17   Anders Simmonds, PA-C  glucose blood test strip Use as instructed 03/06/17   Dessa Phi, MD  hydrALAZINE (APRESOLINE) 25 MG tablet Take 1 tablet (25 mg total) by mouth 2 (two) times daily. 03/11/16   Funches, Gerilyn Nestle, MD  losartan-hydrochlorothiazide (HYZAAR) 100-25 MG tablet TAKE 1 TABLET BY MOUTH DAILY. 04/21/17   Funches, Gerilyn Nestle, MD  metFORMIN (GLUCOPHAGE-XR) 500 MG 24 hr tablet TAKE 2 TABLETS BY MOUTH DAILY AFTER SUPPER. 04/21/17   Funches, Gerilyn Nestle, MD  Multiple Vitamins-Minerals (MULTIVITAMIN WITH MINERALS) tablet Take 1 tablet by mouth daily.    [provider]  naproxen sodium (ANAPROX) 220 MG tablet Take 440 mg by mouth 2 (two) times daily with a meal.    [provider]  Omega 3 1200 MG CAPS Take by mouth.    [provider]  [facility] DELICA LANCETS 33G MISC USE TO TEST BLOOD SUGAR 3 TIMES DAILY. Patient not taking: Reported on 04/15/2017 03/06/17   Dessa Phi, MD  vitamin B-12 (CYANOCOBALAMIN) 1000 MCG tablet Take 1,000 mcg by mouth daily.    [provider]  Vitamin D, Ergocalciferol, (DRISDOL) 50000 UNITS CAPS capsule Take 50,000 Units by mouth every 7 (seven) days.    [provider]     Objective:  EXAM:   Vitals:   05/01/17 1139  BP: (!) 154/88  Pulse: (!) 102  Resp: 16  Temp: 98.4 F (36.9 C)  TempSrc: Oral  SpO2: 96%  Weight: 289 lb 9.6 oz (131.4 kg)    General appearance : A&OX3. NAD. Non-toxic-appearing HEENT: Atraumatic and Normocephalic.  PERRLA. EOM intact.  TM clear B. Mouth-MMM, post pharynx WNL w/ only mild erythema, + PND. Neck: supple, no JVD. No cervical lymphadenopathy. No thyromegaly Chest/Lungs:  Breathing-non-labored, Good air entry bilaterally, breath sounds with occasional scattered mild wheezes; no rhonchi or rales CVS: S1 S2 regular, no murmurs, gallops, rubs  Extremities: Bilateral Lower Ext shows no edema, both legs  are warm to touch with = pulse throughout Neurology:  CN II-XII grossly intact, Non focal.   Psych:  TP linear. J/I WNL. Normal speech. Appropriate eye contact and affect.  Skin:  No Rash  Data Review Lab Results  Component Value Date   HGBA1C 6.1 04/15/2017   HGBA1C 6.3 04/08/2016   HGBA1C 6.50 01/12/2016     Assessment & Plan   1. Hypertension due to endocrine disorder Not controlled today.  She will check her BP out of the office and schedule an appt if her numbers are consistently >130/85.  Otherwise, continue current regimen  2. Diabetes mellitus without complication (HCC) Adequate control.  Continue current regimen.    3. Bronchitis URI/respiratory care - azithromycin (ZITHROMAX) 250 MG tablet; Take 2 today then 1 daily  Dispense: 6 tablet; Refill: 0 - benzonatate (TESSALON) 100 MG capsule; Take 2 capsules (200 mg total) by mouth 2 (two) times daily as needed for cough.  Dispense: 40 capsule; Refill: 0 - albuterol (PROVENTIL HFA;VENTOLIN HFA) 108 (90 Base) MCG/ACT inhaler; Inhale 2 puffs into the lungs every 4 (four) hours as needed for wheezing.  Dispense: 1 Inhaler; Refill: 0   Patient have been counseled extensively about nutrition and exercise  Return in about 3 months (around 08/01/2017) for assign new PCP; BP check and DM.  The patient was given clear instructions to go to ER or return to medical center if symptoms don't improve, worsen or new problems develop. The patient verbalized understanding. The patient was told to call to get lab results if they haven't heard anything in the next week.     Georgian CoAngela Elliotte Marsalis, PA-C [facility] [place], [place] [phone]   05/01/2017, 12:51 PMPatient ID: Linda Cooper, female   DOB: 09-01-1961, 56 y.o.   MRN: [id number]

## 2017-05-15 ENCOUNTER — Ambulatory Visit (HOSPITAL_COMMUNITY)
Admission: EM | Admit: 2017-05-15 | Discharge: 2017-05-15 | Disposition: A | Discharge status: Home / Self Care | Payer: BLUE CROSS/BLUE SHIELD | Attending: Internal Medicine | Admitting: Internal Medicine

## 2017-05-15 ENCOUNTER — Encounter (HOSPITAL_COMMUNITY): Payer: Self-pay | Admitting: Nurse Practitioner

## 2017-05-15 DIAGNOSIS — M25562 Pain in left knee: Secondary | ICD-10-CM | POA: Diagnosis not present

## 2017-05-15 DIAGNOSIS — M1712 Unilateral primary osteoarthritis, left knee: Secondary | ICD-10-CM | POA: Diagnosis not present

## 2017-05-15 MED ORDER — TRIAMCINOLONE ACETONIDE 40 MG/ML IJ SUSP
40.0000 mg | Freq: Once | INTRAMUSCULAR | Status: AC
  Administered 2017-05-15: 40 mg via INTRA_ARTICULAR

## 2017-05-15 MED ORDER — TRIAMCINOLONE ACETONIDE 40 MG/ML IJ SUSP
INTRAMUSCULAR | Status: AC
  Filled 2017-05-15: qty 1

## 2017-05-15 NOTE — ED Triage Notes (Signed)
Pt presents with c/o left knee pain. The pain is chronic in nature. Yesterday she noticed increased pain in her left knee. She also noticed new onset of tingling in her left foot. She has been trying to exercise and lose weight with some improvement in the knee pain over the past several months. She denies any recent exertion or injury. She has taken aleve and ice and applied brace with no relief of the pain.

## 2017-05-15 NOTE — ED Provider Notes (Signed)
CSN: [id number]     Arrival date & time 05/15/17  1056 History   None    Chief Complaint  Patient presents with  . Knee Pain   (Consider location/radiation/quality/duration/timing/severity/associated sxs/prior Treatment) Patient c/o chronic left knee pain and arthritis.     The history is provided by the patient.  Knee Pain  Location:  Knee Injury: no   Knee location:  L knee Pain details:    Quality:  Aching   Radiates to:  Does not radiate   Severity:  Mild   Onset quality:  Sudden   Duration:  1 week   Timing:  Constant   Progression:  Worsening Chronicity:  Chronic   Past Medical History:  Diagnosis Date  . Diabetes mellitus without complication (HCC) Dx 2007  . Hypertension Dx 1990  . Rheumatic fever childhood    has intermittent murmur and hand arthritis    Past Surgical History:  Procedure Laterality Date  . ABDOMINAL HYSTERECTOMY  2006   . HERNIA REPAIR  2006    Umbilical    Family History  Problem Relation Age of Onset  . Hypertension Mother   . Diabetes Mother   . Stroke Mother   . Hypertension Father   . Diabetes Father   . Stroke Father   . Diabetes Brother   . Hypertension Brother   . Gout Brother   . Heart disease Sister   . Cancer Neg Hx    Social History  Substance Use Topics  . Smoking status: Never Smoker  . Smokeless tobacco: Never Used  . Alcohol use No   OB History    No data available     Review of Systems  Constitutional: Negative.   HENT: Negative.   Eyes: Negative.   Respiratory: Negative.   Cardiovascular: Negative.   Gastrointestinal: Negative.   Endocrine: Negative.   Genitourinary: Negative.   Musculoskeletal: Positive for arthralgias.  Allergic/Immunologic: Negative.   Neurological: Negative.   Hematological: Negative.   Psychiatric/Behavioral: Negative.     Allergies  Ace inhibitors and Erythromycin base  Home Medications   Prior to Admission medications   Medication Sig Start Date End Date Taking?  Authorizing Provider  albuterol (PROVENTIL HFA;VENTOLIN HFA) 108 (90 Base) MCG/ACT inhaler Inhale 2 puffs into the lungs every 4 (four) hours as needed for wheezing. 05/01/17   Anders Simmonds, PA-C  azithromycin (ZITHROMAX) 250 MG tablet Take 2 today then 1 daily 05/01/17   Anders Simmonds, PA-C  benzonatate (TESSALON) 100 MG capsule Take 2 capsules (200 mg total) by mouth 2 (two) times daily as needed for cough. 05/01/17   Anders Simmonds, PA-C  glucose blood test strip Use as instructed 03/06/17   Dessa Phi, MD  hydrALAZINE (APRESOLINE) 25 MG tablet Take 1 tablet (25 mg total) by mouth 2 (two) times daily. 03/11/16   Funches, Gerilyn Nestle, MD  losartan-hydrochlorothiazide (HYZAAR) 100-25 MG tablet TAKE 1 TABLET BY MOUTH DAILY. 04/21/17   Funches, Gerilyn Nestle, MD  metFORMIN (GLUCOPHAGE-XR) 500 MG 24 hr tablet TAKE 2 TABLETS BY MOUTH DAILY AFTER SUPPER. 04/21/17   Funches, Gerilyn Nestle, MD  Multiple Vitamins-Minerals (MULTIVITAMIN WITH MINERALS) tablet Take 1 tablet by mouth daily.    [provider]  naproxen sodium (ANAPROX) 220 MG tablet Take 440 mg by mouth 2 (two) times daily with a meal.    [provider]  Omega 3 1200 MG CAPS Take by mouth.    [provider]  [facility] DELICA LANCETS 33G MISC USE TO TEST BLOOD  SUGAR 3 TIMES DAILY. Patient not taking: Reported on 04/15/2017 03/06/17   Dessa PhiFunches, Josalyn, MD  vitamin B-12 (CYANOCOBALAMIN) 1000 MCG tablet Take 1,000 mcg by mouth daily.    [provider]  Vitamin D, Ergocalciferol, (DRISDOL) 50000 UNITS CAPS capsule Take 50,000 Units by mouth every 7 (seven) days.    [provider]   Meds Ordered and Administered this Visit   Medications  triamcinolone acetonide (KENALOG-40) injection 40 mg (40 mg Intra-articular Given by Other 05/15/17 1227)    BP 137/88 (BP Location: Left Arm)   Pulse 89   Temp 98.2 F (36.8 C) (Oral)   Resp 16   SpO2 97%  No data found.   Physical Exam  Constitutional: She  appears well-developed and well-nourished.  HENT:  Head: Normocephalic and atraumatic.  Eyes: Conjunctivae and EOM are normal. Pupils are equal, round, and reactive to light.  Neck: Normal range of motion. Neck supple.  Cardiovascular: Normal rate, regular rhythm and normal heart sounds.   Pulmonary/Chest: Effort normal and breath sounds normal.  Musculoskeletal: She exhibits tenderness.  TTP left knee  Nursing note and vitals reviewed.   Urgent Care Course     .Joint Aspiration/Arthrocentesis Date/Time: 05/15/2017 1:04 PM Performed by: Deatra CanterXFORD, Rajan Burgard J Authorized by: Eustace MooreMURRAY, LAURA W   Consent:    Consent obtained:  Verbal   Consent given by:  Patient   Risks discussed:  Bleeding and infection   Alternatives discussed:  No treatment Location:    Location:  Knee   Knee:  L knee Procedure details:    Needle gauge:  22 G   Ultrasound guidance: no     Approach:  Lateral   Steroid injected: yes     Specimen collected: no   Post-procedure details:    Patient tolerance of procedure:  Tolerated well, no immediate complications   (including critical care time)  Labs Review Labs Reviewed - No data to display  Imaging Review No results found.   Visual Acuity Review  Right Eye Distance:   Left Eye Distance:   Bilateral Distance:    Right Eye Near:   Left Eye Near:    Bilateral Near:         MDM   1. Acute pain of left knee   2. Arthritis of knee, left    Left knee injected with 2 cc's of bupivacaine 2 cc's of 2% lidocaine and 1cc of kenalog 40mg  injected left knee.      Deatra CanterOxford, Zhion Pevehouse J, [place]FNP 05/15/17 [phone]

## 2017-05-20 ENCOUNTER — Ambulatory Visit: Payer: BLUE CROSS/BLUE SHIELD | Attending: Family Medicine | Admitting: Family Medicine

## 2017-05-20 ENCOUNTER — Encounter: Payer: Self-pay | Admitting: Family Medicine

## 2017-05-20 VITALS — BP 138/87 | HR 101 | Temp 97.9°F | Wt 286.4 lb

## 2017-05-20 DIAGNOSIS — R0989 Other specified symptoms and signs involving the circulatory and respiratory systems: Secondary | ICD-10-CM | POA: Insufficient documentation

## 2017-05-20 DIAGNOSIS — M170 Bilateral primary osteoarthritis of knee: Secondary | ICD-10-CM

## 2017-05-20 DIAGNOSIS — E119 Type 2 diabetes mellitus without complications: Secondary | ICD-10-CM | POA: Diagnosis not present

## 2017-05-20 DIAGNOSIS — R05 Cough: Secondary | ICD-10-CM | POA: Diagnosis not present

## 2017-05-20 DIAGNOSIS — Z79899 Other long term (current) drug therapy: Secondary | ICD-10-CM | POA: Diagnosis not present

## 2017-05-20 DIAGNOSIS — I10 Essential (primary) hypertension: Secondary | ICD-10-CM | POA: Diagnosis not present

## 2017-05-20 DIAGNOSIS — R053 Chronic cough: Secondary | ICD-10-CM

## 2017-05-20 DIAGNOSIS — Z7984 Long term (current) use of oral hypoglycemic drugs: Secondary | ICD-10-CM | POA: Diagnosis not present

## 2017-05-20 LAB — GLUCOSE, POCT (MANUAL RESULT ENTRY): POC Glucose: 95 mg/dl (ref 70–99)

## 2017-05-20 MED ORDER — DOXYCYCLINE HYCLATE 100 MG PO TABS: 100.0000 mg | ORAL_TABLET | Freq: Two times a day (BID) | ORAL | 0 refills | Status: DC

## 2017-05-20 MED ORDER — PROMETHAZINE-DM 6.25-15 MG/5ML PO SYRP: 5.0000 mL | ORAL_SOLUTION | Freq: Four times a day (QID) | ORAL | 0 refills | Status: DC | PRN

## 2017-05-20 MED FILL — DOXYCYCLINE 100 MG TABLET: 100 | 10 days supply | Qty: 20 | Fill #0

## 2017-05-20 MED FILL — PROMETHAZINE-DM SYRUP: 6.25-15 | 6 days supply | Qty: 118 | Fill #0

## 2017-05-20 NOTE — Progress Notes (Signed)
Subjective:  Patient ID: Linda Cooper, female    DOB: 1960/12/16  Age: 56 y.o. MRN: [id number]  CC: Cough   HPI Linda Cooper  Has morbid obesity, HTN, well controlled diabetes, OA in both knees she presents for   1. Cough and  chest congestion: started 3-4 weeks ago. Cough was productive of grey sputum. She was prescribed azithromycin. She reports she felt better after the azithromycin. The cough returned about 2 weeks ago. Cough is now intermittently productive, clear to white. She denies fever. She noticed feeling cold today. She denies chest pain and shortness of breath.   She also endorses skin peeling and rough feeling of her thumb and first two fingers.   2. UC f/u knee pain: she developed worsening knee pain on 05/13/17 in the L knee. She could not bend it without pain. She went to urgent care on 05/15/17. She received a steroid injection into her L knee. The pain improved. She is able to bend more comfortably. She still feels pain in her posterior knee.   Social History  Substance Use Topics  . Smoking status: Never Smoker  . Smokeless tobacco: Never Used  . Alcohol use No    Outpatient Medications Prior to Visit  Medication Sig Dispense Refill  . albuterol (PROVENTIL HFA;VENTOLIN HFA) 108 (90 Base) MCG/ACT inhaler Inhale 2 puffs into the lungs every 4 (four) hours as needed for wheezing. 1 Inhaler 0  . azithromycin (ZITHROMAX) 250 MG tablet Take 2 today then 1 daily 6 tablet 0  . benzonatate (TESSALON) 100 MG capsule Take 2 capsules (200 mg total) by mouth 2 (two) times daily as needed for cough. 40 capsule 0  . glucose blood test strip Use as instructed 100 each 12  . hydrALAZINE (APRESOLINE) 25 MG tablet Take 1 tablet (25 mg total) by mouth 2 (two) times daily. 60 tablet 5  . losartan-hydrochlorothiazide (HYZAAR) 100-25 MG tablet TAKE 1 TABLET BY MOUTH DAILY. 30 tablet 2  . metFORMIN (GLUCOPHAGE-XR) 500 MG 24 hr tablet TAKE 2 TABLETS BY MOUTH DAILY AFTER SUPPER. 60  tablet 2  . Multiple Vitamins-Minerals (MULTIVITAMIN WITH MINERALS) tablet Take 1 tablet by mouth daily.    . naproxen sodium (ANAPROX) 220 MG tablet Take 440 mg by mouth 2 (two) times daily with a meal.    . Omega 3 1200 MG CAPS Take by mouth.    Letta Pate. ONETOUCH DELICA LANCETS 33G MISC USE TO TEST BLOOD SUGAR 3 TIMES DAILY. (Patient not taking: Reported on 04/15/2017) 1 each 11  . vitamin B-12 (CYANOCOBALAMIN) 1000 MCG tablet Take 1,000 mcg by mouth daily.    . Vitamin D, Ergocalciferol, (DRISDOL) 50000 UNITS CAPS capsule Take 50,000 Units by mouth every 7 (seven) days.     No facility-administered medications prior to visit.     ROS Review of Systems  Constitutional: Negative for chills and fever.  HENT: Positive for congestion.   Eyes: Negative for visual disturbance.  Respiratory: Positive for cough. Negative for shortness of breath.   Cardiovascular: Negative for chest pain.  Gastrointestinal: Negative for abdominal pain and blood in stool.  Musculoskeletal: Positive for arthralgias. Negative for back pain.  Skin: Negative for rash.  Allergic/Immunologic: Negative for immunocompromised state.  Hematological: Negative for adenopathy. Does not bruise/bleed easily.  Psychiatric/Behavioral: Negative for dysphoric mood and suicidal ideas.    Objective:  BP 138/87   Pulse (!) 101   Temp 97.9 F (36.6 C) (Oral)   Wt 286 lb 6.4 oz (129.9 kg)  SpO2 97%   BMI 52.38 kg/m   BP/Weight 05/20/2017 05/15/2017 05/01/2017  Systolic BP 138 137 154  Diastolic BP 87 88 88  Wt. (Lbs) 286.4 - 289.6  BMI 52.38 - 52.97    Physical Exam  Constitutional: She is oriented to person, place, and time. She appears well-developed and well-nourished. No distress.  HENT:  Head: Normocephalic and atraumatic.  Right Ear: Tympanic membrane, external ear and ear canal normal.  Left Ear: Tympanic membrane, external ear and ear canal normal.  Nose: Mucosal edema present.  Mouth/Throat: Oropharynx is clear and  moist and mucous membranes are normal.  Cardiovascular: Normal rate, regular rhythm, normal heart sounds and intact distal pulses.   Pulmonary/Chest: Effort normal and breath sounds normal.  Musculoskeletal: She exhibits no edema.  Neurological: She is alert and oriented to person, place, and time.  Skin: Skin is warm and dry. No rash noted.  Psychiatric: She has a normal mood and affect.    Lab Results  Component Value Date   HGBA1C 6.1 04/15/2017   CBG 104  Assessment & Plan:  Linda Cooper was seen today for cough.  Diagnoses and all orders for this visit:  Diabetes mellitus without complication (HCC) -     POCT glucose (manual entry)  Persistent cough for 3 weeks or longer -     promethazine-dextromethorphan (PROMETHAZINE-DM) 6.25-15 MG/5ML syrup; Take 5 mLs by mouth 4 (four) times daily as needed for cough. -     doxycycline (VIBRA-TABS) 100 MG tablet; Take 1 tablet (100 mg total) by mouth 2 (two) times daily. -     DG Chest 2 View; Future  Primary osteoarthritis of both knees -     Ambulatory referral to Orthopedic Surgery   There are no diagnoses linked to this encounter.  No orders of the defined types were placed in this encounter.   Follow-up: Return in about 3 weeks (around 06/10/2017) for cough.   Dessa Phi MD

## 2017-05-20 NOTE — Patient Instructions (Addendum)
Linda Cooper was seen today for cough.  Diagnoses and all orders for this visit:  Diabetes mellitus without complication (HCC) -     POCT glucose (manual entry)  Persistent cough for 3 weeks or longer -     promethazine-dextromethorphan (PROMETHAZINE-DM) 6.25-15 MG/5ML syrup; Take 5 mLs by mouth 4 (four) times daily as needed for cough. -     doxycycline (VIBRA-TABS) 100 MG tablet; Take 1 tablet (100 mg total) by mouth 2 (two) times daily. -     DG Chest 2 View; Future  Primary osteoarthritis of both knees -     Ambulatory referral to Orthopedic Surgery   F/u in 3 weeks for cough   Dr. Armen PickupFunches

## 2017-05-21 ENCOUNTER — Ambulatory Visit
Admission: RE | Admit: 2017-05-21 | Discharge: 2017-05-21 | Disposition: A | Discharge status: Home / Self Care | Payer: BLUE CROSS/BLUE SHIELD | Source: Ambulatory Visit | Attending: Family Medicine | Admitting: Family Medicine

## 2017-05-21 DIAGNOSIS — R05 Cough: Secondary | ICD-10-CM | POA: Insufficient documentation

## 2017-05-21 DIAGNOSIS — R053 Chronic cough: Secondary | ICD-10-CM | POA: Insufficient documentation

## 2017-05-21 NOTE — Assessment & Plan Note (Signed)
Worsening of chronic pain L >R Ortho referral placed

## 2017-05-21 NOTE — Assessment & Plan Note (Signed)
persistent cough with congestion CXR normal Suspect bronchitis  Plan: Promethazine-DM for cough course of doxycycline

## 2017-05-27 ENCOUNTER — Other Ambulatory Visit: Payer: Self-pay | Admitting: Family Medicine

## 2017-05-27 DIAGNOSIS — I152 Hypertension secondary to endocrine disorders: Secondary | ICD-10-CM

## 2017-05-27 MED FILL — hydrALAZINE HCL 25 MG TABS: 25 | 30 days supply | Qty: 90 | Fill #0

## 2017-05-27 MED FILL — LOSARTAN-HCTZ 100-25 MG TAB: 100-25 | 30 days supply | Qty: 30 | Fill #1

## 2017-06-03 ENCOUNTER — Telehealth: Payer: Self-pay

## 2017-06-03 NOTE — Telephone Encounter (Signed)
Pt was called and informed of xray results. 

## 2017-06-17 ENCOUNTER — Encounter: Payer: Self-pay | Admitting: Family Medicine

## 2017-06-17 ENCOUNTER — Ambulatory Visit: Payer: BLUE CROSS/BLUE SHIELD | Attending: Family Medicine | Admitting: Family Medicine

## 2017-06-17 VITALS — BP 152/97 | HR 94 | Temp 97.9°F | Ht 62.0 in | Wt 285.2 lb

## 2017-06-17 DIAGNOSIS — M170 Bilateral primary osteoarthritis of knee: Secondary | ICD-10-CM | POA: Insufficient documentation

## 2017-06-17 DIAGNOSIS — I10 Essential (primary) hypertension: Secondary | ICD-10-CM | POA: Insufficient documentation

## 2017-06-17 DIAGNOSIS — Z7984 Long term (current) use of oral hypoglycemic drugs: Secondary | ICD-10-CM | POA: Diagnosis not present

## 2017-06-17 DIAGNOSIS — R05 Cough: Secondary | ICD-10-CM | POA: Diagnosis not present

## 2017-06-17 DIAGNOSIS — E119 Type 2 diabetes mellitus without complications: Secondary | ICD-10-CM | POA: Diagnosis not present

## 2017-06-17 DIAGNOSIS — Z79899 Other long term (current) drug therapy: Secondary | ICD-10-CM | POA: Insufficient documentation

## 2017-06-17 DIAGNOSIS — R053 Chronic cough: Secondary | ICD-10-CM

## 2017-06-17 LAB — GLUCOSE, POCT (MANUAL RESULT ENTRY): POC Glucose: 104 mg/dl — AB (ref 70–99)

## 2017-06-17 NOTE — Progress Notes (Signed)
Subjective:  Patient ID: Linda Cooper, female    DOB: 1961-09-13  Age: 56 y.o. MRN: [id number]  CC: Cough   HPI [place]  Has morbid obesity, HTN, well controlled diabetes, OA in both knees she presents for   1. Cough and  chest congestion: started in 04/2017. Cough was productive of grey sputum. She was prescribed azithromycin. She reports she felt better after the azithromycin. The cough returned about 2 weeks ago. Cough is now intermittently productive, clear to white. She denies fever. She noticed feeling cold today. She denies chest pain and shortness of breath.  She took additional promethazine-DM and a course of doxycyline. Her CXR was negative for acute infiltrate.   Today, she reports cough has resolved and there is no shortness of breath.   2. Knee pain: knees still hurt. She took an Training and development officer and that helped. She received a call from orthopaedic and will schedule an appointment.   Social History  Substance Use Topics  . Smoking status: Never Smoker  . Smokeless tobacco: Never Used  . Alcohol use No    Outpatient Medications Prior to Visit  Medication Sig Dispense Refill  . albuterol (PROVENTIL HFA;VENTOLIN HFA) 108 (90 Base) MCG/ACT inhaler Inhale 2 puffs into the lungs every 4 (four) hours as needed for wheezing. 1 Inhaler 0  . glucose blood test strip Use as instructed 100 each 12  . hydrALAZINE (APRESOLINE) 25 MG tablet Take 1 tablet (25 mg total) by mouth 2 (two) times daily. 60 tablet 5  . hydrALAZINE (APRESOLINE) 25 MG tablet TAKE 1 TABLET BY MOUTH 3 TIMES DAILY. 90 tablet 2  . losartan-hydrochlorothiazide (HYZAAR) 100-25 MG tablet TAKE 1 TABLET BY MOUTH DAILY. 30 tablet 2  . metFORMIN (GLUCOPHAGE-XR) 500 MG 24 hr tablet TAKE 2 TABLETS BY MOUTH DAILY AFTER SUPPER. 60 tablet 2  . naproxen sodium (ANAPROX) 220 MG tablet Take 440 mg by mouth 2 (two) times daily with a meal.    . Omega 3 1200 MG CAPS Take by mouth.    . benzonatate (TESSALON) 100 MG capsule  Take 2 capsules (200 mg total) by mouth 2 (two) times daily as needed for cough. (Patient not taking: Reported on 06/17/2017) 40 capsule 0  . doxycycline (VIBRA-TABS) 100 MG tablet Take 1 tablet (100 mg total) by mouth 2 (two) times daily. (Patient not taking: Reported on 06/17/2017) 20 tablet 0  . Multiple Vitamins-Minerals (MULTIVITAMIN WITH MINERALS) tablet Take 1 tablet by mouth daily.    Letta Pate DELICA LANCETS 33G MISC USE TO TEST BLOOD SUGAR 3 TIMES DAILY. (Patient not taking: Reported on 04/15/2017) 1 each 11  . promethazine-dextromethorphan (PROMETHAZINE-DM) 6.25-15 MG/5ML syrup Take 5 mLs by mouth 4 (four) times daily as needed for cough. (Patient not taking: Reported on 06/17/2017) 118 mL 0  . vitamin B-12 (CYANOCOBALAMIN) 1000 MCG tablet Take 1,000 mcg by mouth daily.    . Vitamin D, Ergocalciferol, (DRISDOL) 50000 UNITS CAPS capsule Take 50,000 Units by mouth every 7 (seven) days.     No facility-administered medications prior to visit.     ROS Review of Systems  Constitutional: Negative for chills and fever.  HENT: Negative for congestion.   Eyes: Negative for visual disturbance.  Respiratory: Negative for cough and shortness of breath.   Cardiovascular: Negative for chest pain.  Gastrointestinal: Negative for abdominal pain and blood in stool.  Musculoskeletal: Positive for arthralgias. Negative for back pain.  Skin: Negative for rash.  Allergic/Immunologic: Negative for immunocompromised state.  Hematological:  Negative for adenopathy. Does not bruise/bleed easily.  Psychiatric/Behavioral: Negative for dysphoric mood and suicidal ideas.    Objective:  BP (!) 152/97   Pulse 94   Temp 97.9 F (36.6 C) (Oral)   Ht 5\' 2"  (1.575 m)   Wt 285 lb 3.2 oz (129.4 kg)   SpO2 98%   BMI 52.16 kg/m   BP/Weight 06/17/2017 05/20/2017 05/15/2017  Systolic BP 152 138 137  Diastolic BP 97 87 88  Wt. (Lbs) 285.2 286.4 -  BMI 52.16 52.38 -    Physical Exam  Constitutional: She is  oriented to person, place, and time. She appears well-developed and well-nourished. No distress.  HENT:  Head: Normocephalic and atraumatic.  Right Ear: Tympanic membrane, external ear and ear canal normal.  Left Ear: Tympanic membrane, external ear and ear canal normal.  Mouth/Throat: Oropharynx is clear and moist and mucous membranes are normal.  Cardiovascular: Normal rate, regular rhythm, normal heart sounds and intact distal pulses.   Pulmonary/Chest: Effort normal and breath sounds normal.  Musculoskeletal: She exhibits no edema.  Neurological: She is alert and oriented to person, place, and time.  Skin: Skin is warm and dry. No rash noted.  Psychiatric: She has a normal mood and affect.    Lab Results  Component Value Date   HGBA1C 6.1 04/15/2017   CBG 104  Assessment & Plan:  [place] was seen today for cough.  Diagnoses and all orders for this visit:  Diabetes mellitus without complication (HCC) -     POCT glucose (manual entry)  Persistent cough for 3 weeks or longer  Primary osteoarthritis of both knees   There are no diagnoses linked to this encounter.  No orders of the defined types were placed in this encounter.   Follow-up: Return in about 4 weeks (around 07/15/2017) for f/u ortho appointment and diabetes .   Dessa PhiJosalyn Ajane Novella MD

## 2017-06-17 NOTE — Patient Instructions (Addendum)
Linda Cooper was seen today for cough.  Diagnoses and all orders for this visit:  Diabetes mellitus without complication (HCC) -     POCT glucose (manual entry)  Persistent cough for 3 weeks or longer  Primary osteoarthritis of both knees  be sure to call back to orthopedics as soon as possible for an appointment.  Lungs are clear and cough has resolved.   Dr. Armen PickupFunches   [facility] July 28, 2017 [facility] You are welcome to follow up with me there if you like. [facility] accepts insurance and self pay.   Sales promotion account executiveBethany at [facility]  [address] [place], [place] [address]  Ph: [phone] Fax: [phone]

## 2017-06-17 NOTE — Assessment & Plan Note (Signed)
Patient to see ortho

## 2017-06-17 NOTE — Assessment & Plan Note (Signed)
Resolved bronchitis.  

## 2017-06-19 MED FILL — TRUE METRIX TEST STRIP: 30 days supply | Qty: 100 | Fill #1

## 2017-06-19 MED FILL — METFORMIN HCL ER 500 MG TAB: 500 | 30 days supply | Qty: 60 | Fill #1

## 2017-07-03 ENCOUNTER — Other Ambulatory Visit: Payer: Self-pay | Admitting: Family Medicine

## 2017-07-03 ENCOUNTER — Ambulatory Visit (INDEPENDENT_AMBULATORY_CARE_PROVIDER_SITE_OTHER): Payer: BLUE CROSS/BLUE SHIELD | Admitting: Orthopedic Surgery

## 2017-07-03 ENCOUNTER — Encounter (INDEPENDENT_AMBULATORY_CARE_PROVIDER_SITE_OTHER): Payer: Self-pay | Admitting: Orthopedic Surgery

## 2017-07-03 DIAGNOSIS — I152 Hypertension secondary to endocrine disorders: Secondary | ICD-10-CM

## 2017-07-03 DIAGNOSIS — M170 Bilateral primary osteoarthritis of knee: Secondary | ICD-10-CM

## 2017-07-03 MED FILL — LOSARTAN-HCTZ 100-25 MG TAB: 100-25 | 30 days supply | Qty: 30 | Fill #2

## 2017-07-03 NOTE — Progress Notes (Signed)
Office Visit Note   Patient: Linda CleaverVictoria J Cooper           Date of Birth: Feb 05, 1961           MRN: [id number] Visit Date: 07/03/2017 Requested by: Dessa PhiFunches, Josalyn, MD [address] [place], [place] [id number] PCP: Dessa PhiFunches, Josalyn, MD  Subjective: Chief Complaint  Patient presents with  . Right Knee - Pain  . 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  She states the pain is getting much worse.  She has had 4 cortisone injections over the past 2 years into which have given her some relief.  She has been working on weight loss.  She is  Shows use braces but not as often as she used to.  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  She takes Aleve for pain              ROS: All systems reviewed are negative as they relate to the chief complaint within the history of present illness.  Patient denies  fevers or chills.   Assessment & Plan: Visit Diagnoses:  1. Primary osteoarthritis of both knees     Plan: impression is bilateral knee pain left worse than right.  End-stage tricompartmental arthritis is present but she's getting some early joint subluxation on the left-hand side.  Plan is to pre-approve for gel injectionsand continue weight loss and work on quad strengthening in a nonweightbearing fashion.  She will need knee replacement sometime in the future.    I would like to do the gel injections once they are approved.  Follow-Up Instructions: No Follow-up on file.   Orders:  No orders of the defined types were placed in this encounter.  No orders of the defined types were placed in this encounter.     Procedures: No procedures performed   Clinical Data: No additional findings.  Objective: Vital Signs: There were no vitals taken for this visit.  Physical Exam:   Constitutional: Patient appears well-developed HEENT:  Head: Normocephalic Eyes:EOM are normal Neck: Normal range of  motion Cardiovascular: Normal rate Pulmonary/chest: Effort normal Neurologic: Patient is alert Skin: Skin is warm Psychiatric: Patient has normal mood and affect    Ortho Exam: orthopedic exam demonstrates increased body mass index light varus alignment and bilateral lower extremity.  Pedal pulses palpable.  No effusion is present in either knee.  She has range of motion on the right about 7 to 95.  On the left is about 10 to 80.  Extensor mechanism is intact bilaterally.  Not much groin pain with internal/external rotation of either leg.  Collateral and cruciate ligaments are stable.  Specialty Comments:  No specialty comments available.  Imaging: No results found.   PMFS History: Patient Active Problem List   Diagnosis Date Noted  . Morbid obesity (HCC) 11/11/2014  . PCOS (polycystic ovarian syndrome) 11/10/2014  . Degenerative arthritis of knee, bilateral 11/10/2014  . Diabetes mellitus without complication (HCC)   . Hypertension    Past Medical History:  Diagnosis Date  . Diabetes mellitus without complication (HCC) Dx 2007  . Hypertension Dx 1990  . Rheumatic fever childhood    has intermittent murmur and hand arthritis     Family History  Problem Relation Age of Onset  . Hypertension Mother   . Diabetes Mother   . Stroke Mother   . Hypertension Father   .  Diabetes Father   . Stroke Father   . Diabetes Brother   . Hypertension Brother   . Gout Brother   . Heart disease Sister   . Cancer Neg Hx     Past Surgical History:  Procedure Laterality Date  . ABDOMINAL HYSTERECTOMY  2006   . HERNIA REPAIR  2006    Umbilical    Social History   Occupational History  . [employer]      [employer], in home care,    Social History Main Topics  . Smoking status: Never Smoker  . Smokeless tobacco: Never Used  . Alcohol use No  . Drug use: No  . Sexual activity: Not on file

## 2017-07-15 ENCOUNTER — Encounter: Payer: Self-pay | Admitting: Family Medicine

## 2017-07-15 ENCOUNTER — Ambulatory Visit: Payer: BLUE CROSS/BLUE SHIELD | Attending: Family Medicine | Admitting: Family Medicine

## 2017-07-15 VITALS — BP 162/98 | HR 69 | Temp 98.4°F | Ht 62.0 in | Wt 285.2 lb

## 2017-07-15 DIAGNOSIS — E119 Type 2 diabetes mellitus without complications: Secondary | ICD-10-CM | POA: Diagnosis not present

## 2017-07-15 DIAGNOSIS — Z7984 Long term (current) use of oral hypoglycemic drugs: Secondary | ICD-10-CM | POA: Diagnosis not present

## 2017-07-15 DIAGNOSIS — M170 Bilateral primary osteoarthritis of knee: Secondary | ICD-10-CM

## 2017-07-15 DIAGNOSIS — I10 Essential (primary) hypertension: Secondary | ICD-10-CM | POA: Diagnosis not present

## 2017-07-15 DIAGNOSIS — I152 Hypertension secondary to endocrine disorders: Secondary | ICD-10-CM

## 2017-07-15 LAB — POCT GLYCOSYLATED HEMOGLOBIN (HGB A1C): Hemoglobin A1C: 5.8

## 2017-07-15 LAB — GLUCOSE, POCT (MANUAL RESULT ENTRY): POC Glucose: 98 mg/dl (ref 70–99)

## 2017-07-15 MED ORDER — METFORMIN HCL ER 500 MG PO TB24: 500.0000 mg | ORAL_TABLET | Freq: Every day | ORAL | 11 refills | Status: DC

## 2017-07-15 MED ORDER — HYDRALAZINE HCL 25 MG PO TABS: 25.0000 mg | ORAL_TABLET | Freq: Three times a day (TID) | ORAL | 5 refills | Status: DC

## 2017-07-15 MED FILL — hydrALAZINE HCL 25 MG TABS: 25 | 30 days supply | Qty: 90 | Fill #0

## 2017-07-15 MED FILL — METFORMIN HCL ER 500 MG TAB: 500 | 30 days supply | Qty: 30 | Fill #0

## 2017-07-15 NOTE — Patient Instructions (Addendum)
Linda Cooper was seen today for hypertension and diabetes.  Diagnoses and all orders for this visit:  Diabetes mellitus without complication (HCC) -     POCT glucose (manual entry) -     POCT glycosylated hemoglobin (Hb A1C) -     metFORMIN (GLUCOPHAGE-XR) 500 MG 24 hr tablet; Take 1 tablet (500 mg total) by mouth daily with breakfast.  Hypertension due to endocrine disorder -     hydrALAZINE (APRESOLINE) 25 MG tablet; Take 1 tablet (25 mg total) by mouth 3 (three) times daily.  To help overcome weight loss platuea, fast from carbohydrates for the next 5-7 days with carbs only from veggies and fruits  Cut out salt as much as possible for blood pressure reduction   F/u in 6 weeks for HTN and flu shot   Dr. Armen PickupFunches

## 2017-07-15 NOTE — Assessment & Plan Note (Signed)
A: well controlled with A2c now 5.8% P: Reduce metformin to 500 mg daily  If A1c< 5.7%, metformin can be stopped

## 2017-07-15 NOTE — Progress Notes (Signed)
Subjective:  Patient ID: Linda Cooper, female    DOB: 1961/05/13  Age: 56 y.o. MRN: [id number]  CC: Hypertension and Diabetes   HPI [place]  Has morbid obesity, HTN, well controlled diabetes, OA in both knees she presents for    1. CHRONIC HYPERTENSION  Disease Monitoring  Blood pressure range: not checking   Chest pain: no   Dyspnea: no   Claudication: no   Medication compliance: yes, hydralazine 25 mg three times a day. hyzaar 100-25 mg once daily  Medication Side Effects  Lightheadedness: no   Urinary frequency: no   Edema: yes, with prolonged standing     Preventitive Healthcare:  Exercise: minimal    Diet Pattern: eating low carb most of the time. Eating out often over the last 2 weeks while moving her mother.   Salt Restriction: no   2. CHRONIC DIABETES  Disease Monitoring  Blood Sugar Ranges: no  Polyuria: no   Visual problems: no   Medication Compliance: yes, takes 1000 mg of metformin in the morning.  Medication Side Effects  Hypoglycemia: no   Preventitive Health Care  Eye Exam: due   Foot Exam: done recently    2. Knee pain: knees still hurt. She also has some right sided hip pain.   Social History  Substance Use Topics  . Smoking status: Never Smoker  . Smokeless tobacco: Never Used  . Alcohol use No    Outpatient Medications Prior to Visit  Medication Sig Dispense Refill  . albuterol (PROVENTIL HFA;VENTOLIN HFA) 108 (90 Base) MCG/ACT inhaler Inhale 2 puffs into the lungs every 4 (four) hours as needed for wheezing. 1 Inhaler 0  . glucose blood test strip Use as instructed 100 each 12  . hydrALAZINE (APRESOLINE) 25 MG tablet Take 1 tablet (25 mg total) by mouth 2 (two) times daily. 60 tablet 5  . hydrALAZINE (APRESOLINE) 25 MG tablet TAKE 1 TABLET BY MOUTH 3 TIMES DAILY. 90 tablet 2  . losartan-hydrochlorothiazide (HYZAAR) 100-25 MG tablet TAKE 1 TABLET BY MOUTH DAILY. 30 tablet 2  . metFORMIN (GLUCOPHAGE-XR) 500 MG 24 hr tablet  TAKE 2 TABLETS BY MOUTH DAILY AFTER SUPPER. 60 tablet 2  . Multiple Vitamins-Minerals (MULTIVITAMIN WITH MINERALS) tablet Take 1 tablet by mouth daily.    . naproxen sodium (ANAPROX) 220 MG tablet Take 440 mg by mouth 2 (two) times daily with a meal.    . Omega 3 1200 MG CAPS Take by mouth.    Letta Pate DELICA LANCETS 33G MISC USE TO TEST BLOOD SUGAR 3 TIMES DAILY. (Patient not taking: Reported on 04/15/2017) 1 each 11  . vitamin B-12 (CYANOCOBALAMIN) 1000 MCG tablet Take 1,000 mcg by mouth daily.    . Vitamin D, Ergocalciferol, (DRISDOL) 50000 UNITS CAPS capsule Take 50,000 Units by mouth every 7 (seven) days.     No facility-administered medications prior to visit.     ROS Review of Systems  Constitutional: Negative for chills and fever.  HENT: Negative for congestion.   Eyes: Negative for visual disturbance.  Respiratory: Negative for cough and shortness of breath.   Cardiovascular: Negative for chest pain.  Gastrointestinal: Negative for abdominal pain and blood in stool.  Musculoskeletal: Positive for arthralgias (knees and right hip ). Negative for back pain.  Skin: Negative for rash.  Allergic/Immunologic: Negative for immunocompromised state.  Hematological: Negative for adenopathy. Does not bruise/bleed easily.  Psychiatric/Behavioral: Negative for dysphoric mood and suicidal ideas.    Objective:  BP (!) 162/98  Pulse 69   Temp 98.4 F (36.9 C) (Oral)   Ht 5\' 2"  (1.575 m)   Wt 285 lb 3.2 oz (129.4 kg)   SpO2 99%   BMI 52.16 kg/m   BP/Weight 07/15/2017 06/17/2017 05/20/2017  Systolic BP 162 152 138  Diastolic BP 98 97 87  Wt. (Lbs) 285.2 285.2 286.4  BMI 52.16 52.16 52.38    Physical Exam  Constitutional: She is oriented to person, place, and time. She appears well-developed and well-nourished. No distress.  HENT:  Head: Normocephalic and atraumatic.  Right Ear: Tympanic membrane, external ear and ear canal normal.  Left Ear: Tympanic membrane, external ear and  ear canal normal.  Mouth/Throat: Oropharynx is clear and moist and mucous membranes are normal.  Cardiovascular: Normal rate, regular rhythm, normal heart sounds and intact distal pulses.   Pulmonary/Chest: Effort normal and breath sounds normal.  Musculoskeletal: She exhibits no edema.  Neurological: She is alert and oriented to person, place, and time.  Skin: Skin is warm and dry. No rash noted.  Psychiatric: She has a normal mood and affect.    Lab Results  Component Value Date   HGBA1C 6.1 04/15/2017   Lab Results  Component Value Date   HGBA1C 5.8 07/15/2017    CBG 98 Assessment & Plan:  [place] was seen today for hypertension and diabetes.  Diagnoses and all orders for this visit:  Diabetes mellitus without complication (HCC) -     POCT glucose (manual entry) -     POCT glycosylated hemoglobin (Hb A1C) -     metFORMIN (GLUCOPHAGE-XR) 500 MG 24 hr tablet; Take 1 tablet (500 mg total) by mouth daily with breakfast.  Hypertension due to endocrine disorder -     hydrALAZINE (APRESOLINE) 25 MG tablet; Take 1 tablet (25 mg total) by mouth 3 (three) times daily.   There are no diagnoses linked to this encounter.  No orders of the defined types were placed in this encounter.   Follow-up: Return in about 6 weeks (around 08/26/2017) for HTN and flu shot .   Dessa PhiJosalyn Charika Mikelson MD

## 2017-07-15 NOTE — Assessment & Plan Note (Signed)
Patient to f/u with orthopaedics

## 2017-07-15 NOTE — Assessment & Plan Note (Signed)
A; HTN in obese female with diabetes We discussed BP goal of < 120-130/80 She is compliant with current regimen. She declines change in hydralazine dose   P: Low salt diet Low carb diet Weight loss Continue hyzaar 100-25 mg daily Continue hydralazine 25 mg three times daily

## 2017-07-18 ENCOUNTER — Ambulatory Visit: Payer: BLUE CROSS/BLUE SHIELD | Admitting: Family Medicine

## 2017-07-21 ENCOUNTER — Telehealth (INDEPENDENT_AMBULATORY_CARE_PROVIDER_SITE_OTHER): Payer: Self-pay | Admitting: Orthopedic Surgery

## 2017-07-21 NOTE — Telephone Encounter (Signed)
Joe with [facility] called left voicemail message stating patient do not want the medication at this time (Synvisc One)  The number to contact Gabriel RungJoe is [phone]

## 2017-07-23 NOTE — Telephone Encounter (Signed)
Noted  

## 2017-08-05 ENCOUNTER — Telehealth (INDEPENDENT_AMBULATORY_CARE_PROVIDER_SITE_OTHER): Payer: Self-pay | Admitting: Orthopedic Surgery

## 2017-08-05 NOTE — Telephone Encounter (Signed)
Linda Cooper with [facility] called advised have not been able to contact patient. The order for Synvisc One is being canceled. Linda Cooper advised he can restart the order once the patient has been contacted and updated phone number is received. The number to contact Linda Cooper is [phone]

## 2017-08-14 ENCOUNTER — Telehealth: Payer: Self-pay | Admitting: Family Medicine

## 2017-08-14 ENCOUNTER — Telehealth: Payer: Self-pay | Admitting: Internal Medicine

## 2017-08-14 DIAGNOSIS — I152 Hypertension secondary to endocrine disorders: Secondary | ICD-10-CM

## 2017-08-14 MED ORDER — LOSARTAN POTASSIUM-HCTZ 100-25 MG PO TABS: 1.0000 | ORAL_TABLET | Freq: Every day | ORAL | 2 refills | Status: DC

## 2017-08-14 MED FILL — LOSARTAN-HCTZ 100-25 MG TAB: 100-25 | 30 days supply | Qty: 30 | Fill #0

## 2017-08-14 NOTE — Telephone Encounter (Signed)
Requested medication refilled

## 2017-08-14 NOTE — Telephone Encounter (Signed)
Pt called to request a refill for losartan-hydrochlorothiazide (HYZAAR) 100-25 MG tablet  She has an appt with the new pcp on 08/26/17, can you auth at least the med until she see the new pcp, please follow up

## 2017-08-26 ENCOUNTER — Ambulatory Visit: Payer: BLUE CROSS/BLUE SHIELD | Admitting: Internal Medicine

## 2017-09-08 ENCOUNTER — Encounter: Payer: Self-pay | Admitting: Internal Medicine

## 2017-09-08 ENCOUNTER — Ambulatory Visit: Payer: Self-pay | Attending: Internal Medicine | Admitting: Internal Medicine

## 2017-09-08 VITALS — BP 140/88 | HR 77 | Temp 98.5°F | Resp 16 | Wt 279.0 lb

## 2017-09-08 DIAGNOSIS — Z7984 Long term (current) use of oral hypoglycemic drugs: Secondary | ICD-10-CM | POA: Insufficient documentation

## 2017-09-08 DIAGNOSIS — M170 Bilateral primary osteoarthritis of knee: Secondary | ICD-10-CM | POA: Insufficient documentation

## 2017-09-08 DIAGNOSIS — Z6841 Body Mass Index (BMI) 40.0 and over, adult: Secondary | ICD-10-CM | POA: Insufficient documentation

## 2017-09-08 DIAGNOSIS — I10 Essential (primary) hypertension: Secondary | ICD-10-CM

## 2017-09-08 DIAGNOSIS — Z823 Family history of stroke: Secondary | ICD-10-CM | POA: Insufficient documentation

## 2017-09-08 DIAGNOSIS — Z888 Allergy status to other drugs, medicaments and biological substances status: Secondary | ICD-10-CM | POA: Insufficient documentation

## 2017-09-08 DIAGNOSIS — Z8249 Family history of ischemic heart disease and other diseases of the circulatory system: Secondary | ICD-10-CM | POA: Insufficient documentation

## 2017-09-08 DIAGNOSIS — Z881 Allergy status to other antibiotic agents status: Secondary | ICD-10-CM | POA: Insufficient documentation

## 2017-09-08 DIAGNOSIS — E282 Polycystic ovarian syndrome: Secondary | ICD-10-CM | POA: Insufficient documentation

## 2017-09-08 DIAGNOSIS — Z79899 Other long term (current) drug therapy: Secondary | ICD-10-CM | POA: Insufficient documentation

## 2017-09-08 DIAGNOSIS — Z833 Family history of diabetes mellitus: Secondary | ICD-10-CM | POA: Insufficient documentation

## 2017-09-08 DIAGNOSIS — E119 Type 2 diabetes mellitus without complications: Secondary | ICD-10-CM

## 2017-09-08 DIAGNOSIS — Z8349 Family history of other endocrine, nutritional and metabolic diseases: Secondary | ICD-10-CM | POA: Insufficient documentation

## 2017-09-08 LAB — GLUCOSE, POCT (MANUAL RESULT ENTRY): POC GLUCOSE: 110 mg/dL — AB (ref 70–99)

## 2017-09-08 NOTE — Patient Instructions (Signed)
Do not forget to get your flu shot at your church.   Try to get your eye exam at [facility]

## 2017-09-08 NOTE — Progress Notes (Signed)
Patient ID: Linda Cooper, female    DOB: 1961/10/18  MRN: [id number]  CC: re-estsbalish   Subjective: Linda Cooper is a 56 y.o. female who presents for chronic ds management. Her concerns today include:  Hx of HTN, DM, obesity and OA knees 1. HTN:   saw Dr. Armen Pickup last mth and blood pressure was elevated. Hydralazine was increased to 3 times a day however patient has been taking 2 in the mornings and 1 in the evenings just because work schedule does not allow for 3 times a day dosing no device to check BP -Norvasc caused ankle swelling in past, so this was not an option  -limits salt until this past wkend. Ate beans with italian sausage.  2. Obesity: loss 6 lbs since last visit -using a fitness appt which helps her stick with caloric goals of 1870 cal/day but she tries to stay b/w 1400-1500 cal because she knows she will splurge on weekends -she tries to park further away at church and [address]. "It depends on how my knees feel." Plan to get back to gym at the [facility].   3. DM: 104 was 14 day average. Compliant with metformin  HM: will get flu shot at her church later this month, Due for Pneumovax and Tdapt but does not wish to have them today. Will consider on next visit. Due for eye exam and colon cancer screening but no insurance. Plans to enroll for insurance between October and November and the market place Patient Active Problem List   Diagnosis Date Noted  . Morbid obesity (HCC) 11/11/2014  . PCOS (polycystic ovarian syndrome) 11/10/2014  . Degenerative arthritis of knee, bilateral 11/10/2014  . Diabetes mellitus without complication (HCC)   . Hypertension      Current Outpatient Prescriptions on File Prior to Visit  Medication Sig Dispense Refill  . albuterol (PROVENTIL HFA;VENTOLIN HFA) 108 (90 Base) MCG/ACT inhaler Inhale 2 puffs into the lungs every 4 (four) hours as needed for wheezing. 1 Inhaler 0  . glucose blood test strip Use as instructed 100  each 12  . hydrALAZINE (APRESOLINE) 25 MG tablet Take 1 tablet (25 mg total) by mouth 3 (three) times daily. 90 tablet 5  . losartan-hydrochlorothiazide (HYZAAR) 100-25 MG tablet Take 1 tablet by mouth daily. 30 tablet 2  . metFORMIN (GLUCOPHAGE-XR) 500 MG 24 hr tablet Take 1 tablet (500 mg total) by mouth daily with breakfast. 30 tablet 11  . Multiple Vitamins-Minerals (MULTIVITAMIN WITH MINERALS) tablet Take 1 tablet by mouth daily.    . naproxen sodium (ANAPROX) 220 MG tablet Take 440 mg by mouth 2 (two) times daily with a meal.    . Omega 3 1200 MG CAPS Take by mouth.    Linda Cooper DELICA LANCETS 33G MISC USE TO TEST BLOOD SUGAR 3 TIMES DAILY. 1 each 11  . vitamin B-12 (CYANOCOBALAMIN) 1000 MCG tablet Take 1,000 mcg by mouth daily.    . Vitamin D, Ergocalciferol, (DRISDOL) 50000 UNITS CAPS capsule Take 50,000 Units by mouth every 7 (seven) days.     No current facility-administered medications on file prior to visit.     Allergies  Allergen Reactions  . Ace Inhibitors Cough  . Erythromycin Base     Cramping in abdomen is reaction/no rash    Social History   Social History  . Marital status: Single    Spouse name: N/A  . Number of children: 1   . Years of education: college    Occupational  History  . [employer]      [employer], in home care,    Social History Main Topics  . Smoking status: Never Smoker  . Smokeless tobacco: Never Used  . Alcohol use No  . Drug use: No  . Sexual activity: Not on file   Other Topics Concern  . Not on file   Social History Narrative   Lives at home with son.    Son is 28.       She is motivated to lose weight and improve health by the expected death of her father  2016-12-11) and the sudden deaths of her cousin (found dead in car one month after death of his wife 08-Feb-2017)  and his wife (died after childbirth 2017-01-11)    Exercise: minimal        Family History  Problem Relation Age of Onset  . Hypertension Mother   . Diabetes  Mother   . Stroke Mother   . Hypertension Father   . Diabetes Father   . Stroke Father   . Diabetes Brother   . Hypertension Brother   . Gout Brother   . Heart disease Sister   . Cancer Neg Hx     Past Surgical History:  Procedure Laterality Date  . ABDOMINAL HYSTERECTOMY  2006   . HERNIA REPAIR  2006    Umbilical     ROS: Review of Systems Negative except as stated above PHYSICAL EXAM: BP 140/88   Pulse 77   Temp 98.5 F (36.9 C) (Oral)   Resp 16   Wt 279 lb (126.6 kg)   SpO2 98%   BMI 51.03 kg/m   Wt Readings from Last 3 Encounters:  09/08/17 279 lb (126.6 kg)  07/15/17 285 lb 3.2 oz (129.4 kg)  06/17/17 285 lb 3.2 oz (129.4 kg)  BP 140/88 with thigh cuff  Physical Exam General appearance - alert, well appearing, and in no distress Mental status - alert, oriented to person, place, and time, normal mood, behavior, speech, dress, motor activity, and thought processes Neck - supple, no significant adenopathy Chest - clear to auscultation, no wheezes, rales or rhonchi, symmetric air entry Heart - normal rate, regular rhythm, normal S1, S2, no murmurs, rubs, clicks or gallops Extremities - peripheral pulses normal, no pedal edema, no clubbing or cyanosis     Chemistry      Component Value Date/Time   NA 141 04/16/2017 0914   K 3.9 04/16/2017 0914   CL 98 04/16/2017 0914   CO2 24 04/16/2017 0914   BUN 27 (H) 04/16/2017 0914   CREATININE 0.86 04/16/2017 0914   CREATININE 0.77 12/26/2014 1153      Component Value Date/Time   CALCIUM 9.4 04/16/2017 0914   ALKPHOS 83 04/16/2017 0914   AST 14 04/16/2017 0914   ALT 15 04/16/2017 0914   BILITOT 0.6 04/16/2017 0914     Lab Results  Component Value Date   WBC 10.0 10/24/2014   HGB 12.1 10/24/2014   HCT 36.9 10/24/2014   MCV 77.4 (L) 10/24/2014   PLT 310 10/24/2014   Lab Results  Component Value Date   CHOL 129 04/16/2017   HDL 65 04/16/2017   LDLCALC 45 04/16/2017   TRIG 97 04/16/2017   CHOLHDL 2.0  04/16/2017   Lab Results  Component Value Date   HGBA1C 5.8 07/15/2017    ASSESSMENT AND PLAN: 1. Essential hypertension At goal. Continue Cozaar/HCTZ and hydralazine Encouraged to limit salt intake  2. Diabetes mellitus  without complication (HCC) At goal. Continue metformin, healthy eating habits and try to increase physical activity - POCT glucose (manual entry)  3. Morbid obesity (HCC) Commended her on weight loss so far. Patient to keep up the good works with restricting calories. She plans to get back into the [facility] the senior center  Patient was given the opportunity to ask questions.  Patient verbalized understanding of the plan and was able to repeat key elements of the plan.   Orders Placed This Encounter  Procedures  . POCT glucose (manual entry)     Requested Prescriptions    No prescriptions requested or ordered in this encounter    Return in about 4 months (around 01/09/2018).  Jonah Blue, MD, FACP

## 2017-09-11 MED FILL — TRUEplus LANCETS 28G MISC: 30 days supply | Qty: 100 | Fill #1

## 2017-09-11 MED FILL — TRUE METRIX TEST STRIP: 30 days supply | Qty: 100 | Fill #2

## 2017-09-24 MED FILL — LOSARTAN-HCTZ 100-25 MG TAB: 100-25 | 30 days supply | Qty: 30 | Fill #1

## 2017-09-29 MED FILL — hydrALAZINE HCL 25 MG TABS: 25 | 30 days supply | Qty: 90 | Fill #1

## 2017-09-29 MED FILL — METFORMIN HCL ER 500 MG TAB: 500 | 30 days supply | Qty: 30 | Fill #1

## 2017-11-04 MED FILL — LOSARTAN-HCTZ 100-25 MG TAB: 100-25 | 30 days supply | Qty: 30 | Fill #2

## 2017-11-18 MED FILL — hydrALAZINE HCL 25 MG TABS: 25 | 30 days supply | Qty: 90 | Fill #2

## 2017-11-18 MED FILL — METFORMIN HCL ER 500 MG TAB: 500 | 30 days supply | Qty: 30 | Fill #2

## 2017-12-16 ENCOUNTER — Other Ambulatory Visit: Payer: Self-pay | Admitting: Internal Medicine

## 2017-12-16 DIAGNOSIS — I152 Hypertension secondary to endocrine disorders: Secondary | ICD-10-CM

## 2017-12-16 MED FILL — LOSARTAN-HCTZ 100-25 MG TAB: 100-25 | 30 days supply | Qty: 30 | Fill #0

## 2018-01-05 MED FILL — METFORMIN HCL ER 500 MG TAB: 500 | 30 days supply | Qty: 30 | Fill #3

## 2018-01-18 ENCOUNTER — Encounter (HOSPITAL_COMMUNITY): Payer: Self-pay

## 2018-01-18 ENCOUNTER — Ambulatory Visit (HOSPITAL_COMMUNITY)
Admission: EM | Admit: 2018-01-18 | Discharge: 2018-01-18 | Disposition: A | Discharge status: Home / Self Care | Payer: BLUE CROSS/BLUE SHIELD | Attending: Physician Assistant | Admitting: Physician Assistant

## 2018-01-18 DIAGNOSIS — M25561 Pain in right knee: Secondary | ICD-10-CM

## 2018-01-18 DIAGNOSIS — M25562 Pain in left knee: Secondary | ICD-10-CM

## 2018-01-18 DIAGNOSIS — G8929 Other chronic pain: Secondary | ICD-10-CM

## 2018-01-18 MED ORDER — TRAMADOL HCL 50 MG PO TABS: 50.0000 mg | ORAL_TABLET | Freq: Four times a day (QID) | ORAL | 0 refills | Status: DC | PRN

## 2018-01-18 MED ORDER — NAPROXEN 500 MG PO TABS: 500.0000 mg | ORAL_TABLET | Freq: Two times a day (BID) | ORAL | 0 refills | Status: DC

## 2018-01-18 NOTE — Discharge Instructions (Signed)
Take the naproxen 500 mg as prescribed.  If your insurance will pay for this medication is okay to take 2 Aleve in the morning and at night as this is just about the same dose.  Take the tramadol mostly at night but you can also use it during the day as long as it does not make you drowsy.  I would call your orthopod to discuss the progression of your knee pain.  Please take your blood pressure medicine.

## 2018-01-18 NOTE — ED Triage Notes (Signed)
Pt has a hx of knee problems, but since yesterday it has been unbearable. Has used her biofreeze, aleve and knee brace without relief. The right knee is worse she states its bone on bone, laying down and sitting down still makes it hurt. Describes it as a stabbing pain. Both knees hurt but right is worse.

## 2018-01-18 NOTE — ED Provider Notes (Signed)
01/18/2018 5:08 PM   DOB: [birth year] / MRN: [id number]  SUBJECTIVE:  Linda Cooper is a 57 y.o. female presenting for right-sided knee pain.  She has a long history of "bone-on-bone bilateral knee arthritis."States that over the last 2 days the right knee has become exquisitely painful and worse with ambulation.  States the pain is a little bit better today she has taken 1 Aleve this morning and used her knee brace.  She is also tried icing as well as topical medications.  She denies fever.  Her blood pressure is elevated today and she states "I have not taken my blood pressure medicine yet."  She denies chest pain, shortness of breath, leg swelling, headache, acute vision changes.  She is allergic to ace inhibitors and erythromycin base.   She  has a past medical history of Diabetes mellitus without complication (HCC) (Dx 2007), Hypertension (Dx 1990), and Rheumatic fever (childhood ).    She  reports that  has never smoked. she has never used smokeless tobacco. She reports that she does not drink alcohol or use drugs. She  has no sexual activity history on file. The patient  has a past surgical history that includes Abdominal hysterectomy (2006 ) and Hernia repair (2006 ).  Her family history includes Diabetes in her brother, father, and mother; Gout in her brother; Heart disease in her sister; Hypertension in her brother, father, and mother; Stroke in her father and mother.  Review of Systems  Constitutional: Negative for chills, diaphoresis and fever.  Respiratory: Negative for cough, hemoptysis, sputum production, shortness of breath and wheezing.   Cardiovascular: Negative for chest pain, orthopnea and leg swelling.  Gastrointestinal: Negative for nausea.  Musculoskeletal: Positive for joint pain.  Skin: Negative for rash.  Neurological: Negative for dizziness.    OBJECTIVE:  BP (!) 187/65 (BP Location: Right Arm) Comment: Notified Janee, Pt sts she hasn't taken BP medication today   Pulse 63   Temp 97.8 F (36.6 C) (Oral)   Resp 18   SpO2 98%   BP Readings from Last 3 Encounters:  01/18/18 (!) 187/65  09/08/17 140/88  07/15/17 (!) 162/98     Physical Exam  Constitutional: She is active.  Non-toxic appearance.  Cardiovascular: Normal rate.  Pulmonary/Chest: Effort normal. No tachypnea.  Musculoskeletal: She exhibits tenderness (Joint line tenderness about the bilateral knees worse on the right side.). She exhibits no edema or deformity.  She can barely step up onto the exam table secondary to knee pain.  Ligaments intact to challenge.  Knee range of motion guarded secondary to pain.  Neurological: She is alert.  Skin: Skin is warm and dry. She is not diaphoretic. No pallor.   Lab Results  Component Value Date   CREATININE 0.86 04/16/2017   BUN 27 (H) 04/16/2017   NA 141 04/16/2017   K 3.9 04/16/2017   CL 98 04/16/2017   CO2 24 04/16/2017   Lab Results  Component Value Date   HGBA1C 5.8 07/15/2017     No results found for this or any previous visit (from the past 72 hour(s)).  No results found.  ASSESSMENT AND PLAN:  No orders of the defined types were placed in this encounter.    Bilateral chronic knee pain: She was initially requesting an injection into the right knee today however she has only taken Aleve once.  She is willing to take medication first before trying an injection.  I think she should go back and speak with her orthopedist  as she is most likely a candidate for bilateral knee replacement.  Most recent creatinine normal and she denies a history of gastric ulcer.  Acute pain of right knee      The patient is advised to call or return to clinic if she does not see an improvement in symptoms, or to seek the care of the closest emergency department if she worsens with the above plan.   Deliah Boston, MHS, PA-C 01/18/2018 5:08 PM    Ofilia Neas, PA-C 01/18/18 1714

## 2018-01-25 ENCOUNTER — Other Ambulatory Visit: Payer: Self-pay

## 2018-01-25 ENCOUNTER — Ambulatory Visit (HOSPITAL_COMMUNITY)
Admission: EM | Admit: 2018-01-25 | Discharge: 2018-01-25 | Disposition: A | Discharge status: Home / Self Care | Payer: BLUE CROSS/BLUE SHIELD | Attending: Physician Assistant | Admitting: Physician Assistant

## 2018-01-25 ENCOUNTER — Encounter (HOSPITAL_COMMUNITY): Payer: Self-pay | Admitting: Emergency Medicine

## 2018-01-25 DIAGNOSIS — J069 Acute upper respiratory infection, unspecified: Secondary | ICD-10-CM | POA: Diagnosis not present

## 2018-01-25 DIAGNOSIS — B9789 Other viral agents as the cause of diseases classified elsewhere: Secondary | ICD-10-CM | POA: Diagnosis not present

## 2018-01-25 MED ORDER — HYDROCODONE-HOMATROPINE 5-1.5 MG/5ML PO SYRP: 2.5000 mL | ORAL_SOLUTION | Freq: Three times a day (TID) | ORAL | 0 refills | Status: AC

## 2018-01-25 NOTE — ED Provider Notes (Signed)
01/25/2018 1:18 PM   DOB: [birth year] / MRN: [id number]  SUBJECTIVE:  Linda Cooper is a 57 y.o. female presenting for cough.  Was seen here about a week ago and 2 days after being seen developed headache, myalgia, sore throat, cough.  She associates tactile fever.  All of her symptoms aside from the cough have resolved.  She is allergic to ace inhibitors and erythromycin base.   She  has a past medical history of Diabetes mellitus without complication (HCC) (Dx 2007), Hypertension (Dx 1990), and Rheumatic fever (childhood ).    She  reports that  has never smoked. she has never used smokeless tobacco. She reports that she does not drink alcohol or use drugs. She  has no sexual activity history on file. The patient  has a past surgical history that includes Abdominal hysterectomy (2006 ) and Hernia repair (2006 ).  Her family history includes Diabetes in her brother, father, and mother; Gout in her brother; Heart disease in her sister; Hypertension in her brother, father, and mother; Stroke in her father and mother.  Review of Systems  Respiratory: Positive for cough and sputum production. Negative for hemoptysis, shortness of breath and wheezing.   Cardiovascular: Negative for chest pain and leg swelling.  Neurological: Negative for dizziness.    OBJECTIVE:  BP (!) 146/103 Comment: reported BP to Dr Ria Clock and Nurse Cornelius Moras  Pulse 83   Temp 98.3 F (36.8 C) (Oral)   Resp 18   SpO2 99%   BP Readings from Last 3 Encounters:  01/25/18 (!) 146/103  01/18/18 (!) 187/65  09/08/17 140/88     Physical Exam  Constitutional: She is active.  Non-toxic appearance.  HENT:  Right Ear: Hearing, tympanic membrane, external ear and ear canal normal.  Left Ear: Hearing, tympanic membrane, external ear and ear canal normal.  Nose: Nose normal. Right sinus exhibits no maxillary sinus tenderness and no frontal sinus tenderness. Left sinus exhibits no maxillary sinus tenderness and no  frontal sinus tenderness.  Mouth/Throat: Uvula is midline, oropharynx is clear and moist and mucous membranes are normal. Mucous membranes are not dry. No oropharyngeal exudate, posterior oropharyngeal edema or tonsillar abscesses.  Cardiovascular: Normal rate, regular rhythm, S1 normal, S2 normal, normal heart sounds and intact distal pulses. Exam reveals no gallop, no friction rub and no decreased pulses.  No murmur heard. Pulmonary/Chest: Effort normal. No stridor. No tachypnea. No respiratory distress. She has no wheezes. She has no rales.  Abdominal: She exhibits no distension.  Musculoskeletal: She exhibits no edema.  Lymphadenopathy:       Head (right side): No submandibular and no tonsillar adenopathy present.       Head (left side): No submandibular and no tonsillar adenopathy present.    She has no cervical adenopathy.  Neurological: She is alert.  Skin: Skin is warm and dry. She is not diaphoretic. No pallor.    No results found for this or any previous visit (from the past 72 hour(s)).  No results found.  ASSESSMENT AND PLAN:  No orders of the defined types were placed in this encounter.    Viral URI with cough - Patient with reassuring exam.  Per her HPI she was likely had the flu.  Symptoms have been going on too long at this point for Tamiflu to help.  I will treat her symptomatically with some cough syrup.  Pressure at the wrist 128/82.      The patient is advised to call or return  to clinic if she does not see an improvement in symptoms, or to seek the care of the closest emergency department if she worsens with the above plan.   Deliah BostonMichael Dewane Timson, MHS, PA-C 01/25/2018 1:18 PM   Ofilia Neaslark, Gumaro Brightbill L, PA-C 01/25/18 1319

## 2018-01-25 NOTE — ED Triage Notes (Signed)
Pt c/o coughing since Wednesday, with sneezing, sore throat, cold symptoms.

## 2018-01-25 NOTE — Discharge Instructions (Signed)
Your heart and lungs sound great today.  I would consider getting some Mucinex to help thin the mucus along with drinking lots of water.  The cough syrup should help tremendously.  It is okay to continue the naproxen for knee pain.

## 2018-01-30 ENCOUNTER — Ambulatory Visit: Payer: BLUE CROSS/BLUE SHIELD | Attending: Internal Medicine | Admitting: Internal Medicine

## 2018-01-30 ENCOUNTER — Encounter: Payer: Self-pay | Admitting: Internal Medicine

## 2018-01-30 VITALS — BP 142/86 | HR 89 | Temp 98.3°F | Resp 16 | Ht 62.5 in | Wt 280.8 lb

## 2018-01-30 DIAGNOSIS — R05 Cough: Secondary | ICD-10-CM | POA: Diagnosis not present

## 2018-01-30 DIAGNOSIS — E282 Polycystic ovarian syndrome: Secondary | ICD-10-CM | POA: Diagnosis not present

## 2018-01-30 DIAGNOSIS — I10 Essential (primary) hypertension: Secondary | ICD-10-CM | POA: Insufficient documentation

## 2018-01-30 DIAGNOSIS — Z7984 Long term (current) use of oral hypoglycemic drugs: Secondary | ICD-10-CM | POA: Insufficient documentation

## 2018-01-30 DIAGNOSIS — Z8249 Family history of ischemic heart disease and other diseases of the circulatory system: Secondary | ICD-10-CM | POA: Diagnosis not present

## 2018-01-30 DIAGNOSIS — Z888 Allergy status to other drugs, medicaments and biological substances status: Secondary | ICD-10-CM | POA: Diagnosis not present

## 2018-01-30 DIAGNOSIS — K529 Noninfective gastroenteritis and colitis, unspecified: Secondary | ICD-10-CM | POA: Diagnosis not present

## 2018-01-30 DIAGNOSIS — Z79891 Long term (current) use of opiate analgesic: Secondary | ICD-10-CM | POA: Insufficient documentation

## 2018-01-30 DIAGNOSIS — Z833 Family history of diabetes mellitus: Secondary | ICD-10-CM | POA: Diagnosis not present

## 2018-01-30 DIAGNOSIS — Z90710 Acquired absence of both cervix and uterus: Secondary | ICD-10-CM | POA: Insufficient documentation

## 2018-01-30 DIAGNOSIS — E119 Type 2 diabetes mellitus without complications: Secondary | ICD-10-CM | POA: Insufficient documentation

## 2018-01-30 DIAGNOSIS — Z9889 Other specified postprocedural states: Secondary | ICD-10-CM | POA: Insufficient documentation

## 2018-01-30 DIAGNOSIS — J209 Acute bronchitis, unspecified: Secondary | ICD-10-CM | POA: Insufficient documentation

## 2018-01-30 DIAGNOSIS — Z79899 Other long term (current) drug therapy: Secondary | ICD-10-CM | POA: Insufficient documentation

## 2018-01-30 DIAGNOSIS — Z823 Family history of stroke: Secondary | ICD-10-CM | POA: Insufficient documentation

## 2018-01-30 DIAGNOSIS — M170 Bilateral primary osteoarthritis of knee: Secondary | ICD-10-CM | POA: Insufficient documentation

## 2018-01-30 DIAGNOSIS — Z881 Allergy status to other antibiotic agents status: Secondary | ICD-10-CM | POA: Insufficient documentation

## 2018-01-30 MED ORDER — BENZONATATE 100 MG PO CAPS: 100.0000 mg | ORAL_CAPSULE | Freq: Two times a day (BID) | ORAL | 0 refills | Status: DC | PRN

## 2018-01-30 MED ORDER — DOXYCYCLINE HYCLATE 100 MG PO TABS: 100.0000 mg | ORAL_TABLET | Freq: Two times a day (BID) | ORAL | 0 refills | Status: DC

## 2018-01-30 MED FILL — BENZONATATE 100 MG CAPSULE: 100 | 15 days supply | Qty: 30 | Fill #0

## 2018-01-30 MED FILL — DOXYCYCLINE HYC 100 MG TAB: 100 | 7 days supply | Qty: 14 | Fill #0

## 2018-01-30 NOTE — Progress Notes (Signed)
Patient ID: Linda Cooper, female    DOB: 1961/05/03  MRN: [id number]  CC: Cough and URI   Subjective: Linda Cooper is a 57 y.o. female who presents for routine follow-up visit.  For over Her concerns today include:  Hx of HTN, DM, obesity and OA knees  1.  C/o cough, sneezing and muscle aches for over 1 week. Seen at UC last week for the same..  Told it was URI.  Prescribed cough syrup with hydrocodone but was unable to tolerate it.  Cough syrup caused headache.  cough productive of cream colored phlegm.  No fever.  Mild shortness of breath.  She does have albuterol inhaler at home but has not been using it  2.  Had some nausea and abdominal cramps that started this afternoon. Patient had to use the bathroom twice while here and reports 2 soft stools  3.  DM: Compliant with metformin   Patient Active Problem List   Diagnosis Date Noted  . Morbid obesity (HCC) 11/11/2014  . PCOS (polycystic ovarian syndrome) 11/10/2014  . Degenerative arthritis of knee, bilateral 11/10/2014  . Diabetes mellitus without complication (HCC)   . Hypertension      Current Outpatient Medications on File Prior to Visit  Medication Sig Dispense Refill  . albuterol (PROVENTIL HFA;VENTOLIN HFA) 108 (90 Base) MCG/ACT inhaler Inhale 2 puffs into the lungs every 4 (four) hours as needed for wheezing. 1 Inhaler 0  . glucose blood test strip Use as instructed 100 each 12  . hydrALAZINE (APRESOLINE) 25 MG tablet Take 1 tablet (25 mg total) by mouth 3 (three) times daily. 90 tablet 5  . HYDROcodone-homatropine (HYCODAN) 5-1.5 MG/5ML syrup Take 2.5-5 mLs by mouth 3 (three) times daily for 5 days. 90 mL 0  . losartan-hydrochlorothiazide (HYZAAR) 100-25 MG tablet TAKE 1 TABLET BY MOUTH DAILY. 30 tablet 0  . metFORMIN (GLUCOPHAGE-XR) 500 MG 24 hr tablet Take 1 tablet (500 mg total) by mouth daily with breakfast. 30 tablet 11  . Multiple Vitamins-Minerals (MULTIVITAMIN WITH MINERALS) tablet Take 1 tablet by  mouth daily.    . naproxen (NAPROSYN) 500 MG tablet Take 1 tablet (500 mg total) by mouth 2 (two) times daily. 60 tablet 0  . naproxen sodium (ANAPROX) 220 MG tablet Take 440 mg by mouth 2 (two) times daily with a meal.    . Omega 3 1200 MG CAPS Take by mouth.    Letta Pate DELICA LANCETS 33G MISC USE TO TEST BLOOD SUGAR 3 TIMES DAILY. 1 each 11  . traMADol (ULTRAM) 50 MG tablet Take 1 tablet (50 mg total) by mouth every 6 (six) hours as needed for severe pain. 40 tablet 0  . vitamin B-12 (CYANOCOBALAMIN) 1000 MCG tablet Take 1,000 mcg by mouth daily.    . Vitamin D, Ergocalciferol, (DRISDOL) 50000 UNITS CAPS capsule Take 50,000 Units by mouth every 7 (seven) days.     No current facility-administered medications on file prior to visit.     Allergies  Allergen Reactions  . Ace Inhibitors Cough  . Erythromycin Base     Cramping in abdomen is reaction/no rash    Social History   Socioeconomic History  . Marital status: Single    Spouse name: Not on file  . Number of children: 1   . Years of education: college   . Highest education level: Not on file  Social Needs  . Financial resource strain: Not on file  . Food insecurity - worry: Not on file  .  Food insecurity - inability: Not on file  . Transportation needs - medical: Not on file  . Transportation needs - non-medical: Not on file  Occupational History  . Occupation: [employer]     Comment: [employer], in home care,   Tobacco Use  . Smoking status: Never Smoker  . Smokeless tobacco: Never Used  Substance and Sexual Activity  . Alcohol use: No  . Drug use: No  . Sexual activity: Not on file  Other Topics Concern  . Not on file  Social History Narrative   Lives at home with son.    Son is 41.       She is motivated to lose weight and improve health by the expected death of her father  12/20/16) and the sudden deaths of her cousin (found dead in car one month after death of his wife February 17, 2017)  and his wife (died after  childbirth 01/20/2017)    Exercise: minimal     Family History  Problem Relation Age of Onset  . Hypertension Mother   . Diabetes Mother   . Stroke Mother   . Hypertension Father   . Diabetes Father   . Stroke Father   . Diabetes Brother   . Hypertension Brother   . Gout Brother   . Heart disease Sister   . Cancer Neg Hx     Past Surgical History:  Procedure Laterality Date  . ABDOMINAL HYSTERECTOMY  2006   . HERNIA REPAIR  2006    Umbilical     ROS: Review of Systems Negative except as stated above PHYSICAL EXAM: BP (!) 142/86   Pulse 89   Temp 98.3 F (36.8 C) (Oral)   Resp 16   Ht 5' 2.5" (1.588 m)   Wt 280 lb 12.8 oz (127.4 kg)   SpO2 97%   BMI 50.54 kg/m   Physical Exam  General appearance -patient appears acutely ill from her upper respiratory symptoms.  She has repeated coughing spasms in my presence Mental status -she is alert and oriented  mouth - mucous membranes moist, pharynx normal without lesions Neck - supple, no significant adenopathy Chest -scattered expiratory wheezes with coughing.  Good air entry.  Few fine crackles at the left base Heart - normal rate, regular rhythm, normal S1, S2, no murmurs, rubs, clicks or gallops  Results for orders placed or performed in visit on 09/08/17  POCT glucose (manual entry)  Result Value Ref Range   POC Glucose 110 (A) 70 - 99 mg/dl   BS 161/W9U 6.9   ASSESSMENT AND PLAN: 1. Acute bronchitis, unspecified organism - benzonatate (TESSALON) 100 MG capsule; Take 1 capsule (100 mg total) by mouth 2 (two) times daily as needed for cough.  Dispense: 30 capsule; Refill: 0 - doxycycline (VIBRA-TABS) 100 MG tablet; Take 1 tablet (100 mg total) by mouth 2 (two) times daily.  Dispense: 14 tablet; Refill: 0  2. Diabetes mellitus without complication (HCC) At goal based on A1C.  Cont Metformin - POCT glucose (manual entry) - POCT glycosylated hemoglobin (Hb A1C)  3. Essential hypertension -elevated but pt coughing  quite a bit today.  Continue current meds  4. Acute gastroenteritis -BRAT discussed.  Push fluids.  Patient was given the opportunity to ask questions.  Patient verbalized understanding of the plan and was able to repeat key elements of the plan.   Orders Placed This Encounter  Procedures  . POCT glucose (manual entry)  . POCT glycosylated hemoglobin (Hb A1C)  Requested Prescriptions   Signed Prescriptions Disp Refills  . benzonatate (TESSALON) 100 MG capsule 30 capsule 0    Sig: Take 1 capsule (100 mg total) by mouth 2 (two) times daily as needed for cough.  . doxycycline (VIBRA-TABS) 100 MG tablet 14 tablet 0    Sig: Take 1 tablet (100 mg total) by mouth 2 (two) times daily.    Return in about 2 years (around 01/31/2020).  Jonah Blueeborah Johnson, MD, FACP

## 2018-01-30 NOTE — Patient Instructions (Addendum)
Nurse only BP check in 1 month.  Viral Gastroenteritis, Adult Viral gastroenteritis is also known as the stomach flu. This condition is caused by certain germs (viruses). These germs can be passed from person to person very easily (are very contagious). This condition can cause sudden watery poop (diarrhea), fever, and throwing up (vomiting). Having watery poop and throwing up can make you feel weak and cause you to get dehydrated. Dehydration can make you tired and thirsty, make you have a dry mouth, and make it so you pee (urinate) less often. Older adults and people with other diseases or a weak defense system (immune system) are at higher risk for dehydration. It is important to replace the fluids that you lose from having watery poop and throwing up. Follow these instructions at home: Follow instructions from your doctor about how to care for yourself at home. Eating and drinking  Follow these instructions as told by your doctor:  Take an oral rehydration solution (ORS). This is a drink that is sold at pharmacies and stores.  Drink clear fluids in small amounts as you are able, such as: ? Water. ? Ice chips. ? Diluted fruit juice. ? Low-calorie sports drinks.  Eat bland, easy-to-digest foods in small amounts as you are able, such as: ? Bananas. ? Applesauce. ? Rice. ? Low-fat (lean) meats. ? Toast. ? Crackers.  Avoid fluids that have a lot of sugar or caffeine in them.  Avoid alcohol.  Avoid spicy or fatty foods.  General instructions  Drink enough fluid to keep your pee (urine) clear or pale yellow.  Wash your hands often. If you cannot use soap and water, use hand sanitizer.  Make sure that all people in your home wash their hands well and often.  Rest at home while you get better.  Take over-the-counter and prescription medicines only as told by your doctor.  Watch your condition for any changes.  Take a warm bath to help with any burning or pain from having  watery poop.  Keep all follow-up visits as told by your doctor. This is important. Contact a doctor if:  You cannot keep fluids down.  Your symptoms get worse.  You have new symptoms.  You feel light-headed or dizzy.  You have muscle cramps. Get help right away if:  You have chest pain.  You feel very weak or you pass out (faint).  You see blood in your throw-up.  Your throw-up looks like coffee grounds.  You have bloody or black poop (stools) or poop that look like tar.  You have a very bad headache, a stiff neck, or both.  You have a rash.  You have very bad pain, cramping, or bloating in your belly (abdomen).  You have trouble breathing.  You are breathing very quickly.  Your heart is beating very quickly.  Your skin feels cold and clammy.  You feel confused.  You have pain when you pee.  You have signs of dehydration, such as: ? Dark pee, hardly any pee, or no pee. ? Cracked lips. ? Dry mouth. ? Sunken eyes. ? Sleepiness. ? Weakness. This information is not intended to replace advice given to you by your health care provider. Make sure you discuss any questions you have with your health care provider. Document Released: 05/13/2008 Document Revised: 06/14/2016 Document Reviewed: 08/01/2015 Elsevier Interactive Patient Education  2017 [employer]

## 2018-02-02 ENCOUNTER — Ambulatory Visit: Payer: BLUE CROSS/BLUE SHIELD | Attending: Internal Medicine | Admitting: *Deleted

## 2018-02-02 ENCOUNTER — Ambulatory Visit: Payer: BLUE CROSS/BLUE SHIELD | Admitting: Internal Medicine

## 2018-02-02 DIAGNOSIS — Z111 Encounter for screening for respiratory tuberculosis: Secondary | ICD-10-CM | POA: Insufficient documentation

## 2018-02-02 LAB — POCT GLYCOSYLATED HEMOGLOBIN (HGB A1C): Hemoglobin A1C: 6.1

## 2018-02-02 LAB — GLUCOSE, POCT (MANUAL RESULT ENTRY): POC Glucose: 119 mg/dl — AB (ref 70–99)

## 2018-02-02 NOTE — Progress Notes (Signed)
PPD Placement note Linda CleaverVictoria J Cooper, 57 y.o. female is here today for placement of PPD test Reason for PPD test: Employment Pt taken PPD test before: no  Verified in allergy area and with patient that they are not allergic to the products PPD is made of (Phenol or Tween). Yes Is patient taking any oral or IV steroid medication now or have they taken it in the last month? no Has the patient ever received the BCG vaccine?: no Has the patient been in recent contact with anyone known or suspected of having active TB disease?: no      P:  PPD placed on 02/02/2018.  Patient advised to return for reading within 48-72 hours.

## 2018-02-04 LAB — TB SKIN TEST
INDURATION: 0 mm
TB Skin Test: NEGATIVE

## 2018-02-09 ENCOUNTER — Other Ambulatory Visit: Payer: Self-pay | Admitting: Internal Medicine

## 2018-02-09 DIAGNOSIS — I152 Hypertension secondary to endocrine disorders: Secondary | ICD-10-CM

## 2018-02-09 MED FILL — LOSARTAN-HCTZ 100-25 MG TAB: 100-25 | 30 days supply | Qty: 30 | Fill #0

## 2018-03-02 ENCOUNTER — Other Ambulatory Visit: Payer: Self-pay

## 2018-03-02 ENCOUNTER — Encounter (HOSPITAL_COMMUNITY): Payer: Self-pay

## 2018-03-02 ENCOUNTER — Emergency Department (HOSPITAL_COMMUNITY): Payer: BLUE CROSS/BLUE SHIELD

## 2018-03-02 DIAGNOSIS — R509 Fever, unspecified: Secondary | ICD-10-CM | POA: Diagnosis not present

## 2018-03-02 DIAGNOSIS — R112 Nausea with vomiting, unspecified: Secondary | ICD-10-CM | POA: Diagnosis not present

## 2018-03-02 DIAGNOSIS — Z7984 Long term (current) use of oral hypoglycemic drugs: Secondary | ICD-10-CM | POA: Insufficient documentation

## 2018-03-02 DIAGNOSIS — R000 Tachycardia, unspecified: Secondary | ICD-10-CM | POA: Diagnosis not present

## 2018-03-02 DIAGNOSIS — E119 Type 2 diabetes mellitus without complications: Secondary | ICD-10-CM | POA: Diagnosis not present

## 2018-03-02 DIAGNOSIS — E282 Polycystic ovarian syndrome: Secondary | ICD-10-CM | POA: Diagnosis not present

## 2018-03-02 DIAGNOSIS — R1011 Right upper quadrant pain: Secondary | ICD-10-CM | POA: Diagnosis not present

## 2018-03-02 DIAGNOSIS — R079 Chest pain, unspecified: Secondary | ICD-10-CM | POA: Diagnosis not present

## 2018-03-02 DIAGNOSIS — R109 Unspecified abdominal pain: Secondary | ICD-10-CM | POA: Diagnosis not present

## 2018-03-02 DIAGNOSIS — Z79899 Other long term (current) drug therapy: Secondary | ICD-10-CM | POA: Insufficient documentation

## 2018-03-02 DIAGNOSIS — R1013 Epigastric pain: Secondary | ICD-10-CM | POA: Diagnosis not present

## 2018-03-02 DIAGNOSIS — I10 Essential (primary) hypertension: Secondary | ICD-10-CM | POA: Insufficient documentation

## 2018-03-02 LAB — CBC
HCT: 38.3 % (ref 36.0–46.0)
HEMOGLOBIN: 12.5 g/dL (ref 12.0–15.0)
MCH: 25.6 pg — ABNORMAL LOW (ref 26.0–34.0)
MCHC: 32.6 g/dL (ref 30.0–36.0)
MCV: 78.3 fL (ref 78.0–100.0)
PLATELETS: 341 10*3/uL (ref 150–400)
RBC: 4.89 MIL/uL (ref 3.87–5.11)
RDW: 15.5 % (ref 11.5–15.5)
WBC: 9.1 10*3/uL (ref 4.0–10.5)

## 2018-03-02 LAB — BASIC METABOLIC PANEL
Anion gap: 9 (ref 5–15)
BUN: 17 mg/dL (ref 6–20)
CALCIUM: 8.8 mg/dL — AB (ref 8.9–10.3)
CHLORIDE: 105 mmol/L (ref 101–111)
CO2: 24 mmol/L (ref 22–32)
CREATININE: 0.67 mg/dL (ref 0.44–1.00)
GFR calc Af Amer: >60 mL/min (ref >60)
GFR calc non Af Amer: >60 mL/min (ref >60)
GLUCOSE: 101 mg/dL — AB (ref 65–99)
Potassium: 4.2 mmol/L (ref 3.5–5.1)
Sodium: 138 mmol/L (ref 135–145)

## 2018-03-02 LAB — I-STAT TROPONIN, ED: TROPONIN I, POC: 0 ng/mL (ref 0.00–0.08)

## 2018-03-02 NOTE — ED Provider Notes (Signed)
Patient placed in Quick Look pathway, seen and evaluated   Chief Complaint: chest pain  HPI:   Linda Cooper is a 57 y.o. female who presents to the ED for chest pain. Patient reports that when she got up this morning she felt the pain in her chest and was burping a lot. She took Tums and the symptoms seemed to improve a little so she went to work. While at work the symptoms worsened and she felt pressure in her chest, nausea and weak all over. The pain does not increase with movement or deep breath.   ROS: chest pain  Physical Exam:  BP (!) 154/91   Pulse (!) 107   Temp 98.7 F (37.1 C)   Resp 19   Ht 5\' 2"  (1.575 m)   Wt 129.3 kg (285 lb)   SpO2 99%   BMI 52.13 kg/m    Gen:  NAD  Neuro: Awake and Alert  Skin: Warm and dry  Heart: tachycardic  Lungs without wheezing or rales.  Focused Exam:    Initiation of care has begun. The patient has been counseled on the process, plan, and necessity for staying for the completion/evaluation, and the remainder of the medical screening examination    Janne Napoleoneese, Morgen Linebaugh M, NP 03/02/18 1626    Arby BarrettePfeiffer, Marcy, MD 03/14/18 1529

## 2018-03-02 NOTE — ED Triage Notes (Signed)
Pt presents to the ed with complaints of chest pain x 1 day that is getting progressively worse. Endorses a pressure like feeling in her chest with nausea and weakness all over.

## 2018-03-03 ENCOUNTER — Emergency Department (HOSPITAL_COMMUNITY): Payer: BLUE CROSS/BLUE SHIELD

## 2018-03-03 ENCOUNTER — Emergency Department (HOSPITAL_COMMUNITY)
Admission: EM | Admit: 2018-03-03 | Discharge: 2018-03-03 | Disposition: A | Discharge status: Home / Self Care | Payer: BLUE CROSS/BLUE SHIELD | Attending: Emergency Medicine | Admitting: Emergency Medicine

## 2018-03-03 DIAGNOSIS — R1011 Right upper quadrant pain: Secondary | ICD-10-CM | POA: Diagnosis not present

## 2018-03-03 DIAGNOSIS — R109 Unspecified abdominal pain: Secondary | ICD-10-CM

## 2018-03-03 DIAGNOSIS — R1013 Epigastric pain: Secondary | ICD-10-CM

## 2018-03-03 LAB — I-STAT TROPONIN, ED: Troponin i, poc: 0 ng/mL (ref 0.00–0.08)

## 2018-03-03 LAB — HEPATIC FUNCTION PANEL
ALK PHOS: 69 U/L (ref 38–126)
ALT: 13 U/L — ABNORMAL LOW (ref 14–54)
AST: 16 U/L (ref 15–41)
Albumin: 3 g/dL — ABNORMAL LOW (ref 3.5–5.0)
BILIRUBIN DIRECT: 0.2 mg/dL (ref 0.1–0.5)
BILIRUBIN INDIRECT: 1.2 mg/dL — AB (ref 0.3–0.9)
Total Bilirubin: 1.4 mg/dL — ABNORMAL HIGH (ref 0.3–1.2)
Total Protein: 6.3 g/dL — ABNORMAL LOW (ref 6.5–8.1)

## 2018-03-03 LAB — LIPASE, BLOOD: Lipase: 19 U/L (ref 11–51)

## 2018-03-03 MED ORDER — SUCRALFATE 1 G PO TABS: 1.0000 g | ORAL_TABLET | Freq: Three times a day (TID) | ORAL | 0 refills | Status: DC

## 2018-03-03 MED ORDER — GI COCKTAIL ~~LOC~~
30.0000 mL | Freq: Once | ORAL | Status: AC
  Administered 2018-03-03: 30 mL via ORAL
  Filled 2018-03-03: qty 30

## 2018-03-03 MED ORDER — FAMOTIDINE 20 MG PO TABS: 20.0000 mg | ORAL_TABLET | Freq: Two times a day (BID) | ORAL | 0 refills | Status: DC

## 2018-03-03 NOTE — ED Notes (Signed)
RN attempted to obtain blood; Blood to be drawn once patient return from [facility]-Monique,RN

## 2018-03-03 NOTE — Discharge Instructions (Addendum)
Start taking carafate and pepcid as prescribed. Please follow up with primary care doctor in 3 days for recheck. Return if worsening.

## 2018-03-03 NOTE — ED Notes (Signed)
Patient transported to Ultrasound 

## 2018-03-03 NOTE — ED Provider Notes (Signed)
[facility] Provider Note   CSN: [id number] Arrival date & time: 03/02/18  1554     History   Chief Complaint Chief Complaint  Patient presents with  . Chest Pain    HPI Linda CleaverVictoria J Greenstreet is a 57 y.o. female.  HPI Linda CleaverVictoria J Cooper is a 57 y.o. female with history of hypertension, diabetes, presents to emergency department complaining of abdominal pain.  Patient states that she woke up early yesterday morning with severe epigastric and right upper quadrant pain.  She reports associated nausea and one episode of emesis.  She states pain does not radiate.  She states pain subsided slightly after she took some Tums, and came back again later in the afternoon and was more severe which is what brought her to emergency department.  She denies any chest pain.  No shortness of breath.  No recent travel or surgeries.  No exertional symptoms.  Denies any changes in bowels.  She does report history of recently being sick with the flu and still has mild cough, however no fever, chills, sputum production.  Past Medical History:  Diagnosis Date  . Diabetes mellitus without complication (HCC) Dx 2007  . Hypertension Dx 1990  . Rheumatic fever childhood    has intermittent murmur and hand arthritis     Patient Active Problem List   Diagnosis Date Noted  . Morbid obesity (HCC) 11/11/2014  . PCOS (polycystic ovarian syndrome) 11/10/2014  . Degenerative arthritis of knee, bilateral 11/10/2014  . Diabetes mellitus without complication (HCC)   . Hypertension     Past Surgical History:  Procedure Laterality Date  . ABDOMINAL HYSTERECTOMY  2006   . HERNIA REPAIR  2006    Umbilical      OB History   None      Home Medications    Prior to Admission medications   Medication Sig Start Date End Date Taking? Authorizing Provider  albuterol (PROVENTIL HFA;VENTOLIN HFA) 108 (90 Base) MCG/ACT inhaler Inhale 2 puffs into the lungs every 4 (four) hours as  needed for wheezing. 05/01/17   Anders SimmondsMcClung, Angela M, PA-C  benzonatate (TESSALON) 100 MG capsule Take 1 capsule (100 mg total) by mouth 2 (two) times daily as needed for cough. 01/30/18   Marcine MatarJohnson, Deborah B, MD  doxycycline (VIBRA-TABS) 100 MG tablet Take 1 tablet (100 mg total) by mouth 2 (two) times daily. 01/30/18   Marcine MatarJohnson, Deborah B, MD  glucose blood test strip Use as instructed 03/06/17   Dessa PhiFunches, Josalyn, MD  hydrALAZINE (APRESOLINE) 25 MG tablet Take 1 tablet (25 mg total) by mouth 3 (three) times daily. 07/15/17   Funches, Gerilyn NestleJosalyn, MD  losartan-hydrochlorothiazide (HYZAAR) 100-25 MG tablet TAKE 1 TABLET BY MOUTH DAILY. 02/09/18   Marcine MatarJohnson, Deborah B, MD  metFORMIN (GLUCOPHAGE-XR) 500 MG 24 hr tablet Take 1 tablet (500 mg total) by mouth daily with breakfast. 07/15/17   Dessa PhiFunches, Josalyn, MD  Multiple Vitamins-Minerals (MULTIVITAMIN WITH MINERALS) tablet Take 1 tablet by mouth daily.    [provider]  naproxen (NAPROSYN) 500 MG tablet Take 1 tablet (500 mg total) by mouth 2 (two) times daily. 01/18/18   Ofilia Neaslark, Michael L, PA-C  naproxen sodium (ANAPROX) 220 MG tablet Take 440 mg by mouth 2 (two) times daily with a meal.    [provider]  Omega 3 1200 MG CAPS Take by mouth.    [provider]  [facility] DEL[facility] LANCETS 33G MISC USE TO TEST BLOOD SUGAR 3 TIMES DAILY. 03/06/17  Funches, Josalyn, MD  traMADol (ULTRAM) 50 MG tablet Take 1 tablet (50 mg total) by mouth every 6 (six) hours as needed for severe pain. 01/18/18   Ofilia Neas, PA-C  vitamin B-12 (CYANOCOBALAMIN) 1000 MCG tablet Take 1,000 mcg by mouth daily.    [provider]  Vitamin D, Ergocalciferol, (DRISDOL) 50000 UNITS CAPS capsule Take 50,000 Units by mouth every 7 (seven) days.    [provider]    Family History Family History  Problem Relation Age of Onset  . Hypertension Mother   . Diabetes Mother   . Stroke Mother   . Hypertension Father   . Diabetes Father   . Stroke Father     . Diabetes Brother   . Hypertension Brother   . Gout Brother   . Heart disease Sister   . Cancer Neg Hx     Social History Social History   Tobacco Use  . Smoking status: Never Smoker  . Smokeless tobacco: Never Used  Substance Use Topics  . Alcohol use: No  . Drug use: No     Allergies   Ace inhibitors and Erythromycin base   Review of Systems Review of Systems  Constitutional: Positive for fatigue. Negative for chills and fever.  Respiratory: Negative for cough, chest tightness and shortness of breath.   Cardiovascular: Negative for chest pain, palpitations and leg swelling.  Gastrointestinal: Positive for abdominal pain, nausea and vomiting. Negative for diarrhea.  Genitourinary: Negative for dysuria, flank pain, pelvic pain, vaginal bleeding, vaginal discharge and vaginal pain.  Musculoskeletal: Negative for arthralgias, myalgias, neck pain and neck stiffness.  Skin: Negative for rash.  Neurological: Positive for weakness. Negative for dizziness and headaches.  All other systems reviewed and are negative.    Physical Exam Updated Vital Signs BP (!) 120/52   Pulse 82   Temp 98.8 F (37.1 C)   Resp 19   Ht 5\' 2"  (1.575 m)   Wt 129.3 kg (285 lb)   SpO2 100%   BMI 52.13 kg/m   Physical Exam  Constitutional: She appears well-developed and well-nourished. No distress.  HENT:  Head: Normocephalic.  Eyes: Conjunctivae are normal.  Neck: Neck supple.  Cardiovascular: Normal rate, regular rhythm and normal heart sounds.  Pulmonary/Chest: Effort normal and breath sounds normal. No respiratory distress. She has no wheezes. She has no rales.  Abdominal: Soft. Bowel sounds are normal. She exhibits no distension. There is tenderness. There is no rebound.  Epigastric and right upper quadrant tenderness  Musculoskeletal: She exhibits no edema.  Neurological: She is alert.  Skin: Skin is warm and dry.  Psychiatric: She has a normal mood and affect. Her behavior is  normal.  Nursing note and vitals reviewed.    ED Treatments / Results  Labs (all labs ordered are listed, but only abnormal results are displayed) Labs Reviewed  BASIC METABOLIC PANEL - Abnormal; Notable for the following components:      Result Value   Glucose, Bld 101 (*)    Calcium 8.8 (*)    All other components within normal limits  CBC - Abnormal; Notable for the following components:   MCH 25.6 (*)    All other components within normal limits  HEPATIC FUNCTION PANEL  LIPASE, BLOOD  I-STAT TROPONIN, ED  I-STAT TROPONIN, ED    EKG EKG Interpretation  Date/Time:  Monday March 02 2018 16:11:58 EDT Ventricular Rate:  103 PR Interval:  176 QRS Duration: 90 QT Interval:  350 QTC Calculation: 458 R  Axis:   106 Text Interpretation:  Sinus tachycardia Rightward axis Cannot rule out Anterior infarct , age undetermined Abnormal ECG Low voltage QRS When compared with ECG of 10/24/2014, No significant change was found Confirmed by Dione Booze ([id number]) on 03/03/2018 1:37:21 AM   Radiology Dg Chest 2 View  Result Date: 03/02/2018 CLINICAL DATA:  Epigastric pain woke up pt. From sleep this AM. Off and on throughout the day.No SOB.Hx of HTN - on medsHx of diabetesHx of rheumatic fever at age of 86 EXAM: CHEST - 2 VIEW COMPARISON:  05/21/2017 FINDINGS: Cardiac silhouette is normal in size and configuration. No mediastinal or hilar masses. No evidence of adenopathy. Clear lungs.  No pleural effusion or pneumothorax. Skeletal structures are intact. IMPRESSION: No active cardiopulmonary disease. Electronically Signed   By: Amie Portland M.D.   On: 03/02/2018 18:36    Procedures Procedures (including critical care time)  Medications Ordered in ED Medications  gi cocktail (Maalox,Lidocaine,Donnatal) (has no administration in time range)     Initial Impression / Assessment and Plan / ED Course  I have reviewed the triage vital signs and the nursing notes.  Pertinent labs & imaging  results that were available during my care of the patient were reviewed by me and considered in my medical decision making (see chart for details).    Patient seen and examined, initially triaged with chest pain, however after examining patient, it appears that patient's pain is more in the right upper quadrant.  She is definitely tender in the right upper quadrant, and has had associated nausea, vomiting.  No shortness of breath or exertional symptoms.  Initial troponin is 0.  Will get a delta troponin and add LFTs and lipase as well as ultrasound of the right upper quadrant.  Two troponins negative. Negative hepatic function and lipase. [place] nromal as well. CP atypical, doubt acs. VS all within normal, doubt PE. Question gastritis. Symptoms did improve with GI cocktail. Will dc home with pepcid and carafate. Pt is non toxic appearing and stable for dc home.   Vitals:   03/02/18 1615 03/02/18 1616 03/02/18 2245 03/03/18 0138  BP:  (!) 154/91 (!) 137/97 (!) 120/52  Pulse:   89 82  Resp:   16 19  Temp:    98.8 F (37.1 C)  SpO2:   96% 100%  Weight: 129.3 kg (285 lb)     Height: 5\' 2"  (1.575 m)       Final Clinical Impressions(s) / ED Diagnoses   Final diagnoses:  Abdominal pain  Epigastric pain    ED Discharge Orders        Ordered    sucralfate (CARAFATE) 1 g tablet  3 times daily with meals & bedtime     03/03/18 0448    famotidine (PEPCID) 20 MG tablet  2 times daily     03/03/18 0448       Trellis Moment [place], PA-C 03/03/18 0449    Shon Baton, MD 03/03/18 [phone]

## 2018-03-04 MED FILL — hydrALAZINE HCL 25 MG TABS: 25 | 30 days supply | Qty: 90 | Fill #3

## 2018-03-04 MED FILL — FAMOTIDINE 20 MG TABS: 20 | 15 days supply | Qty: 30 | Fill #0

## 2018-03-04 MED FILL — METFORMIN HCL ER 500 MG TAB: 500 | 30 days supply | Qty: 30 | Fill #4

## 2018-03-05 ENCOUNTER — Ambulatory Visit: Payer: BLUE CROSS/BLUE SHIELD | Attending: Internal Medicine | Admitting: Family Medicine

## 2018-03-05 VITALS — BP 139/84 | HR 88 | Temp 97.4°F | Resp 16 | Wt 280.0 lb

## 2018-03-05 DIAGNOSIS — E119 Type 2 diabetes mellitus without complications: Secondary | ICD-10-CM | POA: Diagnosis not present

## 2018-03-05 DIAGNOSIS — Z7984 Long term (current) use of oral hypoglycemic drugs: Secondary | ICD-10-CM | POA: Diagnosis not present

## 2018-03-05 DIAGNOSIS — I10 Essential (primary) hypertension: Secondary | ICD-10-CM | POA: Insufficient documentation

## 2018-03-05 DIAGNOSIS — Z79899 Other long term (current) drug therapy: Secondary | ICD-10-CM | POA: Diagnosis not present

## 2018-03-05 DIAGNOSIS — Z881 Allergy status to other antibiotic agents status: Secondary | ICD-10-CM | POA: Insufficient documentation

## 2018-03-05 DIAGNOSIS — K219 Gastro-esophageal reflux disease without esophagitis: Secondary | ICD-10-CM | POA: Insufficient documentation

## 2018-03-05 DIAGNOSIS — E282 Polycystic ovarian syndrome: Secondary | ICD-10-CM | POA: Diagnosis not present

## 2018-03-05 DIAGNOSIS — R1013 Epigastric pain: Secondary | ICD-10-CM

## 2018-03-05 DIAGNOSIS — R112 Nausea with vomiting, unspecified: Secondary | ICD-10-CM | POA: Diagnosis not present

## 2018-03-05 LAB — GLUCOSE, POCT (MANUAL RESULT ENTRY): POC Glucose: 101 mg/dl — AB (ref 70–99)

## 2018-03-05 NOTE — Patient Instructions (Signed)
I am discontinuing the Carafate  Take the Pepcid 20 mg twice daily as needed for indigestion symptoms  If symptoms worsen follow up here in the office you may warrant a referral to gastroenterology   Gastroesophageal Reflux Disease, Adult Normally, food travels down the esophagus and stays in the stomach to be digested. If a person has gastroesophageal reflux disease (GERD), food and stomach acid move back up into the esophagus. When this happens, the esophagus becomes sore and swollen (inflamed). Over time, GERD can make small holes (ulcers) in the lining of the esophagus. Follow these instructions at home: Diet  Follow a diet as told by your doctor. You may need to avoid foods and drinks such as: ? Coffee and tea (with or without caffeine). ? Drinks that contain alcohol. ? Energy drinks and sports drinks. ? Carbonated drinks or sodas. ? Chocolate and cocoa. ? Peppermint and mint flavorings. ? Garlic and onions. ? Horseradish. ? Spicy and acidic foods, such as peppers, chili powder, curry powder, vinegar, hot sauces, and BBQ sauce. ? Citrus fruit juices and citrus fruits, such as oranges, lemons, and limes. ? Tomato-based foods, such as red sauce, chili, salsa, and pizza with red sauce. ? Fried and fatty foods, such as donuts, french fries, potato chips, and high-fat dressings. ? High-fat meats, such as hot dogs, rib eye steak, sausage, ham, and bacon. ? High-fat dairy items, such as whole milk, butter, and cream cheese.  Eat small meals often. Avoid eating large meals.  Avoid drinking large amounts of liquid with your meals.  Avoid eating meals during the 2-3 hours before bedtime.  Avoid lying down right after you eat.  Do not exercise right after you eat. General instructions  Pay attention to any changes in your symptoms.  Take over-the-counter and prescription medicines only as told by your doctor. Do not take aspirin, ibuprofen, or other NSAIDs unless your doctor says  it is okay.  Do not use any tobacco products, including cigarettes, chewing tobacco, and e-cigarettes. If you need help quitting, ask your doctor.  Wear loose clothes. Do not wear anything tight around your waist.  Raise (elevate) the head of your bed about 6 inches (15 cm).  Try to lower your stress. If you need help doing this, ask your doctor.  If you are overweight, lose an amount of weight that is healthy for you. Ask your doctor about a safe weight loss goal.  Keep all follow-up visits as told by your doctor. This is important. Contact a doctor if:  You have new symptoms.  You lose weight and you do not know why it is happening.  You have trouble swallowing, or it hurts to swallow.  You have wheezing or a cough that keeps happening.  Your symptoms do not get better with treatment.  You have a hoarse voice. Get help right away if:  You have pain in your arms, neck, jaw, teeth, or back.  You feel sweaty, dizzy, or light-headed.  You have chest pain or shortness of breath.  You throw up (vomit) and your throw up looks like blood or coffee grounds.  You pass out (faint).  Your poop (stool) is bloody or black.  You cannot swallow, drink, or eat. This information is not intended to replace advice given to you by your health care provider. Make sure you discuss any questions you have with your health care provider. Document Released: 05/13/2008 Document Revised: 05/02/2016 Document Reviewed: 03/22/2015 Elsevier Interactive Patient Education  [employer]2018 Elsevier Inc.

## 2018-03-05 NOTE — Progress Notes (Signed)
Patient ID: Linda Cooper, female    DOB: [birth year], 59 y.o.   MRN: [id number]  PCP: Marcine Matar, MD  Chief Complaint  Patient presents with  . Hospitalization Follow-up    ED    Subjective:  HPI Linda Cooper is a 57 y.o. female with T2DM , morbid obesity, PCOS, Hypertension, presents for ED follow-up. Linda Cooper presented to [facility] emergency department on 03/03/2018 with a complaint of multiple episodes of epigastric pain which radiated into her chest.  She reports that she experienced associated altered taste, nausea with one episode of vomiting, and intense pressure in the epigastric region which radiated into her chest. She denies experiencing diaphoresis. While at the ED all cardiovascular assessment and GI imaging and lab studies were within normal range. Linda Cooper noted the day prior to this episode she ate [place]. Reports experiencing a similar although less severe episode after eating chinese food. Symptoms improved temporarily with Tums.  She has not attempted relief with any PPI or acid reducer medication. She has not had a recurrence since leaving the ED. She was prescribed Pepcid and has not started medication as of yet. Social History   Socioeconomic History  . Marital status: Single    Spouse name: Not on file  . Number of children: 1   . Years of education: college   . Highest education level: Not on file  Occupational History  . Occupation: [employer]     Comment: [employer], in home care,   Social Needs  . Financial resource strain: Not on file  . Food insecurity:    Worry: Not on file    Inability: Not on file  . Transportation needs:    Medical: Not on file    Non-medical: Not on file  Tobacco Use  . Smoking status: Never Smoker  . Smokeless tobacco: Never Used  Substance and Sexual Activity  . Alcohol use: No  . Drug use: No  . Sexual activity: Not on file  Lifestyle  . Physical activity:    Days per week: Not on file    Minutes per  session: Not on file  . Stress: Not on file  Relationships  . Social connections:    Talks on phone: Not on file    Gets together: Not on file    Attends religious service: Not on file    Active member of club or organization: Not on file    Attends meetings of clubs or organizations: Not on file    Relationship status: Not on file  . Intimate partner violence:    Fear of current or ex partner: Not on file    Emotionally abused: Not on file    Physically abused: Not on file    Forced sexual activity: Not on file  Other Topics Concern  . Not on file  Social History Narrative   Lives at home with son.    Son is 40.       She is motivated to lose weight and improve health by the expected death of her father  12/30/16) and the sudden deaths of her cousin (found dead in car one month after death of his wife 02/27/2017)  and his wife (died after childbirth 2017-01-30)    Exercise: minimal     Family History  Problem Relation Age of Onset  . Hypertension Mother   . Diabetes Mother   . Stroke Mother   . Hypertension Father   . Diabetes Father   .  Stroke Father   . Diabetes Brother   . Hypertension Brother   . Gout Brother   . Heart disease Sister   . Cancer Neg Hx    Review of Systems Pertinent negatives listed in HPI  Patient Active Problem List   Diagnosis Date Noted  . Morbid obesity (HCC) 11/11/2014  . PCOS (polycystic ovarian syndrome) 11/10/2014  . Degenerative arthritis of knee, bilateral 11/10/2014  . Diabetes mellitus without complication (HCC)   . Hypertension     Allergies  Allergen Reactions  . Ace Inhibitors Cough  . Erythromycin Base     Cramping in abdomen is reaction/no rash    Prior to Admission medications   Medication Sig Start Date End Date Taking? Authorizing Provider  albuterol (PROVENTIL HFA;VENTOLIN HFA) 108 (90 Base) MCG/ACT inhaler Inhale 2 puffs into the lungs every 4 (four) hours as needed for wheezing. 05/01/17   Anders Simmonds, PA-C   benzonatate (TESSALON) 100 MG capsule Take 1 capsule (100 mg total) by mouth 2 (two) times daily as needed for cough. Patient not taking: Reported on 03/03/2018 01/30/18   Marcine Matar, MD  doxycycline (VIBRA-TABS) 100 MG tablet Take 1 tablet (100 mg total) by mouth 2 (two) times daily. Patient not taking: Reported on 03/03/2018 01/30/18   Marcine Matar, MD  famotidine (PEPCID) 20 MG tablet Take 1 tablet (20 mg total) by mouth 2 (two) times daily. 03/03/18   Kirichenko, Lemont Fillers, PA-C  glucose blood test strip Use as instructed 03/06/17   Dessa Phi, MD  hydrALAZINE (APRESOLINE) 25 MG tablet Take 1 tablet (25 mg total) by mouth 3 (three) times daily. 07/15/17   Funches, Gerilyn Nestle, MD  losartan-hydrochlorothiazide (HYZAAR) 100-25 MG tablet TAKE 1 TABLET BY MOUTH DAILY. 02/09/18   Marcine Matar, MD  metFORMIN (GLUCOPHAGE-XR) 500 MG 24 hr tablet Take 1 tablet (500 mg total) by mouth daily with breakfast. 07/15/17   Dessa Phi, MD  Multiple Vitamins-Minerals (MULTIVITAMIN WITH MINERALS) tablet Take 1 tablet by mouth daily.    [provider]  naproxen (NAPROSYN) 500 MG tablet Take 1 tablet (500 mg total) by mouth 2 (two) times daily. Patient not taking: Reported on 03/03/2018 01/18/18   Ofilia Neas, PA-C  ONETOUCH DELICA LANCETS 33G MISC USE TO TEST BLOOD SUGAR 3 TIMES DAILY. 03/06/17   Funches, Gerilyn Nestle, MD  sucralfate (CARAFATE) 1 g tablet Take 1 tablet (1 g total) by mouth 4 (four) times daily -  with meals and at bedtime. 03/03/18   Kirichenko, Lemont Fillers, PA-C  traMADol (ULTRAM) 50 MG tablet Take 1 tablet (50 mg total) by mouth every 6 (six) hours as needed for severe pain. Patient not taking: Reported on 03/03/2018 01/18/18   Silvestre Mesi    Past Medical, Surgical Family and Social History reviewed and updated.    Objective:   Today's Vitals   03/05/18 0848  BP: 139/84  Pulse: 88  Resp: 16  Temp: (!) 97.4 F (36.3 C)  TempSrc: Oral  SpO2: 99%  Weight:  280 lb (127 kg)    Wt Readings from Last 3 Encounters:  03/05/18 280 lb (127 kg)  03/02/18 285 lb (129.3 kg)  01/30/18 280 lb 12.8 oz (127.4 kg)    Physical Exam  Constitutional: She appears well-developed and well-nourished.  HENT:  Head: Normocephalic and atraumatic.  Eyes: Pupils are equal, round, and reactive to light. Conjunctivae and EOM are normal.  Neck: Normal range of motion.  Pulmonary/Chest: Effort normal and breath sounds normal.  Abdominal: Soft. Normal appearance and bowel sounds are normal.  Musculoskeletal: Normal range of motion.  Lymphadenopathy:    She has no cervical adenopathy.  Skin: Skin is warm and dry.  Psychiatric: She has a normal mood and affect. Her behavior is normal. Judgment and thought content normal.   Assessment & Plan:  1. Diabetes mellitus without complication (HCC), glucose today stable 101. No changes to current diabetes regimen.  2. Acid Reflux- will trial a course of Famotidine 20 mg twice daily as needed for symptoms. Patient would like to hold off on a daily PPI for now. If symptoms persist, patient may warrant further work -up by Gastroenterology.  3. Abdominal Pain, Epigastric-resolved.   RTC: Keep scheduled follow-up.   Godfrey PickKimberly S. Tiburcio PeaHarris, MSN, FNP-C [facility]  [address] [place], [place] [address] [phone]

## 2018-03-23 MED FILL — LOSARTAN-HCTZ 100-25 MG TAB: 100-25 | 30 days supply | Qty: 30 | Fill #1

## 2018-04-03 ENCOUNTER — Ambulatory Visit: Payer: BLUE CROSS/BLUE SHIELD | Admitting: Internal Medicine

## 2018-04-30 MED FILL — hydrALAZINE HCL 25 MG TABS: 25 | 30 days supply | Qty: 90 | Fill #0

## 2018-04-30 MED FILL — METFORMIN HCL ER 500 MG TAB: 500 | 30 days supply | Qty: 30 | Fill #5

## 2018-05-08 ENCOUNTER — Ambulatory Visit: Payer: BLUE CROSS/BLUE SHIELD | Admitting: Internal Medicine

## 2018-05-15 MED FILL — LOSARTAN-HCTZ 100-25 MG TAB: 100-25 | 30 days supply | Qty: 30 | Fill #2

## 2018-06-12 ENCOUNTER — Ambulatory Visit: Payer: BLUE CROSS/BLUE SHIELD | Admitting: Internal Medicine

## 2018-06-29 ENCOUNTER — Other Ambulatory Visit: Payer: Self-pay | Admitting: Internal Medicine

## 2018-06-29 DIAGNOSIS — I152 Hypertension secondary to endocrine disorders: Secondary | ICD-10-CM

## 2018-06-29 MED FILL — hydrALAZINE HCL 25 MG TABS: 25 | 30 days supply | Qty: 90 | Fill #1

## 2018-06-29 MED FILL — METFORMIN HCL ER 500 MG TAB: 500 | 30 days supply | Qty: 30 | Fill #6

## 2018-06-29 MED FILL — LOSARTAN-HCTZ 100-25 MG TAB: 100-25 | 30 days supply | Qty: 30 | Fill #0

## 2018-08-24 ENCOUNTER — Other Ambulatory Visit: Payer: Self-pay | Admitting: Internal Medicine

## 2018-08-24 ENCOUNTER — Telehealth: Payer: Self-pay | Admitting: Internal Medicine

## 2018-08-24 DIAGNOSIS — E119 Type 2 diabetes mellitus without complications: Secondary | ICD-10-CM

## 2018-08-24 DIAGNOSIS — I152 Hypertension secondary to endocrine disorders: Secondary | ICD-10-CM

## 2018-08-24 NOTE — Telephone Encounter (Signed)
Pt called to request the following medications refilled until her next apt. -hydrALAZINE (APRESOLINE) 25 MG tablet  -losartan-hydrochlorothiazide (HYZAAR) 100-25 MG tablet  -metFORMIN (GLUCOPHAGE-XR) 500 MG 24 hr tablet  Please follow up to -[facility]

## 2018-08-25 MED FILL — hydrALAZINE HCL 25 MG TABS: 25 | 30 days supply | Qty: 90 | Fill #0

## 2018-08-25 MED FILL — METFORMIN HCL ER 500 MG TAB: 500 | 30 days supply | Qty: 30 | Fill #0

## 2018-08-25 NOTE — Telephone Encounter (Signed)
Per protocol I was able to refill her metformin and hydralazine but the Lisinopril had to be sent to the provider, the request was sent via the pharmacy's request through surescripts.

## 2018-08-26 MED FILL — LOSARTAN-HCTZ 100-25 MG TAB: 100-25 | 30 days supply | Qty: 30 | Fill #0

## 2018-09-14 ENCOUNTER — Other Ambulatory Visit: Payer: Self-pay | Admitting: Internal Medicine

## 2018-09-14 DIAGNOSIS — Z1231 Encounter for screening mammogram for malignant neoplasm of breast: Secondary | ICD-10-CM

## 2018-09-24 ENCOUNTER — Ambulatory Visit: Payer: BLUE CROSS/BLUE SHIELD | Attending: Internal Medicine | Admitting: Internal Medicine

## 2018-09-24 ENCOUNTER — Encounter: Payer: Self-pay | Admitting: Internal Medicine

## 2018-09-24 VITALS — BP 163/98 | HR 85 | Temp 97.8°F | Resp 14 | Ht 62.0 in | Wt 295.0 lb

## 2018-09-24 DIAGNOSIS — E119 Type 2 diabetes mellitus without complications: Secondary | ICD-10-CM | POA: Diagnosis not present

## 2018-09-24 DIAGNOSIS — Z23 Encounter for immunization: Secondary | ICD-10-CM

## 2018-09-24 DIAGNOSIS — Z8249 Family history of ischemic heart disease and other diseases of the circulatory system: Secondary | ICD-10-CM | POA: Diagnosis not present

## 2018-09-24 DIAGNOSIS — Z79899 Other long term (current) drug therapy: Secondary | ICD-10-CM | POA: Insufficient documentation

## 2018-09-24 DIAGNOSIS — M170 Bilateral primary osteoarthritis of knee: Secondary | ICD-10-CM | POA: Insufficient documentation

## 2018-09-24 DIAGNOSIS — Z6841 Body Mass Index (BMI) 40.0 and over, adult: Secondary | ICD-10-CM | POA: Diagnosis not present

## 2018-09-24 DIAGNOSIS — Z791 Long term (current) use of non-steroidal anti-inflammatories (NSAID): Secondary | ICD-10-CM | POA: Insufficient documentation

## 2018-09-24 DIAGNOSIS — E282 Polycystic ovarian syndrome: Secondary | ICD-10-CM | POA: Insufficient documentation

## 2018-09-24 DIAGNOSIS — I10 Essential (primary) hypertension: Secondary | ICD-10-CM | POA: Diagnosis not present

## 2018-09-24 DIAGNOSIS — Z7984 Long term (current) use of oral hypoglycemic drugs: Secondary | ICD-10-CM | POA: Diagnosis not present

## 2018-09-24 LAB — POCT GLYCOSYLATED HEMOGLOBIN (HGB A1C): Hemoglobin A1C: 6.2 % — AB (ref 4.0–5.6)

## 2018-09-24 MED ORDER — MELOXICAM 15 MG PO TABS: 15.0000 mg | ORAL_TABLET | Freq: Every day | ORAL | 5 refills | Status: DC

## 2018-09-24 MED ORDER — HYDRALAZINE HCL 50 MG PO TABS: 50.0000 mg | ORAL_TABLET | Freq: Three times a day (TID) | ORAL | 5 refills | Status: DC

## 2018-09-24 MED FILL — MELOXICAM 15 MG TABLET: 15 | 30 days supply | Qty: 30 | Fill #0

## 2018-09-24 MED FILL — hydrALAZINE HCL 50 MG TABS: 50 | 30 days supply | Qty: 90 | Fill #0

## 2018-09-24 NOTE — Progress Notes (Signed)
Patient ID: Linda Cooper, female    DOB: 1961-04-05  MRN: [id number]  CC: Follow-up   Subjective: Linda Cooper is a 57 y.o. female who presents for chronic ds management Her concerns today include:  Hx of HTN, DM, obesity and OA knees  DM: compliant with metformin Checks BS QOD, gives range of 90-120s Does a lot of walking on her job.  She works as an Charity fundraiser.  No exercise outside of work. Too tired at the end of her shifts to exercise.  She tries to do well with eating habits.  Of note she has gained 15 pounds since March of this year  OA knees: Knees have been giving her more problems lately.  Had 4 inj in ecah knee over past 4 yrs.  Saw ortho in past and discussed TKR but she does not want to move forward because "I have to work."  Thinking about going to [employer].  Taking Aleve 2 x a day and applies Biofreeze topically  HTN: She is on losartan/HCTZ and hydralazine 25 mg 3 times a day.  However she has been taking hydralazine 50 mg in the morning and 25 in the evenings otherwise she forgets to take the midday dose.  She limits salt in the foods.  No chest pains or shortness of breath.  No lower extremity edema.  Patient Active Problem List   Diagnosis Date Noted  . Morbid obesity (HCC) 11/11/2014  . PCOS (polycystic ovarian syndrome) 11/10/2014  . Degenerative arthritis of knee, bilateral 11/10/2014  . Diabetes mellitus without complication (HCC)   . Hypertension      Current Outpatient Medications on File Prior to Visit  Medication Sig Dispense Refill  . albuterol (PROVENTIL HFA;VENTOLIN HFA) 108 (90 Base) MCG/ACT inhaler Inhale 2 puffs into the lungs every 4 (four) hours as needed for wheezing. 1 Inhaler 0  . glucose blood test strip Use as instructed 100 each 12  . losartan-hydrochlorothiazide (HYZAAR) 100-25 MG tablet TAKE 1 TABLET BY MOUTH DAILY. 30 tablet 0  . metFORMIN (GLUCOPHAGE-XR) 500 MG 24 hr tablet TAKE 1 TABLET (500 MG TOTAL) BY MOUTH DAILY WITH BREAKFAST.  30 tablet 0  . Multiple Vitamins-Minerals (MULTIVITAMIN WITH MINERALS) tablet Take 1 tablet by mouth daily.    . naproxen (NAPROSYN) 500 MG tablet Take 1 tablet (500 mg total) by mouth 2 (two) times daily. 60 tablet 0  . ONETOUCH DELICA LANCETS 33G MISC USE TO TEST BLOOD SUGAR 3 TIMES DAILY. 1 each 11  . sucralfate (CARAFATE) 1 g tablet Take 1 tablet (1 g total) by mouth 4 (four) times daily -  with meals and at bedtime. (Patient not taking: Reported on 09/24/2018) 30 tablet 0   No current facility-administered medications on file prior to visit.     Allergies  Allergen Reactions  . Ace Inhibitors Cough  . Erythromycin Base     Cramping in abdomen is reaction/no rash    Social History   Socioeconomic History  . Marital status: Single    Spouse name: Not on file  . Number of children: 1   . Years of education: college   . Highest education level: Not on file  Occupational History  . Occupation: [employer]     Comment: [employer], in home care,   Social Needs  . Financial resource strain: Not on file  . Food insecurity:    Worry: Not on file    Inability: Not on file  . Transportation needs:    Medical:  Not on file    Non-medical: Not on file  Tobacco Use  . Smoking status: Never Smoker  . Smokeless tobacco: Never Used  Substance and Sexual Activity  . Alcohol use: No  . Drug use: No  . Sexual activity: Not on file  Lifestyle  . Physical activity:    Days per week: Not on file    Minutes per session: Not on file  . Stress: Not on file  Relationships  . Social connections:    Talks on phone: Not on file    Gets together: Not on file    Attends religious service: Not on file    Active member of club or organization: Not on file    Attends meetings of clubs or organizations: Not on file    Relationship status: Not on file  . Intimate partner violence:    Fear of current or ex partner: Not on file    Emotionally abused: Not on file    Physically abused: Not on  file    Forced sexual activity: Not on file  Other Topics Concern  . Not on file  Social History Narrative   Lives at home with son.    Son is 15.       She is motivated to lose weight and improve health by the expected death of her father  Jan 12, 2017) and the sudden deaths of her cousin (found dead in car one month after death of his wife 03/12/2017)  and his wife (died after childbirth 02/12/17)    Exercise: minimal     Family History  Problem Relation Age of Onset  . Hypertension Mother   . Diabetes Mother   . Stroke Mother   . Hypertension Father   . Diabetes Father   . Stroke Father   . Diabetes Brother   . Hypertension Brother   . Gout Brother   . Heart disease Sister   . Cancer Neg Hx     Past Surgical History:  Procedure Laterality Date  . ABDOMINAL HYSTERECTOMY  2006   . HERNIA REPAIR  2006    Umbilical     ROS: Review of Systems Neg except as above PHYSICAL EXAM: BP (!) 163/98 (BP Location: Right Arm, Patient Position: Sitting, Cuff Size: Large)   Pulse 85   Temp 97.8 F (36.6 C) (Oral)   Resp 14   Ht 5\' 2"  (1.575 m)   Wt 295 lb (133.8 kg)   SpO2 100%   BMI 53.96 kg/m   Wt Readings from Last 3 Encounters:  09/24/18 295 lb (133.8 kg)  03/05/18 280 lb (127 kg)  03/02/18 285 lb (129.3 kg)   Physical Exam BP 155/90 General appearance - alert, well appearing, and in no distress Mental status - normal mood, behavior, speech, dress, motor activity, and thought processes Neck - supple, no significant adenopathy Chest - clear to auscultation, no wheezes, rales or rhonchi, symmetric air entry Heart - normal rate, regular rhythm, normal S1, S2, no murmurs, rubs, clicks or gallops Musculoskeletal -knees: Large body habitus.  Moderate discomfort with passive range of motion.  Positive crepitus on passive range of motion of both knee joints. Extremities - trace LE edema  A1C 6.2/BS 90  ASSESSMENT AND PLAN: 1. Diabetes mellitus without complication (HCC) At goal.   Continue metformin. Continue healthy eating habits. Encourage her to move more as her knees would allow. - POCT glucose (manual entry) - POCT glycosylated hemoglobin (Hb A1C) - Lipid panel - Comprehensive metabolic panel  2. Essential hypertension Not at goal. Increase hydralazine to 50 mg 3 times a day.  Advised patient to try to take it as 3 times a day dosing as much as she can rather than twice a day.  Advised to limit salt in the foods. - hydrALAZINE (APRESOLINE) 50 MG tablet; Take 1 tablet (50 mg total) by mouth 3 (three) times daily.  Dispense: 90 tablet; Refill: 5  3. Morbid obesity (HCC) 4. Primary osteoarthritis of both knees Patient to stop Aleve.  She is willing to try Mobic. Encourage weight loss. She declines referral to orthopedics for steroid injection.  She wants to explore the treatments offered at [facility] - meloxicam (MOBIC) 15 MG tablet; Take 1 tablet (15 mg total) by mouth daily.  Dispense: 30 tablet; Refill: 5  5. Need for influenza vaccination  HM: on f/u visit, we need to offer Tdap, Pneumovax and discuss colon CA screening  Patient was given the opportunity to ask questions.  Patient verbalized understanding of the plan and was able to repeat key elements of the plan.   Orders Placed This Encounter  Procedures  . Lipid panel  . Comprehensive metabolic panel  . POCT glucose (manual entry)  . POCT glycosylated hemoglobin (Hb A1C)     Requested Prescriptions   Signed Prescriptions Disp Refills  . meloxicam (MOBIC) 15 MG tablet 30 tablet 5    Sig: Take 1 tablet (15 mg total) by mouth daily.  . hydrALAZINE (APRESOLINE) 50 MG tablet 90 tablet 5    Sig: Take 1 tablet (50 mg total) by mouth 3 (three) times daily.    Return in about 4 months (around 01/25/2019).  Jonah Blue, MD, FACP

## 2018-09-24 NOTE — Patient Instructions (Signed)
Increase hydralazine to 50 mg 3 times a day.  Continue to monitor your blood pressure.  Goal is 130/80 or lower.  Stop the Aleve.  Start meloxicam 15 mg daily to help decrease the knee pain.  I will try to get in some form of regular exercise activity at least about 3-4 times a week.  Weight loss is important in helping to decrease the symptoms of osteoarthritis of the knees.

## 2018-09-25 ENCOUNTER — Other Ambulatory Visit: Payer: Self-pay

## 2018-09-25 DIAGNOSIS — E119 Type 2 diabetes mellitus without complications: Secondary | ICD-10-CM

## 2018-09-25 LAB — COMPREHENSIVE METABOLIC PANEL
ALT: 16 IU/L (ref 0–32)
AST: 16 IU/L (ref 0–40)
Albumin/Globulin Ratio: 1.3 (ref 1.2–2.2)
Albumin: 4 g/dL (ref 3.5–5.5)
Alkaline Phosphatase: 95 IU/L (ref 39–117)
BUN/Creatinine Ratio: 35 — ABNORMAL HIGH (ref 9–23)
BUN: 25 mg/dL — AB (ref 6–24)
Bilirubin Total: 0.6 mg/dL (ref 0.0–1.2)
CALCIUM: 9.3 mg/dL (ref 8.7–10.2)
CO2: 26 mmol/L (ref 20–29)
CREATININE: 0.71 mg/dL (ref 0.57–1.00)
Chloride: 99 mmol/L (ref 96–106)
GFR, EST AFRICAN AMERICAN: 108 mL/min/{1.73_m2} (ref >59)
GFR, EST NON AFRICAN AMERICAN: 94 mL/min/{1.73_m2} (ref >59)
Globulin, Total: 3.2 g/dL (ref 1.5–4.5)
Glucose: 87 mg/dL (ref 65–99)
Potassium: 3.8 mmol/L (ref 3.5–5.2)
Sodium: 139 mmol/L (ref 134–144)
Total Protein: 7.2 g/dL (ref 6.0–8.5)

## 2018-09-25 LAB — LIPID PANEL
CHOL/HDL RATIO: 2.1 ratio (ref 0.0–4.4)
Cholesterol, Total: 156 mg/dL (ref 100–199)
HDL: 73 mg/dL (ref >39)
LDL CALC: 66 mg/dL (ref 0–99)
Triglycerides: 83 mg/dL (ref 0–149)
VLDL Cholesterol Cal: 17 mg/dL (ref 5–40)

## 2018-09-25 LAB — MICROALBUMIN / CREATININE URINE RATIO

## 2018-09-25 LAB — SPECIMEN STATUS REPORT

## 2018-09-25 MED ORDER — TRUEPLUS LANCETS 28G MISC: 12 refills | Status: DC

## 2018-09-25 MED ORDER — GLUCOSE BLOOD VI STRP: ORAL_STRIP | 12 refills | Status: DC

## 2018-09-28 ENCOUNTER — Other Ambulatory Visit: Payer: Self-pay

## 2018-09-28 DIAGNOSIS — E119 Type 2 diabetes mellitus without complications: Secondary | ICD-10-CM

## 2018-09-28 MED ORDER — GLUCOSE BLOOD VI STRP: ORAL_STRIP | 12 refills | Status: DC

## 2018-09-28 MED ORDER — MICROLET LANCETS MISC: 12 refills | Status: DC

## 2018-09-28 MED ORDER — CONTOUR NEXT MONITOR W/DEVICE KIT: 1.0000 [IU] | PACK | Freq: Every day | 0 refills | Status: DC

## 2018-09-28 MED FILL — CONTOUR NEXT STRIPS: 50 days supply | Qty: 50 | Fill #0

## 2018-09-28 MED FILL — MICROLET LANCETS MISC: 90 days supply | Qty: 100 | Fill #0

## 2018-09-28 MED FILL — CONTOUR NEXT METER: W/DEVICE | 1 days supply | Qty: 1 | Fill #0

## 2018-09-29 LAB — GLUCOSE, POCT (MANUAL RESULT ENTRY): POC GLUCOSE: 90 mg/dL (ref 70–99)

## 2018-10-13 ENCOUNTER — Ambulatory Visit
Admission: RE | Admit: 2018-10-13 | Discharge: 2018-10-13 | Disposition: A | Discharge status: Home / Self Care | Payer: BLUE CROSS/BLUE SHIELD | Source: Ambulatory Visit | Attending: Internal Medicine | Admitting: Internal Medicine

## 2018-10-13 DIAGNOSIS — Z1231 Encounter for screening mammogram for malignant neoplasm of breast: Secondary | ICD-10-CM

## 2018-10-15 ENCOUNTER — Other Ambulatory Visit: Payer: Self-pay

## 2018-10-15 ENCOUNTER — Other Ambulatory Visit: Payer: Self-pay | Admitting: Internal Medicine

## 2018-10-15 DIAGNOSIS — I152 Hypertension secondary to endocrine disorders: Secondary | ICD-10-CM

## 2018-10-15 MED ORDER — LOSARTAN POTASSIUM-HCTZ 100-25 MG PO TABS: 1.0000 | ORAL_TABLET | Freq: Every day | ORAL | 6 refills | Status: DC

## 2018-10-15 MED FILL — LOSARTAN-HCTZ 100-25 MG TAB: 100-25 | 30 days supply | Qty: 30 | Fill #0

## 2018-10-16 ENCOUNTER — Encounter: Payer: Self-pay | Admitting: Family Medicine

## 2018-10-16 ENCOUNTER — Ambulatory Visit (INDEPENDENT_AMBULATORY_CARE_PROVIDER_SITE_OTHER): Payer: BLUE CROSS/BLUE SHIELD | Admitting: Sports Medicine

## 2018-10-16 VITALS — BP 158/82 | Ht 62.0 in | Wt 299.0 lb

## 2018-10-16 DIAGNOSIS — M170 Bilateral primary osteoarthritis of knee: Secondary | ICD-10-CM

## 2018-10-16 DIAGNOSIS — E119 Type 2 diabetes mellitus without complications: Secondary | ICD-10-CM

## 2018-10-16 DIAGNOSIS — Z6841 Body Mass Index (BMI) 40.0 and over, adult: Secondary | ICD-10-CM

## 2018-10-16 MED ORDER — METHYLPREDNISOLONE ACETATE 80 MG/ML IJ SUSP
80.0000 mg | Freq: Once | INTRAMUSCULAR | Status: AC
  Administered 2018-10-16: 80 mg via INTRA_ARTICULAR

## 2018-10-16 NOTE — Assessment & Plan Note (Signed)
-   recommend continuing to trend your blood sugars. If >300, contact PCP for further instruction given b/l knee injections.  - patient aware hyperglycemia is a risk when injecting both knees on the same day however she is insistent on this and did not have problems last year when both were injected

## 2018-10-16 NOTE — Patient Instructions (Signed)
It was great to see you today.  Place an ice pack over your knees later today with the numbing medicine wears off. I will see you back in 6-8 weeks depending upon how you do after the knee injections.  We discussed referral to Dr. Quillian Quince to discuss weight loss strategies to take stress off of your knees. If you are not responding to steroid injections, there may be other things to try like: hyaluronic acid, PRP, or radiofrequency ablation.

## 2018-10-16 NOTE — Assessment & Plan Note (Signed)
-   referral placed to Dr. Kathreen Devoid to assist with weight loss discussion in preparation for TKA in the distant future. Patient agreeable to this.

## 2018-10-16 NOTE — Assessment & Plan Note (Signed)
-  Repeat x-rays ordered as her last ones of her knees were back in 2017 showing severe osteoarthritis -Discussed treatment options for severe osteoarthritis of the knees.  These include: Corticosteroid injections, hyaluronic acid, platelet rich plasma, and radiofrequency ablation.  She understands definitive management is total knee arthroplasty however given her BMI of 54, she is not a candidate for this.  INJECTION: Patient was given informed consent, signed copy in the chart. Appropriate time out was taken. Area prepped and draped in usual sterile fashion. 1 cc of methylprednisolone 80 mg/ml plus  3 cc of 0.5% Marcaine without epinephrine was injected into the R intraarticular space of the R knee using a(n) anteromedial approach. The patient tolerated the procedure well. There were no complications. Post procedure instructions were given.  INJECTION: Patient was given informed consent, signed copy in the chart. Appropriate time out was taken. Area prepped and draped in usual sterile fashion. 1 cc of methylprednisolone 80 mg/ml plus  3 cc of 0.5% Marcaine without epinephrine was injected into the L intraarticular space of the R knee using a(n) anteromedial approach. The patient tolerated the procedure well. There were no complications. Post procedure instructions were given.

## 2018-10-16 NOTE — Progress Notes (Signed)
Linda Cooper - 57 y.o. female MRN [id number]  Date of birth: 30-May-1961   Chief complaint: Bilateral knee pain  SUBJECTIVE:    History of present illness: Linda Cooper is a 57 year old female who presents today with a chief complaint of bilateral knee pain.  She has a diagnosis of primary osteoarthritis of the bilateral knees for which she has had for several years now.  It is rated severe in nature.  In the past, she has received corticosteroid injections which have significantly helped with pain and function.  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  Denies any new injuries.  She states that her right knee is worse than her left.  Pain is rated 8 out of 10 in nature and is primarily located on the anterior and medial aspects of both knees.  It is worse with progressive walking and climbing stairs.  Her right knee does intermittently give out on her however she has not suffered a fall from this.  She has not been able to wear knee braces secondary to her body habitus.  She denies any numbness or tingling.  No low back pain.  No ankle pain or groin pain associated with these problems.  Her last knee injection was over a year ago and she reports pretty significant relief for 6 months.  She would like to repeat these knee injections today.  She has considered going to flex [facility] however she has not gone to this before.  She has seen an orthopedic surgeon who recommended surgery however given her BMI she has been unable to have a total knee arthroplasty.  She is a medication controlled diabetic on metformin with her last hemoglobin A1c 6.2.   Review of systems:  As stated above   Interval past medical history, surgical history, family history, and social history obtained and are unchanged.   Of note, her BMI is 54 and she is a type II diabetic on metformin. Medications reviewed and unchanged.  She  is on metformin and Aleve for pertinent positives.  Allergies reviewed and unchanged.  Of note, patient is intolerant to ACE inhibitors and is allergic to erythromycin.  OBJECTIVE:  Physical exam: Vital signs are reviewed. BP (!) 158/82   Ht 5\' 2"  (1.575 m)   Wt 299 lb (135.6 kg)   BMI 54.69 kg/m   Gen.: Alert, oriented, appears stated age, in no apparent distress HEENT: Moist oral mucosa Respiratory: Normal respirations, able to speak in full sentences Cardiac: Regular rate, distal pulses 2+ Integumentary: No rashes or skin lesions Neurologic:  Sensation is intact to light touch L4-S1, negative straight leg raising bilaterally Gait: Antalgic gait with a limp favoring her right side.  Slow to stand up from a seated position and difficulty bearing complete weight on her right knee. Psych: Normal affect, mood is described as good Musculoskeletal: Inspection of her right knee demonstrates no obvious swelling or deformity.  She has significant tenderness to palpation over her medial and lateral joint lines.  She has mild to moderate patellofemoral crepitus with range of motion.  She is limited range of motion in knee flexion to approximately 80 degrees.  She has full knee extension.  Strength testing is limited secondary to pain.  She has negative anterior posterior drawer.  Negative Lachman test.  Discomfort elicited with McMurray's testing.  She is unable to perform [facility] secondary to feeling of instability.  Negative varus valgus stress of the knee.  She is neurovascularly intact.  Inspection of the left knee demonstrates no obvious swelling or deformity.  She does have tenderness to palpation in her medial and lateral joint lines as well as in the patellofemoral region.  Moderate patellofemoral crepitus with range of motion.  She has full range of motion knee flexion and extension.  Strength testing is 5 out of 5 in knee flexion and extension.  Negative anterior posterior drawer.  Negative  Lachman test.  Positive McMurray's test with pain medially.  She is unable to perform Thessaly's.  Negative varus valgus stress of the knee.  Positive patellar grind.    ASSESSMENT & PLAN: Degenerative arthritis of knee, bilateral -Repeat x-rays ordered as her last ones of her knees were back in 2017 showing severe osteoarthritis -Discussed treatment options for severe osteoarthritis of the knees.  These include: Corticosteroid injections, hyaluronic acid, platelet rich plasma, and radiofrequency ablation.  She understands definitive management is total knee arthroplasty however given her BMI of 54, she is not a candidate for this.  INJECTION: Patient was given informed consent, signed copy in the chart. Appropriate time out was taken. Area prepped and draped in usual sterile fashion. 1 cc of methylprednisolone 80 mg/ml plus  3 cc of 0.5% Marcaine without epinephrine was injected into the intraarticular space of the R knee using a(n) anteromedial approach. The patient tolerated the procedure well. There were no complications. Post procedure instructions were given.  INJECTION: Patient was given informed consent, signed copy in the chart. Appropriate time out was taken. Area prepped and draped in usual sterile fashion. 1 cc of methylprednisolone 80 mg/ml plus  3 cc of 0.5% Marcaine without epinephrine was injected into the intraarticular space of the L knee using a(n) anteromedial approach. The patient tolerated the procedure well. There were no complications. Post procedure instructions were given.  BMI 50.0-59.9, adult [facility]) - referral placed to Dr. Kathreen Devoid to assist with weight loss discussion in preparation for TKA in the distant future. Patient agreeable to this.  Diabetes mellitus without complication - recommend continuing to trend your blood sugars. If >300, contact PCP for further instruction given b/l knee injections.  - patient aware hyperglycemia is a risk when injecting both  knees on the same day however she is insistent on this and did not have problems last year when both were injected   Orders Placed This Encounter  Procedures  . DG Knee AP/LAT W/Sunrise Right    Standing Status:   Future    Standing Expiration Date:   12/17/2019    Order Specific Question:   Reason for Exam (SYMPTOM  OR DIAGNOSIS REQUIRED)    Answer:   bilateral knee pain; standing AP, lateral and sunrise views    Order Specific Question:   Is patient pregnant?    Answer:   No    Order Specific Question:   Preferred imaging location?    Answer:   [facility]    Order Specific Question:   Radiology Contrast Protocol - do NOT remove file path    Answer:   \\[email or web address]  . DG Knee AP/LAT W/Sunrise Left    Standing Status:   Future    Standing Expiration Date:   12/17/2019    Order Specific Question:   Reason for Exam (SYMPTOM  OR DIAGNOSIS REQUIRED)    Answer:   bilateral knee pain; standing AP, lateral and sunrise views    Order Specific Question:   Is  patient pregnant?    Answer:   No    Order Specific Question:   Preferred imaging location?    Answer:   [facility]    Order Specific Question:   Radiology Contrast Protocol - do NOT remove file path    Answer:   \\[email or web address]  . Ambulatory referral to Family Practice    Referral Priority:   Routine    Referral Type:   Consultation    Referral Reason:   Specialty Services Required    Referred to Provider:   Wilder Glade, MD    Requested Specialty:   Family Medicine    Number of Visits Requested:   1    Meds ordered this encounter  Medications  . methylPREDNISolone acetate (DEPO-MEDROL) injection 80 mg  . methylPREDNISolone acetate (DEPO-MEDROL) injection 80 mg      Gustavus Messing, DO Sports Medicine Fellow [facility]

## 2018-10-27 ENCOUNTER — Ambulatory Visit
Admission: RE | Admit: 2018-10-27 | Discharge: 2018-10-27 | Disposition: A | Discharge status: Home / Self Care | Payer: BLUE CROSS/BLUE SHIELD | Source: Ambulatory Visit | Attending: Sports Medicine | Admitting: Sports Medicine

## 2018-10-27 DIAGNOSIS — M1712 Unilateral primary osteoarthritis, left knee: Secondary | ICD-10-CM | POA: Diagnosis not present

## 2018-10-27 DIAGNOSIS — M170 Bilateral primary osteoarthritis of knee: Secondary | ICD-10-CM

## 2018-10-27 DIAGNOSIS — M1711 Unilateral primary osteoarthritis, right knee: Secondary | ICD-10-CM | POA: Diagnosis not present

## 2018-11-04 ENCOUNTER — Other Ambulatory Visit: Payer: Self-pay | Admitting: Internal Medicine

## 2018-11-04 DIAGNOSIS — E119 Type 2 diabetes mellitus without complications: Secondary | ICD-10-CM

## 2018-11-04 MED FILL — METFORMIN HCL ER 500 MG TAB: 500 | 30 days supply | Qty: 30 | Fill #0

## 2018-11-16 MED FILL — LOSARTAN-HCTZ 100-25 MG TAB: 100-25 | 30 days supply | Qty: 30 | Fill #1

## 2018-11-16 MED FILL — hydrALAZINE HCL 50 MG TABS: 50 | 30 days supply | Qty: 90 | Fill #1

## 2018-11-25 ENCOUNTER — Encounter (INDEPENDENT_AMBULATORY_CARE_PROVIDER_SITE_OTHER): Payer: BLUE CROSS/BLUE SHIELD

## 2018-12-14 ENCOUNTER — Ambulatory Visit (INDEPENDENT_AMBULATORY_CARE_PROVIDER_SITE_OTHER): Payer: BLUE CROSS/BLUE SHIELD | Admitting: Family Medicine

## 2018-12-18 ENCOUNTER — Other Ambulatory Visit: Payer: Self-pay | Admitting: Internal Medicine

## 2018-12-18 DIAGNOSIS — E119 Type 2 diabetes mellitus without complications: Secondary | ICD-10-CM

## 2018-12-18 MED FILL — LOSARTAN-HCTZ 100-25 MG TAB: 100-25 | 30 days supply | Qty: 30 | Fill #2

## 2018-12-21 MED FILL — METFORMIN HCL ER 500 MG TAB: 500 | 30 days supply | Qty: 30 | Fill #0

## 2018-12-24 ENCOUNTER — Encounter (INDEPENDENT_AMBULATORY_CARE_PROVIDER_SITE_OTHER): Payer: Self-pay

## 2018-12-27 ENCOUNTER — Emergency Department (HOSPITAL_COMMUNITY): Payer: BLUE CROSS/BLUE SHIELD

## 2018-12-27 ENCOUNTER — Emergency Department (HOSPITAL_COMMUNITY)
Admission: EM | Admit: 2018-12-27 | Discharge: 2018-12-27 | Disposition: A | Discharge status: Home / Self Care | Payer: BLUE CROSS/BLUE SHIELD | Attending: Emergency Medicine | Admitting: Emergency Medicine

## 2018-12-27 ENCOUNTER — Other Ambulatory Visit: Payer: Self-pay

## 2018-12-27 ENCOUNTER — Encounter (HOSPITAL_COMMUNITY): Payer: Self-pay

## 2018-12-27 DIAGNOSIS — I10 Essential (primary) hypertension: Secondary | ICD-10-CM | POA: Diagnosis not present

## 2018-12-27 DIAGNOSIS — W458XXA Other foreign body or object entering through skin, initial encounter: Secondary | ICD-10-CM | POA: Insufficient documentation

## 2018-12-27 DIAGNOSIS — M795 Residual foreign body in soft tissue: Secondary | ICD-10-CM

## 2018-12-27 DIAGNOSIS — Z1833 Retained wood fragments: Secondary | ICD-10-CM | POA: Diagnosis not present

## 2018-12-27 DIAGNOSIS — S91339A Puncture wound without foreign body, unspecified foot, initial encounter: Secondary | ICD-10-CM | POA: Diagnosis not present

## 2018-12-27 DIAGNOSIS — Z79899 Other long term (current) drug therapy: Secondary | ICD-10-CM | POA: Diagnosis not present

## 2018-12-27 DIAGNOSIS — Y939 Activity, unspecified: Secondary | ICD-10-CM | POA: Insufficient documentation

## 2018-12-27 DIAGNOSIS — E119 Type 2 diabetes mellitus without complications: Secondary | ICD-10-CM | POA: Insufficient documentation

## 2018-12-27 DIAGNOSIS — M79671 Pain in right foot: Secondary | ICD-10-CM | POA: Diagnosis not present

## 2018-12-27 DIAGNOSIS — Y999 Unspecified external cause status: Secondary | ICD-10-CM | POA: Diagnosis not present

## 2018-12-27 DIAGNOSIS — Y929 Unspecified place or not applicable: Secondary | ICD-10-CM | POA: Insufficient documentation

## 2018-12-27 DIAGNOSIS — S90851A Superficial foreign body, right foot, initial encounter: Secondary | ICD-10-CM | POA: Insufficient documentation

## 2018-12-27 DIAGNOSIS — Z7984 Long term (current) use of oral hypoglycemic drugs: Secondary | ICD-10-CM | POA: Diagnosis not present

## 2018-12-27 DIAGNOSIS — M7989 Other specified soft tissue disorders: Secondary | ICD-10-CM | POA: Diagnosis not present

## 2018-12-27 MED ORDER — LIDOCAINE HCL 2 % IJ SOLN
10.0000 mL | Freq: Once | INTRAMUSCULAR | Status: AC
  Administered 2018-12-27: 200 mg via INTRADERMAL
  Filled 2018-12-27: qty 20

## 2018-12-27 MED ORDER — CEPHALEXIN 500 MG PO CAPS: 500.0000 mg | ORAL_CAPSULE | Freq: Four times a day (QID) | ORAL | 0 refills | Status: AC

## 2018-12-27 MED ORDER — TETANUS-DIPHTH-ACELL PERTUSSIS 5-2.5-18.5 LF-MCG/0.5 IM SUSP
0.5000 mL | Freq: Once | INTRAMUSCULAR | Status: AC
  Administered 2018-12-27: 0.5 mL via INTRAMUSCULAR
  Filled 2018-12-27: qty 0.5

## 2018-12-27 NOTE — ED Provider Notes (Signed)
[facility] DEPARTMENT Provider Note   CSN: [id number] Arrival date & time: 12/27/18  8309     History   Chief Complaint Chief Complaint  Patient presents with  . foreign body in foot    HPI Linda Cooper is a 58 y.o. female.  HPI   Pt 58 y/o female with a h/o HTN, DM, who presents to the ED today c/o right foot pain after stepping on a toothpick last night. States pain is worse with ambulation, better at rest. Pain constant and started suddenly. She was not wearing shoes when this happened thought she had socks on. Tdap is not UTD.   Pt has not taken her bp meds yet today. No cp, sob, headache, or other neuro complaints.  Past Medical History:  Diagnosis Date  . Diabetes mellitus without complication ([place]) Dx 4076  . Hypertension Dx 1990  . Rheumatic fever childhood    has intermittent murmur and hand arthritis     Patient Active Problem List   Diagnosis Date Noted  . BMI 50.0-59.9, adult ([place]) 10/16/2018  . Morbid obesity ([place]) 11/11/2014  . PCOS (polycystic ovarian syndrome) 11/10/2014  . Degenerative arthritis of knee, bilateral 11/10/2014  . Diabetes mellitus without complication ([place])   . Hypertension     Past Surgical History:  Procedure Laterality Date  . ABDOMINAL HYSTERECTOMY  2006   . HERNIA REPAIR  8088    Umbilical      OB History   No obstetric history on file.      Home Medications    Prior to Admission medications   Medication Sig Start Date End Date Taking? Authorizing Provider  albuterol (PROVENTIL HFA;VENTOLIN HFA) 108 (90 Base) MCG/ACT inhaler Inhale 2 puffs into the lungs every 4 (four) hours as needed for wheezing. 05/01/17   Argentina Donovan, PA-C  Blood Glucose Monitoring Suppl (CONTOUR NEXT MONITOR) w/Device KIT 1 Units by Does not apply route daily. 09/28/18   Ladell Pier, MD  cephALEXin (KEFLEX) 500 MG capsule Take 1 capsule (500 mg total) by mouth 4 (four) times daily for 5 days. 12/27/18  01/01/19  Couture, Cortni S, PA-C  glucose blood (CONTOUR NEXT TEST) test strip Use as instructed once daily 09/28/18   Ladell Pier, MD  hydrALAZINE (APRESOLINE) 50 MG tablet Take 1 tablet (50 mg total) by mouth 3 (three) times daily. 09/24/18   Ladell Pier, MD  losartan-hydrochlorothiazide (HYZAAR) 100-25 MG tablet Take 1 tablet by mouth daily. 10/15/18   Ladell Pier, MD  meloxicam (MOBIC) 15 MG tablet Take 1 tablet (15 mg total) by mouth daily. 09/24/18   Ladell Pier, MD  metFORMIN (GLUCOPHAGE-XR) 500 MG 24 hr tablet TAKE 1 TABLET (500 MG TOTAL) BY MOUTH DAILY WITH BREAKFAST. 12/21/18   Ladell Pier, MD  MICROLET LANCETS MISC Use as directed once daily 09/28/18   Ladell Pier, MD  Multiple Vitamins-Minerals (MULTIVITAMIN WITH MINERALS) tablet Take 1 tablet by mouth daily.    [provider]  naproxen (NAPROSYN) 500 MG tablet Take 1 tablet (500 mg total) by mouth 2 (two) times daily. 01/18/18   Tereasa Coop, PA-C    Family History Family History  Problem Relation Age of Onset  . Hypertension Mother   . Diabetes Mother   . Stroke Mother   . Hypertension Father   . Diabetes Father   . Stroke Father   . Diabetes Brother   . Hypertension Brother   . Gout Brother   .  Heart disease Sister   . Breast cancer Cousin   . Cancer Neg Hx     Social History Social History   Tobacco Use  . Smoking status: Never Smoker  . Smokeless tobacco: Never Used  Substance Use Topics  . Alcohol use: No  . Drug use: No     Allergies   Ace inhibitors and Erythromycin base   Review of Systems Review of Systems  Constitutional: Negative for fever.  Musculoskeletal:       Left foot pain, FB in foot  Skin: Positive for wound.     Physical Exam Updated Vital Signs BP (!) 193/97 (BP Location: Right Wrist) Comment: pt states she has not taken meds yet this morning  Pulse 78   Temp 97.7 F (36.5 C) (Oral)   Resp 18   Ht 5' 4" (1.626 m)   Wt  135 kg   SpO2 100%   BMI 51.09 kg/m   Physical Exam Vitals signs and nursing note reviewed.  Constitutional:      General: She is not in acute distress.    Appearance: She is well-developed.  HENT:     Head: Normocephalic and atraumatic.  Eyes:     Conjunctiva/sclera: Conjunctivae normal.  Neck:     Musculoskeletal: Neck supple.  Cardiovascular:     Rate and Rhythm: Normal rate.  Pulmonary:     Effort: Pulmonary effort is normal.  Musculoskeletal:     Comments: Toothpick noted to the palmar surface of the right foot near the medial heel  Skin:    General: Skin is warm and dry.  Neurological:     Mental Status: She is alert.      ED Treatments / Results  Labs (all labs ordered are listed, but only abnormal results are displayed) Labs Reviewed - No data to display  EKG None  Radiology Dg Foot Complete Right  Result Date: 12/27/2018 CLINICAL DATA:  Pain after stepping on toothpick EXAM: RIGHT FOOT COMPLETE - 3+ VIEW COMPARISON:  None. FINDINGS: Frontal, oblique, and lateral views were obtained. There is soft tissue swelling in the mid to distal foot region. There is no appreciable radiopaque foreign body evident. No soft tissue air is seen. No acute fracture or dislocation is evident. There is flexion of the second, third, fourth, and fifth MTP joints. There is no erosive change or bony destruction. There is osteoarthritic change in the first MTP joint. There are prominent posterior and inferior calcaneal spurs as well as calcification in the distal Achilles tendon region. There is mild spurring in the dorsal midfoot. IMPRESSION: 1. Soft tissue swelling in the mid the distal foot. No soft tissue air. 2.  No radiopaque foreign body is noted on this study. 3. No fracture or dislocation. No erosive change or bony destruction. Areas of osteoarthritic change noted. 4. Prominent calcaneal spurs. Calcification in distal Achilles tendon. Electronically Signed   By: Lowella Grip III  M.D.   On: 12/27/2018 10:56    Procedures .Foreign Body Removal Date/Time: 12/27/2018 11:38 AM Performed by: Rodney Booze, PA-C Authorized by: Rodney Booze, PA-C  Consent: Verbal consent obtained. Consent given by: patient Patient understanding: patient states understanding of the procedure being performed Patient consent: the patient's understanding of the procedure matches consent given Procedure consent: procedure consent matches procedure scheduled Site marked: the operative site was marked Imaging studies: imaging studies available Patient identity confirmed: verbally with patient Time out: Immediately prior to procedure a "time out" was called to verify the  correct patient, procedure, equipment, support staff and site/side marked as required. Intake: foot. Anesthesia: local infiltration  Anesthesia: Local Anesthetic: lidocaine 2% without epinephrine  Sedation: Patient sedated: no  Patient restrained: no 1 objects recovered. Objects recovered: toothpick Post-procedure assessment: foreign body removed Patient tolerance: Patient tolerated the procedure well with no immediate complications   (including critical care time)  Medications Ordered in ED Medications  lidocaine (XYLOCAINE) 2 % (with pres) injection 200 mg (200 mg Intradermal Given 12/27/18 1013)  Tdap (BOOSTRIX) injection 0.5 mL (0.5 mLs Intramuscular Given 12/27/18 1013)     Initial Impression / Assessment and Plan / ED Course  I have reviewed the triage vital signs and the nursing notes.  Pertinent labs & imaging results that were available during my care of the patient were reviewed by me and considered in my medical decision making (see chart for details).     Final Clinical Impressions(s) / ED Diagnoses   Final diagnoses:  Foreign body (FB) in soft tissue   Presents to the ED after stepping on a toothpick last night.  Toothpick is still stuck in her foot near her heel.  Imaging obtained to  rule out bony involvement.  X-ray negative for bony involvement.  Area was numbed and foreign body was removed.  Wound was irrigated with 1 L normal saline and patient will be given Rx for antibiotics for home health prevent infection she is diabetic.  She was instructed to follow-up with her PCP in 1 week and to return here for any signs of infection.  She voices understanding of the plan and reasons to return to the ED.  All questions answered.  ED Discharge Orders         Ordered    cephALEXin (KEFLEX) 500 MG capsule  4 times daily     12/27/18 1138           Couture, Cortni S, PA-C 12/27/18 1141    Lacretia Leigh, MD 12/28/18 1541

## 2018-12-27 NOTE — ED Triage Notes (Signed)
Pt presents for evaluation of foreign body to L foot. Stepped on toothpick this morning.

## 2018-12-27 NOTE — Discharge Instructions (Addendum)
You were given a prescription for antibiotics. Please take the antibiotic prescription fully.  ° °Please follow up with your primary care provider within 5-7 days for re-evaluation of your symptoms.  ° °Please return to the emergency room immediately if you experience any new or worsening symptoms or any symptoms that indicate worsening infection such as fevers, increased redness/swelling/pain, warmth, or drainage from the affected area.  ° ° °

## 2018-12-28 ENCOUNTER — Ambulatory Visit (INDEPENDENT_AMBULATORY_CARE_PROVIDER_SITE_OTHER): Payer: BLUE CROSS/BLUE SHIELD | Admitting: Family Medicine

## 2018-12-29 ENCOUNTER — Encounter (INDEPENDENT_AMBULATORY_CARE_PROVIDER_SITE_OTHER): Payer: Self-pay | Admitting: Family Medicine

## 2018-12-29 ENCOUNTER — Ambulatory Visit (INDEPENDENT_AMBULATORY_CARE_PROVIDER_SITE_OTHER): Payer: BLUE CROSS/BLUE SHIELD | Admitting: Family Medicine

## 2018-12-29 VITALS — BP 176/93 | HR 80 | Temp 97.9°F | Ht 61.0 in | Wt 291.0 lb

## 2018-12-29 DIAGNOSIS — R5383 Other fatigue: Secondary | ICD-10-CM

## 2018-12-29 DIAGNOSIS — I10 Essential (primary) hypertension: Secondary | ICD-10-CM

## 2018-12-29 DIAGNOSIS — Z1331 Encounter for screening for depression: Secondary | ICD-10-CM | POA: Diagnosis not present

## 2018-12-29 DIAGNOSIS — R0602 Shortness of breath: Secondary | ICD-10-CM

## 2018-12-29 DIAGNOSIS — E1165 Type 2 diabetes mellitus with hyperglycemia: Secondary | ICD-10-CM | POA: Diagnosis not present

## 2018-12-29 DIAGNOSIS — Z9189 Other specified personal risk factors, not elsewhere classified: Secondary | ICD-10-CM

## 2018-12-29 DIAGNOSIS — Z6841 Body Mass Index (BMI) 40.0 and over, adult: Secondary | ICD-10-CM

## 2018-12-29 DIAGNOSIS — Z0289 Encounter for other administrative examinations: Secondary | ICD-10-CM

## 2018-12-29 NOTE — Progress Notes (Signed)
Office: [phone]  /  Fax: [phone]   Dear Dr. Lunette Stands,   Thank you for referring Linda Cooper to our clinic. The following note includes my evaluation and treatment recommendations.  HPI:   Chief Complaint: OBESITY    Linda Cooper has been referred by Loa Socks, DO for consultation regarding her obesity and obesity related comorbidities.    Linda Cooper (MR# [id number]) is a 58 y.o. female who presents on 12/29/2018 for obesity evaluation and treatment. Current BMI is Body mass index is 54.98 kg/m.Linda Cooper has been struggling with her weight for many years and has been unsuccessful in either losing weight, maintaining weight loss, or reaching her healthy weight goal.     Linda Cooper feels there is a problem with portion control.     Linda Cooper attended our information session and states she is currently in the action stage of change and ready to dedicate time achieving and maintaining a healthier weight. Linda Cooper is interested in becoming our patient and working on intensive lifestyle modifications including (but not limited to) diet, exercise and weight loss.    Linda Cooper states her family eats meals together she thinks her family will eat healthier with  her her desired weight loss is 91 lbs she has been heavy most of  her life she started gaining weight after the birth of her child at 53 years old her heaviest weight ever was 331 lbs she has significant food cravings issues  she skips meals frequently she is frequently drinking liquids with calories she frequently eats larger portions than normal  she struggles with emotional eating    Fatigue Linda Cooper feels her energy is lower than it should be. This has worsened with weight gain and has not worsened recently. Linda Cooper admits to daytime somnolence and  denies waking up still tired. Patient is at risk for obstructive sleep apnea. Patent has a history of symptoms of daytime fatigue. Patient generally gets 5  hours of sleep per night, and states they generally have generally restful sleep. Snoring is present. Apneic episodes are not present. Epworth Sleepiness Score is 5.  Dyspnea on exertion Linda Cooper notes increasing shortness of breath with exercising and seems to be worsening over time with weight gain. She notes getting out of breath sooner with activity than she used to. This has not gotten worse recently. EKG-Normal sinus rhythm with poor R wave progression. Linda Cooper denies orthopnea.  Hypertension Linda Cooper is a 58 y.o. female with hypertension. Peta's blood pressure is uncontrolled today. She denies chest pain, chest pressure, or headaches. She is on losartan/hydrochlorothiazide and Hydralazine. She is working weight loss to help control her blood pressure with the goal of decreasing her risk of heart attack and stroke.  Diabetes II with Hyperglycemia Linda Cooper has a diagnosis of diabetes type II. Linda Cooper's recent Hgb A1c was 6.2 and microalbum Cr done on 09/24/18. She has been working on intensive lifestyle modifications including diet, exercise, and weight loss to help control her blood glucose levels.  At risk for cardiovascular disease Linda Cooper is at a higher than average risk for cardiovascular disease due to obesity, hypertension, and diabetes II. She currently denies any chest pain.  Depression Screen Linda Cooper's Food and Mood (modified PHQ-9) score was  Depression screen PHQ 2/9 12/29/2018  Decreased Interest 2  Down, Depressed, Hopeless 1  PHQ - 2 Score 3  Altered sleeping 1  Tired, decreased energy 1  Change in appetite 1  Feeling bad or failure about yourself  0  Trouble concentrating 1  Moving slowly or fidgety/restless 1  Suicidal thoughts 0  PHQ-9 Score 8  Some recent data might be hidden    ASSESSMENT AND PLAN:  Other fatigue - Plan: EKG 12-Lead, Vitamin B12, CBC With Differential, Folate, T3, T4, free, TSH, VITAMIN D 25 Hydroxy (Vit-D Deficiency,  Fractures)  Shortness of breath on exertion - Plan: Lipid Panel With LDL/HDL Ratio  Essential hypertension  Type 2 diabetes mellitus with hyperglycemia, without long-term current use of insulin (HCC) - Plan: Comprehensive metabolic panel, Hemoglobin A1c, Insulin, random  Depression screening  At risk for heart disease  Class 3 severe obesity with serious comorbidity and body mass index (BMI) of 50.0 to 59.9 in adult, unspecified obesity type ([facility] [place])  PLAN:  Fatigue Linda Cooper was informed that her fatigue may be related to obesity, depression or many other causes. Labs will be ordered, and in the meanwhile Linda Cooper has agreed to work on diet, exercise and weight loss to help with fatigue. Proper sleep hygiene was discussed including the need for 7-8 hours of quality sleep each night. A sleep study was not ordered based on symptoms and Epworth score.  Dyspnea on exertion Linda Cooper's shortness of breath appears to be obesity related and exercise induced. She has agreed to work on weight loss and gradually increase exercise to treat her exercise induced shortness of breath. If Linda Cooper follows our instructions and loses weight without improvement of her shortness of breath, we will plan to refer to pulmonology. We will monitor this condition regularly. Linda Cooper agrees to this plan.  Hypertension We discussed sodium restriction, working on healthy weight loss, and a regular exercise program as the means to achieve improved blood pressure control. Linda Cooper agreed with this plan and agreed to follow up as directed. We will continue to monitor her blood pressure as well as her progress with the above lifestyle modifications. Linda Cooper agrees to continue her medications as prescribed and will watch for signs of hypotension as she continues her lifestyle modifications. EKG was done, and we will check CMP and FLP today. Linda Cooper agrees to follow up with our clinic in 2 weeks.  Diabetes II with  Hyperglycemia Linda Cooper has been given extensive diabetes education by myself today including ideal fasting and post-prandial blood glucose readings, individual ideal Hgb A1c goals and hypoglycemia prevention. We discussed the importance of good blood sugar control to decrease the likelihood of diabetic complications such as nephropathy, neuropathy, limb loss, blindness, coronary artery disease, and death. We discussed the importance of intensive lifestyle modification including diet, exercise and weight loss as the first line treatment for diabetes. Linda Cooper agrees to continue her diabetes medications and we will check Hgb A1c and insulin today. Linda Cooper agrees to follow up with our clinic in 2 weeks.  Cardiovascular risk counselling Linda Cooper was given extended (15 minutes) coronary artery disease prevention counseling today. She is 58 y.o. female and has risk factors for heart disease including obesity, hypertension, and diabetes II. We discussed intensive lifestyle modifications today with an emphasis on specific weight loss instructions and strategies. Pt was also informed of the importance of increasing exercise and decreasing saturated fats to help prevent heart disease.  Depression Screen Linda Cooper had a mildly positive depression screening. Depression is commonly associated with obesity and often results in emotional eating behaviors. We will monitor this closely and work on CBT to help improve the non-hunger eating patterns. Referral to Psychology may be required if no improvement is seen as she continues in our clinic.  Obesity Linda Cooper  is currently in the action stage of change and her goal is to continue with weight loss efforts. I recommend Linda Cooper begin the structured treatment plan as follows:  She has agreed to follow the Category 2 plan Linda Cooper has been instructed to eventually work up to a goal of 150 minutes of combined cardio and strengthening exercise per week for weight loss and  overall health benefits. We discussed the following Behavioral Modification Strategies today: increasing lean protein intake, increasing vegetables, work on meal planning and easy cooking plans, and planning for success   She was informed of the importance of frequent follow up visits to maximize her success with intensive lifestyle modifications for her multiple health conditions. She was informed we would discuss her lab results at her next visit unless there is a critical issue that needs to be addressed sooner. Linda Cooper agreed to keep her next visit at the agreed upon time to discuss these results.  ALLERGIES: Allergies  Allergen Reactions  . Ace Inhibitors Cough  . Erythromycin Base     Cramping in abdomen is reaction/no rash    MEDICATIONS: Current Outpatient Medications on File Prior to Visit  Medication Sig Dispense Refill  . albuterol (PROVENTIL HFA;VENTOLIN HFA) 108 (90 Base) MCG/ACT inhaler Inhale 2 puffs into the lungs every 4 (four) hours as needed for wheezing. 1 Inhaler 0  . Blood Glucose Monitoring Suppl (CONTOUR NEXT MONITOR) w/Device KIT 1 Units by Does not apply route daily. 1 kit 0  . cephALEXin (KEFLEX) 500 MG capsule Take 1 capsule (500 mg total) by mouth 4 (four) times daily for 5 days. 20 capsule 0  . glucose blood (CONTOUR NEXT TEST) test strip Use as instructed once daily 100 each 12  . hydrALAZINE (APRESOLINE) 50 MG tablet Take 1 tablet (50 mg total) by mouth 3 (three) times daily. 90 tablet 5  . losartan-hydrochlorothiazide (HYZAAR) 100-25 MG tablet Take 1 tablet by mouth daily. 30 tablet 6  . metFORMIN (GLUCOPHAGE-XR) 500 MG 24 hr tablet TAKE 1 TABLET (500 MG TOTAL) BY MOUTH DAILY WITH BREAKFAST. 30 tablet 0  . MICROLET LANCETS MISC Use as directed once daily 100 each 12  . Multiple Vitamins-Minerals (MULTIVITAMIN WITH MINERALS) tablet Take 1 tablet by mouth daily.    . naproxen (NAPROSYN) 500 MG tablet Take 1 tablet (500 mg total) by mouth 2 (two) times daily.  60 tablet 0  . NON FORMULARY SEGA PRO BLADDER HEALTH 100 MG DAILY     No current facility-administered medications on file prior to visit.     PAST MEDICAL HISTORY: Past Medical History:  Diagnosis Date  . 'light-for-dates' infant with signs of fetal malnutrition   . Anemia   . Arthritis   . Asthma   . Diabetes mellitus without complication ([place]) Dx 8676  . GERD (gastroesophageal reflux disease)   . Hypertension Dx 1990  . Knee pain, bilateral   . Obesity   . Rheumatic fever childhood    has intermittent murmur and hand arthritis     PAST SURGICAL HISTORY: Past Surgical History:  Procedure Laterality Date  . ABDOMINAL HYSTERECTOMY  2006   . HERNIA REPAIR  7209    Umbilical     SOCIAL HISTORY: Social History   Tobacco Use  . Smoking status: Never Smoker  . Smokeless tobacco: Never Used  Substance Use Topics  . Alcohol use: No  . Drug use: No    FAMILY HISTORY: Family History  Problem Relation Age of Onset  . Hypertension Mother   .  Diabetes Mother   . Stroke Mother   . Heart disease Mother   . Obesity Mother   . Hypertension Father   . Diabetes Father   . Stroke Father   . Obesity Father   . Heart disease Father   . Hyperlipidemia Father   . Diabetes Brother   . Hypertension Brother   . Gout Brother   . Heart disease Sister   . Breast cancer Cousin   . Cancer Neg Hx     ROS: Review of Systems  Constitutional: Positive for malaise/fatigue. Negative for weight loss.  Eyes:       + Wear glasses or contacts  Respiratory: Positive for shortness of breath.   Cardiovascular: Negative for chest pain and orthopnea.       Negative chest pressure  Musculoskeletal:       + Muscle or joint pain + Muscle stiffness (when first getting up)  Skin:       + Dryness  Neurological: Negative for headaches.  Endo/Heme/Allergies:       Negative hypoglycemia    PHYSICAL EXAM: Blood pressure (!) 176/93, pulse 80, temperature 97.9 F (36.6 C), temperature source  Oral, height 5' 1" (1.549 m), weight 291 lb (132 kg), SpO2 97 %. Body mass index is 54.98 kg/m. Physical Exam Vitals signs reviewed.  Constitutional:      Appearance: Normal appearance. She is obese.  HENT:     Head: Normocephalic and atraumatic.     Nose: Nose normal.  Eyes:     General: No scleral icterus.    Extraocular Movements: Extraocular movements intact.  Neck:     Musculoskeletal: Normal range of motion and neck supple.     Comments: No thyromegaly present Cardiovascular:     Rate and Rhythm: Normal rate and regular rhythm.     Pulses: Normal pulses.     Heart sounds: Murmur (3/6 murmur heard) present.  Pulmonary:     Effort: Pulmonary effort is normal. No respiratory distress.     Breath sounds: Normal breath sounds.  Abdominal:     Palpations: Abdomen is soft.     Tenderness: There is no abdominal tenderness.     Comments: + Obesity  Musculoskeletal: Normal range of motion.     Right lower leg: No edema.     Left lower leg: No edema.  Skin:    General: Skin is warm and dry.  Neurological:     Mental Status: She is alert and oriented to person, place, and time.     Coordination: Coordination normal.  Psychiatric:        Mood and Affect: Mood normal.        Behavior: Behavior normal.     RECENT LABS AND TESTS: BMET    Component Value Date/Time   NA 139 09/24/2018 1637   K 3.8 09/24/2018 1637   CL 99 09/24/2018 1637   CO2 26 09/24/2018 1637   GLUCOSE 87 09/24/2018 1637   GLUCOSE 101 (H) 03/02/2018 1625   BUN 25 (H) 09/24/2018 1637   CREATININE 0.71 09/24/2018 1637   CREATININE 0.77 12/26/2014 1153   CALCIUM 9.3 09/24/2018 1637   GFRNONAA 94 09/24/2018 1637   GFRAA 108 09/24/2018 1637   Lab Results  Component Value Date   HGBA1C 6.2 (A) 09/24/2018   No results found for: INSULIN CBC    Component Value Date/Time   WBC 9.1 03/02/2018 1625   RBC 4.89 03/02/2018 1625   HGB 12.5 03/02/2018 1625   HCT 38.3  03/02/2018 1625   PLT 341 03/02/2018  1625   MCV 78.3 03/02/2018 1625   MCH 25.6 (L) 03/02/2018 1625   MCHC 32.6 03/02/2018 1625   RDW 15.5 03/02/2018 1625   Iron/TIBC/Ferritin/ %Sat No results found for: IRON, TIBC, FERRITIN, IRONPCTSAT Lipid Panel     Component Value Date/Time   CHOL 156 09/24/2018 1637   TRIG 83 09/24/2018 1637   HDL 73 09/24/2018 1637   CHOLHDL 2.1 09/24/2018 1637   CHOLHDL 2.0 11/10/2014 1148   VLDL 21 11/10/2014 1148   LDLCALC 66 09/24/2018 1637   Hepatic Function Panel     Component Value Date/Time   PROT 7.2 09/24/2018 1637   ALBUMIN 4.0 09/24/2018 1637   AST 16 09/24/2018 1637   ALT 16 09/24/2018 1637   ALKPHOS 95 09/24/2018 1637   BILITOT 0.6 09/24/2018 1637   BILIDIR 0.2 03/03/2018 0307   IBILI 1.2 (H) 03/03/2018 0307   No results found for: TSH  ECG  shows NSR with a rate of 83 BPM INDIRECT CALORIMETER done today shows a VO2 of 181 and a REE of 1263.  Her calculated basal metabolic rate is 1308 thus her basal metabolic rate is worse than expected.       OBESITY BEHAVIORAL INTERVENTION VISIT  Today's visit was # 1   Starting weight: 291 lbs Starting date: 12/29/2018 Today's weight : 291 lbs  Today's date: 12/29/2018 Total lbs lost to date: 0    ASK: We discussed the diagnosis of obesity with Linda Cooper today and Linda Cooper agreed to give [place] permission to discuss obesity behavioral modification therapy today.  ASSESS: Teegan has the diagnosis of obesity and her BMI today is 55.01 Linda Cooper is in the action stage of change   ADVISE: Linda Cooper was educated on the multiple health risks of obesity as well as the benefit of weight loss to improve her health. She was advised of the need for long term treatment and the importance of lifestyle modifications to improve her current health and to decrease her risk of future health problems.  AGREE: Multiple dietary modification options and treatment options were discussed and  Linda Cooper agreed to follow the recommendations  documented in the above note.  ARRANGE: Lindzey was educated on the importance of frequent visits to treat obesity as outlined per CMS and USPSTF guidelines and agreed to schedule her next follow up appointment today.  I, Trixie Dredge, am acting as transcriptionist for Ilene Qua, MD  I have reviewed the above documentation for accuracy and completeness, and I agree with the above. - Ilene Qua, MD

## 2018-12-30 LAB — CBC WITH DIFFERENTIAL
Basophils Absolute: 0.1 10*3/uL (ref 0.0–0.2)
Basos: 1 %
EOS (ABSOLUTE): 0.4 10*3/uL (ref 0.0–0.4)
Eos: 5 %
Hematocrit: 36.2 % (ref 34.0–46.6)
Hemoglobin: 11.8 g/dL (ref 11.1–15.9)
Immature Grans (Abs): 0.1 10*3/uL (ref 0.0–0.1)
Immature Granulocytes: 1 %
Lymphocytes Absolute: 3 10*3/uL (ref 0.7–3.1)
Lymphs: 38 %
MCH: 25.5 pg — AB (ref 26.6–33.0)
MCHC: 32.6 g/dL (ref 31.5–35.7)
MCV: 78 fL — ABNORMAL LOW (ref 79–97)
Monocytes Absolute: 0.4 10*3/uL (ref 0.1–0.9)
Monocytes: 5 %
Neutrophils Absolute: 3.9 10*3/uL (ref 1.4–7.0)
Neutrophils: 50 %
RBC: 4.63 x10E6/uL (ref 3.77–5.28)
RDW: 15.7 % — ABNORMAL HIGH (ref 11.7–15.4)
WBC: 7.9 10*3/uL (ref 3.4–10.8)

## 2018-12-30 LAB — VITAMIN D 25 HYDROXY (VIT D DEFICIENCY, FRACTURES): Vit D, 25-Hydroxy: 24.8 ng/mL — ABNORMAL LOW (ref 30.0–100.0)

## 2018-12-30 LAB — COMPREHENSIVE METABOLIC PANEL
ALT: 17 IU/L (ref 0–32)
AST: 17 IU/L (ref 0–40)
Albumin/Globulin Ratio: 1.2 (ref 1.2–2.2)
Albumin: 3.6 g/dL — ABNORMAL LOW (ref 3.8–4.9)
Alkaline Phosphatase: 85 IU/L (ref 39–117)
BILIRUBIN TOTAL: 0.7 mg/dL (ref 0.0–1.2)
BUN/Creatinine Ratio: 25 — ABNORMAL HIGH (ref 9–23)
BUN: 18 mg/dL (ref 6–24)
CO2: 23 mmol/L (ref 20–29)
Calcium: 9.1 mg/dL (ref 8.7–10.2)
Chloride: 100 mmol/L (ref 96–106)
Creatinine, Ser: 0.72 mg/dL (ref 0.57–1.00)
GFR calc Af Amer: 106 mL/min/{1.73_m2} (ref >59)
GFR calc non Af Amer: 92 mL/min/{1.73_m2} (ref >59)
Globulin, Total: 3.1 g/dL (ref 1.5–4.5)
Glucose: 76 mg/dL (ref 65–99)
Potassium: 4.1 mmol/L (ref 3.5–5.2)
Sodium: 141 mmol/L (ref 134–144)
TOTAL PROTEIN: 6.7 g/dL (ref 6.0–8.5)

## 2018-12-30 LAB — INSULIN, RANDOM: INSULIN: 12.2 u[IU]/mL (ref 2.6–24.9)

## 2018-12-30 LAB — LIPID PANEL WITH LDL/HDL RATIO
Cholesterol, Total: 137 mg/dL (ref 100–199)
HDL: 57 mg/dL (ref >39)
LDL Calculated: 64 mg/dL (ref 0–99)
LDl/HDL Ratio: 1.1 ratio (ref 0.0–3.2)
Triglycerides: 82 mg/dL (ref 0–149)
VLDL Cholesterol Cal: 16 mg/dL (ref 5–40)

## 2018-12-30 LAB — HEMOGLOBIN A1C
Est. average glucose Bld gHb Est-mCnc: 128 mg/dL
Hgb A1c MFr Bld: 6.1 % — ABNORMAL HIGH (ref 4.8–5.6)

## 2018-12-30 LAB — TSH: TSH: 0.977 u[IU]/mL (ref 0.450–4.500)

## 2018-12-30 LAB — FOLATE: Folate: 13.8 ng/mL (ref >3.0)

## 2018-12-30 LAB — T3: T3, Total: 111 ng/dL (ref 71–180)

## 2018-12-30 LAB — VITAMIN B12: Vitamin B-12: 482 pg/mL (ref 232–1245)

## 2018-12-30 LAB — T4, FREE: Free T4: 1.59 ng/dL (ref 0.82–1.77)

## 2019-01-12 ENCOUNTER — Encounter (INDEPENDENT_AMBULATORY_CARE_PROVIDER_SITE_OTHER): Payer: Self-pay | Admitting: Family Medicine

## 2019-01-12 ENCOUNTER — Ambulatory Visit (INDEPENDENT_AMBULATORY_CARE_PROVIDER_SITE_OTHER): Payer: BLUE CROSS/BLUE SHIELD | Admitting: Family Medicine

## 2019-01-12 VITALS — BP 134/84 | HR 94 | Temp 97.7°F | Ht 61.0 in | Wt 285.0 lb

## 2019-01-12 DIAGNOSIS — I10 Essential (primary) hypertension: Secondary | ICD-10-CM

## 2019-01-12 DIAGNOSIS — Z6841 Body Mass Index (BMI) 40.0 and over, adult: Secondary | ICD-10-CM

## 2019-01-12 DIAGNOSIS — E1165 Type 2 diabetes mellitus with hyperglycemia: Secondary | ICD-10-CM | POA: Diagnosis not present

## 2019-01-12 DIAGNOSIS — Z9189 Other specified personal risk factors, not elsewhere classified: Secondary | ICD-10-CM | POA: Diagnosis not present

## 2019-01-12 DIAGNOSIS — E559 Vitamin D deficiency, unspecified: Secondary | ICD-10-CM

## 2019-01-12 MED ORDER — VITAMIN D (ERGOCALCIFEROL) 1.25 MG (50000 UNIT) PO CAPS: 50000.0000 [IU] | ORAL_CAPSULE | ORAL | 0 refills | Status: DC

## 2019-01-12 MED ORDER — SEMAGLUTIDE 3 MG PO TABS: 3.0000 mg | ORAL_TABLET | Freq: Every day | ORAL | 0 refills | Status: DC

## 2019-01-13 MED FILL — VIT D2 1.25 MG (50,000 UNIT: 1.25 MG | 28 days supply | Qty: 4 | Fill #0

## 2019-01-13 NOTE — Progress Notes (Signed)
Office: [phone]  /  Fax: [phone]   HPI:   Chief Complaint: Linda Cooper is here to discuss her progress with her obesity treatment plan. She is on the Category 2 plan and is following her eating plan approximately 100 % of the time. She states she is exercising 0 minutes 0 times per week. Linda Cooper left at time of 1st appointment and went to [place] for 1 week secondary to going into cardiac arrest then being intubated. She tried to follow the plan as best as she could even while away.  Her weight is 285 lb (129.3 kg) today and has had a weight loss of 6 pounds over a period of 2 weeks since her last visit. She has lost 6 lbs since starting treatment with [place].  Vitamin D Deficiency Linda Cooper has a diagnosis of vitamin D deficiency. She is not on OTC Vit D. She notes fatigue and denies nausea, vomiting or muscle weakness.  At risk for osteopenia and osteoporosis Linda Cooper is at higher risk of osteopenia and osteoporosis due to vitamin D deficiency.   Diabetes II with Hyperglycemia Linda Cooper has a diagnosis of diabetes type II. She is on metformin and denies GI side effects.  Linda Cooper states fasting BGs range between 81 and 129. Last Hgb A1c was 6.1 and insulin was 12.2. She denies hypoglycemia. She has been working on intensive lifestyle modifications including diet, exercise, and weight loss to help control her blood glucose levels.  Hypertension Linda Cooper is a 58 y.o. female with hypertension. Linda Cooper's blood pressure is controlled today. She denies chest pain, chest pressure, or headaches. She is working on weight loss to help control her blood pressure with the goal of decreasing her risk of heart attack and stroke.   ASSESSMENT AND PLAN:  Vitamin D deficiency - Plan: Vitamin D, Ergocalciferol, (DRISDOL) 1.25 MG (50000 UT) CAPS capsule  Type 2 diabetes mellitus with hyperglycemia, without long-term current use of insulin (HCC) - Plan: Semaglutide (RYBELSUS) 3 MG  TABS  Essential hypertension  At risk for osteoporosis  Class 3 severe obesity with serious comorbidity and body mass index (BMI) of 50.0 to 59.9 in adult, unspecified obesity type ([place])  PLAN:  Vitamin D Deficiency Linda Cooper was informed that low vitamin D levels contributes to fatigue and are associated with obesity, breast, and colon cancer. Linda Cooper agrees to start prescription Vit D @50 ,000 IU every week #4 with no refills. She will follow up for routine testing of vitamin D, at least 2-3 times per year. She was informed of the risk of over-replacement of vitamin D and agrees to not increase her dose unless she discusses this with [place] first. Linda Cooper agrees to follow up with our clinic in 2 weeks.  At risk for osteopenia and osteoporosis Linda Cooper was given extended (30 minutes) osteoporosis prevention counseling today. Linda Cooper is at risk for osteopenia and osteoporsis due to her vitamin D deficiency. She was encouraged to take her vitamin D and follow her higher calcium diet and increase strengthening exercise to help strengthen her bones and decrease her risk of osteopenia and osteoporosis.  Diabetes II with Hyperglycemia Linda Cooper has been given extensive diabetes education by myself today including ideal fasting and post-prandial blood glucose readings, individual ideal Hgb A1c goals and hypoglycemia prevention. We discussed the importance of good blood sugar control to decrease the likelihood of diabetic complications such as nephropathy, neuropathy, limb loss, blindness, coronary artery disease, and death. We discussed the importance of intensive lifestyle modification including diet, exercise and weight  loss as the first line treatment for diabetes. Linda Cooper agrees to start Rybelsus 3 mg PO q AM #30 with no refills. We will retest Hgb A1c and insulin in 3 months. Linda Cooper agrees to follow up with our clinic in 2 weeks.  Hypertension We discussed sodium restriction, working on healthy  weight loss, and a regular exercise program as the means to achieve improved blood pressure control. Linda Cooper agreed with this plan and agreed to follow up as directed. We will continue to monitor her blood pressure as well as her progress with the above lifestyle modifications. Linda Cooper agrees to continue her current medications and will watch for signs of hypotension as she continues her lifestyle modifications. Linda Cooper agrees to follow up with our clinic in 2 weeks.  Obesity Linda Cooper is currently in the action stage of change. As such, her goal is to continue with weight loss efforts She has agreed to follow the Category 2 plan + 100 calories + greek yogurt or 1/2 cup of 2% cottage cheese Linda Cooper has been instructed to work up to a goal of 150 minutes of combined cardio and strengthening exercise per week for weight loss and overall health benefits. We discussed the following Behavioral Modification Strategies today: increasing lean protein intake, increasing vegetables, work on meal planning and easy cooking plans, and planning for success Linda Cooper was given additional lunch options.  Linda Cooper has agreed to follow up with our clinic in 2 weeks. She was informed of the importance of frequent follow up visits to maximize her success with intensive lifestyle modifications for her multiple health conditions.  ALLERGIES: Allergies  Allergen Reactions  . Ace Inhibitors Cough  . Erythromycin Base     Cramping in abdomen is reaction/no rash    MEDICATIONS: Current Outpatient Medications on File Prior to Visit  Medication Sig Dispense Refill  . albuterol (PROVENTIL HFA;VENTOLIN HFA) 108 (90 Base) MCG/ACT inhaler Inhale 2 puffs into the lungs every 4 (four) hours as needed for wheezing. 1 Inhaler 0  . Blood Glucose Monitoring Suppl (CONTOUR NEXT MONITOR) w/Device KIT 1 Units by Does not apply route daily. 1 kit 0  . glucose blood (CONTOUR NEXT TEST) test strip Use as instructed once daily 100  each 12  . hydrALAZINE (APRESOLINE) 50 MG tablet Take 1 tablet (50 mg total) by mouth 3 (three) times daily. 90 tablet 5  . losartan-hydrochlorothiazide (HYZAAR) 100-25 MG tablet Take 1 tablet by mouth daily. 30 tablet 6  . metFORMIN (GLUCOPHAGE-XR) 500 MG 24 hr tablet TAKE 1 TABLET (500 MG TOTAL) BY MOUTH DAILY WITH BREAKFAST. 30 tablet 0  . MICROLET LANCETS MISC Use as directed once daily 100 each 12  . Multiple Vitamins-Minerals (MULTIVITAMIN WITH MINERALS) tablet Take 1 tablet by mouth daily.    . naproxen (NAPROSYN) 500 MG tablet Take 1 tablet (500 mg total) by mouth 2 (two) times daily. 60 tablet 0  . NON FORMULARY SEGA PRO BLADDER HEALTH 100 MG DAILY     No current facility-administered medications on file prior to visit.     PAST MEDICAL HISTORY: Past Medical History:  Diagnosis Date  . 'light-for-dates' infant with signs of fetal malnutrition   . Anemia   . Arthritis   . Asthma   . Diabetes mellitus without complication ([place]) Dx 6226  . GERD (gastroesophageal reflux disease)   . Hypertension Dx 1990  . Knee pain, bilateral   . Obesity   . Rheumatic fever childhood    has intermittent murmur and hand arthritis  PAST SURGICAL HISTORY: Past Surgical History:  Procedure Laterality Date  . ABDOMINAL HYSTERECTOMY  2006   . HERNIA REPAIR  4917    Umbilical     SOCIAL HISTORY: Social History   Tobacco Use  . Smoking status: Never Smoker  . Smokeless tobacco: Never Used  Substance Use Topics  . Alcohol use: No  . Drug use: No    FAMILY HISTORY: Family History  Problem Relation Age of Onset  . Hypertension Mother   . Diabetes Mother   . Stroke Mother   . Heart disease Mother   . Obesity Mother   . Hypertension Father   . Diabetes Father   . Stroke Father   . Obesity Father   . Heart disease Father   . Hyperlipidemia Father   . Diabetes Brother   . Hypertension Brother   . Gout Brother   . Heart disease Sister   . Breast cancer Cousin   . Cancer Neg  Hx     ROS: Review of Systems  Constitutional: Positive for malaise/fatigue and weight loss.  Cardiovascular: Negative for chest pain.       Negative chest pressure  Gastrointestinal: Negative for nausea and vomiting.  Musculoskeletal:       Negative muscle weakness  Neurological: Negative for headaches.  Endo/Heme/Allergies:       Negative hypoglycemia    PHYSICAL EXAM: Blood pressure 134/84, pulse 94, temperature 97.7 F (36.5 C), temperature source Oral, height 5' 1"  (1.549 m), weight 285 lb (129.3 kg), SpO2 98 %. Body mass index is 53.85 kg/m. Physical Exam Vitals signs reviewed.  Constitutional:      Appearance: Normal appearance. She is obese.  Cardiovascular:     Rate and Rhythm: Normal rate.     Pulses: Normal pulses.  Pulmonary:     Effort: Pulmonary effort is normal.     Breath sounds: Normal breath sounds.  Musculoskeletal: Normal range of motion.  Skin:    General: Skin is warm and dry.  Neurological:     Mental Status: She is alert and oriented to person, place, and time.  Psychiatric:        Mood and Affect: Mood normal.        Behavior: Behavior normal.     RECENT LABS AND TESTS: BMET    Component Value Date/Time   NA 141 12/29/2018 1546   K 4.1 12/29/2018 1546   CL 100 12/29/2018 1546   CO2 23 12/29/2018 1546   GLUCOSE 76 12/29/2018 1546   GLUCOSE 101 (H) 03/02/2018 1625   BUN 18 12/29/2018 1546   CREATININE 0.72 12/29/2018 1546   CREATININE 0.77 12/26/2014 1153   CALCIUM 9.1 12/29/2018 1546   GFRNONAA 92 12/29/2018 1546   GFRAA 106 12/29/2018 1546   Lab Results  Component Value Date   HGBA1C 6.1 (H) 12/29/2018   HGBA1C 6.2 (A) 09/24/2018   HGBA1C 6.1 01/30/2018   HGBA1C 5.8 07/15/2017   HGBA1C 6.1 04/15/2017   Lab Results  Component Value Date   INSULIN 12.2 12/29/2018   CBC    Component Value Date/Time   WBC 7.9 12/29/2018 1546   WBC 9.1 03/02/2018 1625   RBC 4.63 12/29/2018 1546   RBC 4.89 03/02/2018 1625   HGB 11.8  12/29/2018 1546   HCT 36.2 12/29/2018 1546   PLT 341 03/02/2018 1625   MCV 78 (L) 12/29/2018 1546   MCH 25.5 (L) 12/29/2018 1546   MCH 25.6 (L) 03/02/2018 1625   MCHC 32.6 12/29/2018 1546  MCHC 32.6 03/02/2018 1625   RDW 15.7 (H) 12/29/2018 1546   LYMPHSABS 3.0 12/29/2018 1546   EOSABS 0.4 12/29/2018 1546   BASOSABS 0.1 12/29/2018 1546   Iron/TIBC/Ferritin/ %Sat No results found for: IRON, TIBC, FERRITIN, IRONPCTSAT Lipid Panel     Component Value Date/Time   CHOL 137 12/29/2018 1546   TRIG 82 12/29/2018 1546   HDL 57 12/29/2018 1546   CHOLHDL 2.1 09/24/2018 1637   CHOLHDL 2.0 11/10/2014 1148   VLDL 21 11/10/2014 1148   LDLCALC 64 12/29/2018 1546   Hepatic Function Panel     Component Value Date/Time   PROT 6.7 12/29/2018 1546   ALBUMIN 3.6 (L) 12/29/2018 1546   AST 17 12/29/2018 1546   ALT 17 12/29/2018 1546   ALKPHOS 85 12/29/2018 1546   BILITOT 0.7 12/29/2018 1546   BILIDIR 0.2 03/03/2018 0307   IBILI 1.2 (H) 03/03/2018 0307      Component Value Date/Time   TSH 0.977 12/29/2018 1546      OBESITY BEHAVIORAL INTERVENTION VISIT  Today's visit was # 2   Starting weight: 291 lbs Starting date: 12/29/2018 Today's weight : 285 lbs  Today's date: 01/12/2019 Total lbs lost to date: 6    ASK: We discussed the diagnosis of obesity with Linda Cooper today and Linda Cooper agreed to give [place] permission to discuss obesity behavioral modification therapy today.  ASSESS: Linda Cooper has the diagnosis of obesity and her BMI today is 52.88 Linda Cooper is in the action stage of change   ADVISE: Linda Cooper was educated on the multiple health risks of obesity as well as the benefit of weight loss to improve her health. She was advised of the need for long term treatment and the importance of lifestyle modifications to improve her current health and to decrease her risk of future health problems.  AGREE: Multiple dietary modification options and treatment options were discussed  and  Linda Cooper agreed to follow the recommendations documented in the above note.  ARRANGE: Linda Cooper was educated on the importance of frequent visits to treat obesity as outlined per CMS and USPSTF guidelines and agreed to schedule her next follow up appointment today.  I, Trixie Dredge, am acting as transcriptionist for Ilene Qua, MD  I have reviewed the above documentation for accuracy and completeness, and I agree with the above. - Ilene Qua, MD

## 2019-01-15 MED FILL — CONTOUR NEXT STRIPS: 50 days supply | Qty: 50 | Fill #1

## 2019-01-25 ENCOUNTER — Ambulatory Visit: Payer: BLUE CROSS/BLUE SHIELD | Attending: Internal Medicine | Admitting: *Deleted

## 2019-01-25 ENCOUNTER — Ambulatory Visit: Payer: BLUE CROSS/BLUE SHIELD | Admitting: Internal Medicine

## 2019-01-25 DIAGNOSIS — Z111 Encounter for screening for respiratory tuberculosis: Secondary | ICD-10-CM

## 2019-01-25 NOTE — Progress Notes (Signed)
Tuberculin skin test applied to left  ventral forearm. Explained how to read the test, measuring induration not just erythema; she will come into office on Thursday 01/28/2019.

## 2019-01-27 ENCOUNTER — Ambulatory Visit: Payer: BLUE CROSS/BLUE SHIELD | Attending: Internal Medicine

## 2019-01-27 ENCOUNTER — Other Ambulatory Visit: Payer: Self-pay | Admitting: Internal Medicine

## 2019-01-27 DIAGNOSIS — E119 Type 2 diabetes mellitus without complications: Secondary | ICD-10-CM

## 2019-01-27 LAB — TB SKIN TEST
Induration: 0.1 mm
TB Skin Test: NEGATIVE

## 2019-01-27 MED FILL — LOSARTAN-HCTZ 100-25 MG TAB: 100-25 | 30 days supply | Qty: 30 | Fill #3

## 2019-01-28 ENCOUNTER — Encounter (INDEPENDENT_AMBULATORY_CARE_PROVIDER_SITE_OTHER): Payer: Self-pay | Admitting: Family Medicine

## 2019-01-28 ENCOUNTER — Ambulatory Visit (INDEPENDENT_AMBULATORY_CARE_PROVIDER_SITE_OTHER): Payer: BLUE CROSS/BLUE SHIELD | Admitting: Family Medicine

## 2019-01-28 VITALS — BP 176/99 | HR 76 | Temp 98.5°F | Ht 61.0 in | Wt 281.0 lb

## 2019-01-28 DIAGNOSIS — Z6841 Body Mass Index (BMI) 40.0 and over, adult: Secondary | ICD-10-CM | POA: Diagnosis not present

## 2019-01-28 DIAGNOSIS — E1165 Type 2 diabetes mellitus with hyperglycemia: Secondary | ICD-10-CM | POA: Diagnosis not present

## 2019-01-28 DIAGNOSIS — I10 Essential (primary) hypertension: Secondary | ICD-10-CM | POA: Diagnosis not present

## 2019-01-28 MED FILL — METFORMIN HCL ER 500 MG TAB: 500 | 30 days supply | Qty: 30 | Fill #0

## 2019-02-01 NOTE — Progress Notes (Signed)
Office: [phone]  /  Fax: [phone]   HPI:   Chief Complaint: [place] is here to discuss her progress with her obesity treatment plan. She is on the Category 2 plan + 100 calories + greek yogurt or 1/2 cup of 2% cottage cheese and is following her eating plan approximately 80 % of the time. She states she is exercising 0 minutes 0 times per week. Linda Cooper states the last few weeks she has been eating out a bit more, secondary to schedule being busier. She is trying to make healthier choices when eating out.  Her weight is 281 lb (127.5 kg) today and has had a weight loss of 4 pounds over a period of 2 weeks since her last visit. She has lost 10 lbs since starting treatment with [place].  Diabetes II with Hyperglycemia Linda Cooper has a diagnosis of diabetes type II. Linda Cooper hasn't started Rybelsus. She states fasting BGs range between 81 and 127 and denies hypoglycemia. Last A1c was 6.1. She has been working on intensive lifestyle modifications including diet, exercise, and weight loss to help control her blood glucose levels.  Hypertension Linda Cooper is a 58 y.o. female with hypertension. Linda Cooper's blood pressure is uncontrolled today. She took her blood pressure medications today, but she notes increased stress at home. She denies chest pain, chest pressure, or headaches. She is working on weight loss to help control her blood pressure with the goal of decreasing her risk of heart attack and stroke.   ASSESSMENT AND PLAN:  Type 2 diabetes mellitus with hyperglycemia, without long-term current use of insulin (HCC)  Essential hypertension  Class 3 severe obesity with serious comorbidity and body mass index (BMI) of 50.0 to 59.9 in adult, unspecified obesity type ([place])  PLAN:  Diabetes II with Hyperglycemia Linda Cooper has been given extensive diabetes education by myself today including ideal fasting and post-prandial blood glucose readings, individual ideal Hgb A1c goals  and hypoglycemia prevention. We discussed the importance of good blood sugar control to decrease the likelihood of diabetic complications such as nephropathy, neuropathy, limb loss, blindness, coronary artery disease, and death. We discussed the importance of intensive lifestyle modification including diet, exercise and weight loss as the first line treatment for diabetes. Terrianne is to pick up Rybelsus, she will continue monitoring blood sugars, and will continue her current diabetes medications. Linda Cooper agrees to follow up with our clinic in 2 weeks.  Hypertension We discussed sodium restriction, working on healthy weight loss, and a regular exercise program as the means to achieve improved blood pressure control. Linda Cooper agreed with this plan and agreed to follow up as directed. We will continue to monitor her blood pressure as well as her progress with the above lifestyle modifications. Linda Cooper will continue her medications and will watch for signs of hypotension as she continues her lifestyle modifications. We will follow up on blood pressure at next appointment, blood pressure was previously controlled when she was taking her medications. Linda Cooper agrees to follow up with our clinic in 2 weeks.  I spent > than 50% of the 15 minute visit on counseling as documented in the note.  Obesity Linda Cooper is currently in the action stage of change. As such, her goal is to continue with weight loss efforts She has agreed to follow the Category 2 plan + 100 calories + greek yogurt or 1/2 cup of 2% [place] has been instructed to work up to a goal of 150 minutes of combined cardio and strengthening exercise  per week for weight loss and overall health benefits. We discussed the following Behavioral Modification Strategies today: increasing lean protein intake, decrease eating out and work on meal planning and easy cooking plans Linda Cooper is to decrease eating out to 2 times per week  maximum.  Latresha has agreed to follow up with our clinic in 2 weeks. She was informed of the importance of frequent follow up visits to maximize her success with intensive lifestyle modifications for her multiple health conditions.  ALLERGIES: Allergies  Allergen Reactions  . Ace Inhibitors Cough  . Erythromycin Base     Cramping in abdomen is reaction/no rash    MEDICATIONS: Current Outpatient Medications on File Prior to Visit  Medication Sig Dispense Refill  . albuterol (PROVENTIL HFA;VENTOLIN HFA) 108 (90 Base) MCG/ACT inhaler Inhale 2 puffs into the lungs every 4 (four) hours as needed for wheezing. 1 Inhaler 0  . Blood Glucose Monitoring Suppl (CONTOUR NEXT MONITOR) w/Device KIT 1 Units by Does not apply route daily. 1 kit 0  . glucose blood (CONTOUR NEXT TEST) test strip Use as instructed once daily 100 each 12  . hydrALAZINE (APRESOLINE) 50 MG tablet Take 1 tablet (50 mg total) by mouth 3 (three) times daily. 90 tablet 5  . losartan-hydrochlorothiazide (HYZAAR) 100-25 MG tablet Take 1 tablet by mouth daily. 30 tablet 6  . metFORMIN (GLUCOPHAGE-XR) 500 MG 24 hr tablet TAKE 1 TABLET (500 MG TOTAL) BY MOUTH DAILY WITH BREAKFAST. 30 tablet 0  . MICROLET LANCETS MISC Use as directed once daily 100 each 12  . Multiple Vitamins-Minerals (MULTIVITAMIN WITH MINERALS) tablet Take 1 tablet by mouth daily.    . naproxen (NAPROSYN) 500 MG tablet Take 1 tablet (500 mg total) by mouth 2 (two) times daily. 60 tablet 0  . NON FORMULARY SEGA PRO BLADDER HEALTH 100 MG DAILY    . Semaglutide (RYBELSUS) 3 MG TABS Take 3 mg by mouth daily. 30 tablet 0  . Vitamin D, Ergocalciferol, (DRISDOL) 1.25 MG (50000 UT) CAPS capsule Take 1 capsule (50,000 Units total) by mouth every 7 (seven) days. 4 capsule 0   No current facility-administered medications on file prior to visit.     PAST MEDICAL HISTORY: Past Medical History:  Diagnosis Date  . 'light-for-dates' infant with signs of fetal malnutrition    . Anemia   . Arthritis   . Asthma   . Diabetes mellitus without complication ([place]) Dx 6948  . GERD (gastroesophageal reflux disease)   . Hypertension Dx 1990  . Knee pain, bilateral   . Obesity   . Rheumatic fever childhood    has intermittent murmur and hand arthritis     PAST SURGICAL HISTORY: Past Surgical History:  Procedure Laterality Date  . ABDOMINAL HYSTERECTOMY  2006   . HERNIA REPAIR  5462    Umbilical     SOCIAL HISTORY: Social History   Tobacco Use  . Smoking status: Never Smoker  . Smokeless tobacco: Never Used  Substance Use Topics  . Alcohol use: No  . Drug use: No    FAMILY HISTORY: Family History  Problem Relation Age of Onset  . Hypertension Mother   . Diabetes Mother   . Stroke Mother   . Heart disease Mother   . Obesity Mother   . Hypertension Father   . Diabetes Father   . Stroke Father   . Obesity Father   . Heart disease Father   . Hyperlipidemia Father   . Diabetes Brother   . Hypertension Brother   .  Gout Brother   . Heart disease Sister   . Breast cancer Cousin   . Cancer Neg Hx     ROS: Review of Systems  Constitutional: Positive for weight loss.  Cardiovascular: Negative for chest pain.       Negative chest pressure  Neurological: Negative for headaches.  Endo/Heme/Allergies:       Negative hypoglycemia    PHYSICAL EXAM: Blood pressure (!) 176/99, pulse 76, temperature 98.5 F (36.9 C), temperature source Oral, height 5' 1"  (1.549 m), weight 281 lb (127.5 kg), SpO2 96 %. Body mass index is 53.09 kg/m. Physical Exam Vitals signs reviewed.  Constitutional:      Appearance: Normal appearance. She is obese.  Cardiovascular:     Rate and Rhythm: Normal rate.     Pulses: Normal pulses.  Pulmonary:     Effort: Pulmonary effort is normal.     Breath sounds: Normal breath sounds.  Musculoskeletal: Normal range of motion.  Skin:    General: Skin is warm and dry.  Neurological:     Mental Status: She is alert and  oriented to person, place, and time.  Psychiatric:        Mood and Affect: Mood normal.        Behavior: Behavior normal.     RECENT LABS AND TESTS: BMET    Component Value Date/Time   NA 141 12/29/2018 1546   K 4.1 12/29/2018 1546   CL 100 12/29/2018 1546   CO2 23 12/29/2018 1546   GLUCOSE 76 12/29/2018 1546   GLUCOSE 101 (H) 03/02/2018 1625   BUN 18 12/29/2018 1546   CREATININE 0.72 12/29/2018 1546   CREATININE 0.77 12/26/2014 1153   CALCIUM 9.1 12/29/2018 1546   GFRNONAA 92 12/29/2018 1546   GFRAA 106 12/29/2018 1546   Lab Results  Component Value Date   HGBA1C 6.1 (H) 12/29/2018   HGBA1C 6.2 (A) 09/24/2018   HGBA1C 6.1 01/30/2018   HGBA1C 5.8 07/15/2017   HGBA1C 6.1 04/15/2017   Lab Results  Component Value Date   INSULIN 12.2 12/29/2018   CBC    Component Value Date/Time   WBC 7.9 12/29/2018 1546   WBC 9.1 03/02/2018 1625   RBC 4.63 12/29/2018 1546   RBC 4.89 03/02/2018 1625   HGB 11.8 12/29/2018 1546   HCT 36.2 12/29/2018 1546   PLT 341 03/02/2018 1625   MCV 78 (L) 12/29/2018 1546   MCH 25.5 (L) 12/29/2018 1546   MCH 25.6 (L) 03/02/2018 1625   MCHC 32.6 12/29/2018 1546   MCHC 32.6 03/02/2018 1625   RDW 15.7 (H) 12/29/2018 1546   LYMPHSABS 3.0 12/29/2018 1546   EOSABS 0.4 12/29/2018 1546   BASOSABS 0.1 12/29/2018 1546   Iron/TIBC/Ferritin/ %Sat No results found for: IRON, TIBC, FERRITIN, IRONPCTSAT Lipid Panel     Component Value Date/Time   CHOL 137 12/29/2018 1546   TRIG 82 12/29/2018 1546   HDL 57 12/29/2018 1546   CHOLHDL 2.1 09/24/2018 1637   CHOLHDL 2.0 11/10/2014 1148   VLDL 21 11/10/2014 1148   LDLCALC 64 12/29/2018 1546   Hepatic Function Panel     Component Value Date/Time   PROT 6.7 12/29/2018 1546   ALBUMIN 3.6 (L) 12/29/2018 1546   AST 17 12/29/2018 1546   ALT 17 12/29/2018 1546   ALKPHOS 85 12/29/2018 1546   BILITOT 0.7 12/29/2018 1546   BILIDIR 0.2 03/03/2018 0307   IBILI 1.2 (H) 03/03/2018 0307      Component Value  Date/Time   TSH  0.977 12/29/2018 1546      OBESITY BEHAVIORAL INTERVENTION VISIT  Today's visit was # 3   Starting weight: 291 lbs Starting date: 12/29/2018 Today's weight : 281 lbs  Today's date: 01/28/2019 Total lbs lost to date: 10    ASK: We discussed the diagnosis of obesity with [name] today and Linda Cooper agreed to give [place] permission to discuss obesity behavioral modification therapy today.  ASSESS: Jamita has the diagnosis of obesity and her BMI today is 53.12 Linda Cooper is in the action stage of change   ADVISE: Linda Cooper was educated on the multiple health risks of obesity as well as the benefit of weight loss to improve her health. She was advised of the need for long term treatment and the importance of lifestyle modifications to improve her current health and to decrease her risk of future health problems.  AGREE: Multiple dietary modification options and treatment options were discussed and  Linda Cooper agreed to follow the recommendations documented in the above note.  ARRANGE: Zakariah was educated on the importance of frequent visits to treat obesity as outlined per CMS and USPSTF guidelines and agreed to schedule her next follow up appointment today.  I, Trixie Dredge, am acting as transcriptionist for Ilene Qua, MD  I have reviewed the above documentation for accuracy and completeness, and I agree with the above. - Ilene Qua, MD

## 2019-02-12 ENCOUNTER — Encounter: Payer: Self-pay | Admitting: Internal Medicine

## 2019-02-12 ENCOUNTER — Ambulatory Visit: Payer: BLUE CROSS/BLUE SHIELD | Attending: Internal Medicine | Admitting: Internal Medicine

## 2019-02-12 VITALS — BP 124/79 | HR 75 | Temp 97.4°F | Resp 16 | Wt 283.8 lb

## 2019-02-12 DIAGNOSIS — Z1211 Encounter for screening for malignant neoplasm of colon: Secondary | ICD-10-CM

## 2019-02-12 DIAGNOSIS — Z6379 Other stressful life events affecting family and household: Secondary | ICD-10-CM

## 2019-02-12 DIAGNOSIS — I10 Essential (primary) hypertension: Secondary | ICD-10-CM | POA: Diagnosis not present

## 2019-02-12 DIAGNOSIS — Z2821 Immunization not carried out because of patient refusal: Secondary | ICD-10-CM

## 2019-02-12 DIAGNOSIS — Z6841 Body Mass Index (BMI) 40.0 and over, adult: Secondary | ICD-10-CM

## 2019-02-12 DIAGNOSIS — M170 Bilateral primary osteoarthritis of knee: Secondary | ICD-10-CM | POA: Diagnosis not present

## 2019-02-12 DIAGNOSIS — E119 Type 2 diabetes mellitus without complications: Secondary | ICD-10-CM

## 2019-02-12 LAB — GLUCOSE, POCT (MANUAL RESULT ENTRY): POC Glucose: 100 mg/dl — AB (ref 70–99)

## 2019-02-12 MED ORDER — METFORMIN HCL ER 500 MG PO TB24: 500.0000 mg | ORAL_TABLET | Freq: Every day | ORAL | 6 refills | Status: DC

## 2019-02-12 MED ORDER — HYDRALAZINE HCL 50 MG PO TABS: 50.0000 mg | ORAL_TABLET | Freq: Three times a day (TID) | ORAL | 5 refills | Status: DC

## 2019-02-12 NOTE — Progress Notes (Signed)
Patient ID: Linda Cooper, female    DOB: 05-Feb-1961  MRN: [id number]  CC: Diabetes   Subjective: Linda Cooper is a 58 y.o. female who presents for chronic ds management Her concerns today include:  Hx of HTN, DM, obesity and OA knees  Obesity/DM:  Going to [facility].  Dr. Adair Patter wants her to start Semaglutide.  Waiting for approval by insurance.  Compliant with Metformin  Making changes in her diet but yesterday was "a drift day" because she ate some cake at work. Down 9 lbs since last visit Knees hurt more now so she has not been as active as she would like.  Thinks it is due to change in weather.  Checks blood sugars daily.  Range has been 103-1 29  Under some stress.  Mother now living with her due to health issues, working 2 jobs and brother in [place] is dealing with some health crisis due to liver disease.  Her mother will be going to [place] for several weeks to visit him.    HTN: Reports compliance with medication and salt restriction.  No chest pains or shortness of breath.  No lower extremity edema.  HM: Overdue for diabetic eye exam.  She reports that it is not covered by her insurance.  Overdue for colon cancer screening.  She is agreeable for referral to have this done.  She declines Pneumovax  Patient Active Problem List   Diagnosis Date Noted  . BMI 50.0-59.9, adult ([place]) 10/16/2018  . Morbid obesity ([place]) 11/11/2014  . PCOS (polycystic ovarian syndrome) 11/10/2014  . Degenerative arthritis of knee, bilateral 11/10/2014  . Diabetes mellitus without complication ([place])   . Hypertension      Current Outpatient Medications on File Prior to Visit  Medication Sig Dispense Refill  . albuterol (PROVENTIL HFA;VENTOLIN HFA) 108 (90 Base) MCG/ACT inhaler Inhale 2 puffs into the lungs every 4 (four) hours as needed for wheezing. 1 Inhaler 0  . Blood Glucose Monitoring Suppl (CONTOUR NEXT MONITOR) w/Device KIT 1 Units by Does not apply route  daily. 1 kit 0  . glucose blood (CONTOUR NEXT TEST) test strip Use as instructed once daily 100 each 12  . losartan-hydrochlorothiazide (HYZAAR) 100-25 MG tablet Take 1 tablet by mouth daily. 30 tablet 6  . MICROLET LANCETS MISC Use as directed once daily 100 each 12  . Multiple Vitamins-Minerals (MULTIVITAMIN WITH MINERALS) tablet Take 1 tablet by mouth daily.    . naproxen (NAPROSYN) 500 MG tablet Take 1 tablet (500 mg total) by mouth 2 (two) times daily. 60 tablet 0  . NON FORMULARY SEGA PRO BLADDER HEALTH 100 MG DAILY    . Semaglutide (RYBELSUS) 3 MG TABS Take 3 mg by mouth daily. 30 tablet 0  . Vitamin D, Ergocalciferol, (DRISDOL) 1.25 MG (50000 UT) CAPS capsule Take 1 capsule (50,000 Units total) by mouth every 7 (seven) days. 4 capsule 0   No current facility-administered medications on file prior to visit.     Allergies  Allergen Reactions  . Ace Inhibitors Cough  . Erythromycin Base     Cramping in abdomen is reaction/no rash    Social History   Socioeconomic History  . Marital status: Single    Spouse name: Not on file  . Number of children: 1   . Years of education: college   . Highest education level: Not on file  Occupational History  . 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 Financial resource strain: Not on file  . Food insecurity:    Worry: Not on file    Inability: Not on file  . Transportation needs:    Medical: Not on file    Non-medical: Not on file  Tobacco Use  . Smoking status: Never Smoker  . Smokeless tobacco: Never Used  Substance and Sexual Activity  . Alcohol use: No  . Drug use: No  . Sexual activity: Not on file  Lifestyle  . Physical activity:    Days per week: Not on file    Minutes per session: Not on file  . Stress: Not on file  Relationships  . Social connections:    Talks on phone: Not on file    Gets together: Not on file    Attends religious service: Not on file    Active member  of club or organization: Not on file    Attends meetings of clubs or organizations: Not on file    Relationship status: Not on file  . Intimate partner violence:    Fear of current or ex partner: Not on file    Emotionally abused: Not on file    Physically abused: Not on file    Forced sexual activity: Not on file  Other Topics Concern  . Not on file  Social History Narrative   Lives at home with son.    Son is 43.       She is motivated to lose weight and improve health by the expected death of her father  2017/01/20) and the sudden deaths of her cousin (found dead in car one month after death of his wife 03/20/2017)  and his wife (died after childbirth 02-20-17)    Exercise: minimal     Family History  Problem Relation Age of Onset  . Hypertension Mother   . Diabetes Mother   . Stroke Mother   . Heart disease Mother   . Obesity Mother   . Hypertension Father   . Diabetes Father   . Stroke Father   . Obesity Father   . Heart disease Father   . Hyperlipidemia Father   . Diabetes Brother   . Hypertension Brother   . Gout Brother   . Heart disease Sister   . Breast cancer Cousin   . Cancer Neg Hx     Past Surgical History:  Procedure Laterality Date  . ABDOMINAL HYSTERECTOMY  02-20-05   . HERNIA REPAIR  6644    Umbilical     ROS: Review of Systems Negative except as stated above  PHYSICAL EXAM: BP 124/79   Pulse 75   Temp (!) 97.4 F (36.3 C) (Oral)   Resp 16   Wt 283 lb 12.8 oz (128.7 kg)   SpO2 99%   BMI 53.62 kg/m   Wt Readings from Last 3 Encounters:  02/12/19 283 lb 12.8 oz (128.7 kg)  01/28/19 281 lb (127.5 kg)  01/12/19 285 lb (129.3 kg)   Physical Exam  General appearance - alert, well appearing, and in no distress Mental status - normal mood, behavior, speech, dress, motor activity, and thought processes Neck - supple, no significant adenopathy Chest - clear to auscultation, no wheezes, rales or rhonchi, symmetric air entry Heart - normal rate, regular  rhythm, normal S1, S2, no murmurs, rubs, clicks or gallops Extremities - peripheral pulses normal, no pedal edema, no clubbing or cyanosis Diabetic Foot Exam - Simple   Simple Foot Form  Visual Inspection See comments:  Yes Sensation Testing Intact to touch and monofilament testing bilaterally:  Yes Pulse Check Posterior Tibialis and Dorsalis pulse intact bilaterally:  Yes Comments Feet dry.  Small callous lateral aspect of RT big toe.       CMP Latest Ref Rng & Units 12/29/2018 09/24/2018 03/03/2018  Glucose 65 - 99 mg/dL 76 87 -  BUN 6 - 24 mg/dL 18 25(H) -  Creatinine 0.57 - 1.00 mg/dL 0.72 0.71 -  Sodium 134 - 144 mmol/L 141 139 -  Potassium 3.5 - 5.2 mmol/L 4.1 3.8 -  Chloride 96 - 106 mmol/L 100 99 -  CO2 20 - 29 mmol/L 23 26 -  Calcium 8.7 - 10.2 mg/dL 9.1 9.3 -  Total Protein 6.0 - 8.5 g/dL 6.7 7.2 6.3(L)  Total Bilirubin 0.0 - 1.2 mg/dL 0.7 0.6 1.4(H)  Alkaline Phos 39 - 117 IU/L 85 95 69  AST 0 - 40 IU/L 17 16 16   ALT 0 - 32 IU/L 17 16 13(L)   Lipid Panel     Component Value Date/Time   CHOL 137 12/29/2018 1546   TRIG 82 12/29/2018 1546   HDL 57 12/29/2018 1546   CHOLHDL 2.1 09/24/2018 1637   CHOLHDL 2.0 11/10/2014 1148   VLDL 21 11/10/2014 1148   LDLCALC 64 12/29/2018 1546    CBC    Component Value Date/Time   WBC 7.9 12/29/2018 1546   WBC 9.1 03/02/2018 1625   RBC 4.63 12/29/2018 1546   RBC 4.89 03/02/2018 1625   HGB 11.8 12/29/2018 1546   HCT 36.2 12/29/2018 1546   PLT 341 03/02/2018 1625   MCV 78 (L) 12/29/2018 1546   MCH 25.5 (L) 12/29/2018 1546   MCH 25.6 (L) 03/02/2018 1625   MCHC 32.6 12/29/2018 1546   MCHC 32.6 03/02/2018 1625   RDW 15.7 (H) 12/29/2018 1546   LYMPHSABS 3.0 12/29/2018 1546   EOSABS 0.4 12/29/2018 1546   BASOSABS 0.1 12/29/2018 1546   Results for orders placed or performed in visit on 02/12/19  POCT glucose (manual entry)  Result Value Ref Range   POC Glucose 100 (A) 70 - 99 mg/dl   Lab Results  Component Value Date     HGBA1C 6.1 (H) 12/29/2018   ASSESSMENT AND PLAN: 1. Diabetes mellitus without complication (HCC) Continue metformin.  She will also be starting GLP 1 Commended her on changes that she has been making so far in her eating habits.  Encouraged her to do more in terms of physical activity - POCT glucose (manual entry) - metFORMIN (GLUCOPHAGE-XR) 500 MG 24 hr tablet; Take 1 tablet (500 mg total) by mouth daily with breakfast.  Dispense: 30 tablet; Refill: 6  2. Essential hypertension At goal. - hydrALAZINE (APRESOLINE) 50 MG tablet; Take 1 tablet (50 mg total) by mouth 3 (three) times daily.  Dispense: 90 tablet; Refill: 5  3. Morbid obesity ([place]) See #1 above.  Currently going to goal weight management program  4. Primary osteoarthritis of both knees Encourage weight loss.  Okay to use Naprosyn as needed  5. Stressful life event affecting family Has good family support  6. 23-polyvalent pneumococcal polysaccharide vaccine declined   7. Colon cancer screening - Ambulatory referral to Gastroenterology    Patient was given the opportunity to ask questions.  Patient verbalized understanding of the plan and was able to repeat key elements of the plan.   Orders Placed This Encounter  Procedures  . Ambulatory referral to Gastroenterology  . POCT  glucose (manual entry)     Requested Prescriptions   Signed Prescriptions Disp Refills  . hydrALAZINE (APRESOLINE) 50 MG tablet 90 tablet 5    Sig: Take 1 tablet (50 mg total) by mouth 3 (three) times daily.  . metFORMIN (GLUCOPHAGE-XR) 500 MG 24 hr tablet 30 tablet 6    Sig: Take 1 tablet (500 mg total) by mouth daily with breakfast.    Return in about 4 months (around 06/14/2019).  Karle Plumber, MD, FACP

## 2019-02-12 NOTE — Patient Instructions (Signed)
I have submitted referral to gastroenterology for you to have your colonoscopy done.  Please try to have the eye exam done.  It is recommended that diabetics have eye exam done at least once a year.

## 2019-02-15 ENCOUNTER — Encounter (INDEPENDENT_AMBULATORY_CARE_PROVIDER_SITE_OTHER): Payer: Self-pay

## 2019-02-15 ENCOUNTER — Ambulatory Visit (INDEPENDENT_AMBULATORY_CARE_PROVIDER_SITE_OTHER): Payer: BLUE CROSS/BLUE SHIELD | Admitting: Family Medicine

## 2019-03-02 ENCOUNTER — Encounter (INDEPENDENT_AMBULATORY_CARE_PROVIDER_SITE_OTHER): Payer: Self-pay

## 2019-03-03 ENCOUNTER — Encounter (INDEPENDENT_AMBULATORY_CARE_PROVIDER_SITE_OTHER): Payer: Self-pay

## 2019-03-04 ENCOUNTER — Encounter (INDEPENDENT_AMBULATORY_CARE_PROVIDER_SITE_OTHER): Payer: Self-pay

## 2019-03-04 ENCOUNTER — Ambulatory Visit (INDEPENDENT_AMBULATORY_CARE_PROVIDER_SITE_OTHER): Payer: BLUE CROSS/BLUE SHIELD | Admitting: Family Medicine

## 2019-03-04 ENCOUNTER — Telehealth: Payer: Self-pay | Admitting: Internal Medicine

## 2019-03-04 NOTE — Telephone Encounter (Signed)
CDC recommends close contacts for patient with suspected or known COVID-19 should self monitor their temperature and watch out for symptoms, limit outside interaction as much as possible for 14 days and self isolate if they develop symptoms.

## 2019-03-04 NOTE — Telephone Encounter (Signed)
Will forward to covering provider.

## 2019-03-04 NOTE — Telephone Encounter (Signed)
Returned pt call and provided pt with Dr. Alvis Lemmings response. Pt doesn't have any questions or concerns

## 2019-03-04 NOTE — Telephone Encounter (Signed)
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 Please follow up.  Advised to go to the UC or ED

## 2019-03-06 MED FILL — LOSARTAN-HCTZ 100-25 MG TAB: 100-25 | 30 days supply | Qty: 30 | Fill #4

## 2019-03-06 MED FILL — METFORMIN HCL ER 500 MG TAB: 500 | 30 days supply | Qty: 30 | Fill #0

## 2019-03-11 ENCOUNTER — Other Ambulatory Visit: Payer: Self-pay

## 2019-03-11 ENCOUNTER — Encounter (HOSPITAL_COMMUNITY): Payer: Self-pay | Admitting: Emergency Medicine

## 2019-03-11 ENCOUNTER — Ambulatory Visit (HOSPITAL_COMMUNITY)
Admission: EM | Admit: 2019-03-11 | Discharge: 2019-03-11 | Disposition: A | Discharge status: Home / Self Care | Payer: BLUE CROSS/BLUE SHIELD | Attending: Family Medicine | Admitting: Family Medicine

## 2019-03-11 DIAGNOSIS — K0889 Other specified disorders of teeth and supporting structures: Secondary | ICD-10-CM | POA: Diagnosis not present

## 2019-03-11 MED ORDER — PENICILLIN V POTASSIUM 500 MG PO TABS: 500.0000 mg | ORAL_TABLET | Freq: Three times a day (TID) | ORAL | 0 refills | Status: DC

## 2019-03-11 MED FILL — PENICILLIN VK 500 MG TABLET: 500 | 10 days supply | Qty: 30 | Fill #0

## 2019-03-11 NOTE — ED Notes (Signed)
Patient verbalizes understanding of discharge instructions. Opportunity for questioning and answers were provided. Patient discharged from [facility] by MD. 

## 2019-03-11 NOTE — ED Triage Notes (Addendum)
Onset Tuesday of twinges in left job/cheek.  Yesterday noticed swelling to left side of face.  Pain in upper/left jaw.

## 2019-03-11 NOTE — ED Provider Notes (Signed)
[facility]   [id number] 03/11/19 Arrival Time: 1039  ASSESSMENT & PLAN:  1. Pain, dental    No sign of abscess requiring I&D at this time. Discussed.  Meds ordered this encounter  Medications  . penicillin v potassium (VEETID) 500 MG tablet    Sig: Take 1 tablet (500 mg total) by mouth 3 (three) times daily.    Dispense:  30 tablet    Refill:  0   Prefers OTC Aleve BID with food. Does not require work note. Dental resource written instructions given. She will schedule dental evaluation as soon as possible if not improving with PCN.  Reviewed expectations re: course of current medical issues. Questions answered. Outlined signs and symptoms indicating need for more acute intervention. Patient verbalized understanding. After Visit Summary given.   SUBJECTIVE:  Linda Cooper is a 58 y.o. female who reports gradual onset of left upper dental pain described as aching/throbbing; no specific hot or cold sensitivity. Present for 3 days. Fever: absent. Tolerating PO intake but reports pain with chewing. Normal swallowing. She does not see a dentist regularly. No neck swelling or pain. OTC Aleve with mild relief.  ROS: As per HPI. All other systems negative.   OBJECTIVE: Vitals:   03/11/19 1112  BP: (!) 163/87  Pulse: 86  Resp: 20  Temp: 98.1 F (36.7 C)  TempSrc: Oral  SpO2: 99%    General appearance: alert; no distress HENT: normocephalic; atraumatic; dentition: poor; left upper gums without areas of fluctuance, drainage, or bleeding; decayed left upper molars; gum tender to palpation; normal jaw movement without difficulty Neck: supple without LAD; FROM; trachea midline Lungs: normal respirations; unlabored Skin: warm and dry Psychological: alert and cooperative; normal mood and affect  Allergies  Allergen Reactions  . Ace Inhibitors Cough  . Erythromycin Base     Cramping in abdomen is reaction/no rash    Past Medical History:  Diagnosis Date  .  'light-for-dates' infant with signs of fetal malnutrition   . Anemia   . Arthritis   . Asthma   . Diabetes mellitus without complication (HCC) Dx 2007  . GERD (gastroesophageal reflux disease)   . Hypertension Dx 1990  . Knee pain, bilateral   . Obesity   . Rheumatic fever childhood    has intermittent murmur and hand arthritis    Social History   Socioeconomic History  . Marital status: Single    Spouse name: Not on file  . Number of children: 1   . Years of education: college   . Highest education level: Not on file  Occupational History  . 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 Financial resource strain: Not on file  . Food insecurity:    Worry: Not on file    Inability: Not on file  . Transportation needs:    Medical: Not on file    Non-medical: Not on file  Tobacco Use  . Smoking status: Never Smoker  . Smokeless tobacco: Never Used  Substance and Sexual Activity  . Alcohol use: No  . Drug use: No  . Sexual activity: Not on file  Lifestyle  . Physical activity:    Days per week: Not on file    Minutes per session: Not on file  . Stress: Not on file  Relationships  . Social connections:    Talks on phone: Not on file    Gets together: Not on file  Attends religious service: Not on file    Active member of club or organization: Not on file    Attends meetings of clubs or organizations: Not on file    Relationship status: Not on file  . Intimate partner violence:    Fear of current or ex partner: Not on file    Emotionally abused: Not on file    Physically abused: Not on file    Forced sexual activity: Not on file  Other Topics Concern  . Not on file  Social History Narrative   Lives at home with son.    Son is 74.       She is motivated to lose weight and improve health by the expected death of her father  2017-01-05) and the sudden deaths of her cousin (found dead in car one month after death of his wife  March 05, 2017)  and his wife (died after childbirth 05-Feb-2017)    Exercise: minimal    Family History  Problem Relation Age of Onset  . Hypertension Mother   . Diabetes Mother   . Stroke Mother   . Heart disease Mother   . Obesity Mother   . Hypertension Father   . Diabetes Father   . Stroke Father   . Obesity Father   . Heart disease Father   . Hyperlipidemia Father   . Diabetes Brother   . Hypertension Brother   . Gout Brother   . Heart disease Sister   . Breast cancer Cousin   . Cancer Neg Hx    Past Surgical History:  Procedure Laterality Date  . ABDOMINAL HYSTERECTOMY  2006   . HERNIA REPAIR  2006    Umbilical      Mardella Layman, MD 03/11/19 1136

## 2019-03-20 NOTE — Telephone Encounter (Signed)
done

## 2019-04-03 ENCOUNTER — Other Ambulatory Visit: Payer: Self-pay | Admitting: Internal Medicine

## 2019-04-03 DIAGNOSIS — I152 Hypertension secondary to endocrine disorders: Secondary | ICD-10-CM

## 2019-04-03 MED FILL — LOSARTAN-HCTZ 100-25 MG TAB: 100-25 | 30 days supply | Qty: 30 | Fill #5

## 2019-04-03 MED FILL — METFORMIN HCL ER 500 MG TAB: 500 | 30 days supply | Qty: 30 | Fill #1

## 2019-04-05 MED FILL — hydrALAZINE HCL 50 MG TABS: 50 | 30 days supply | Qty: 90 | Fill #0

## 2019-05-20 MED FILL — LOSARTAN-HCTZ 100-25 MG TAB: 100-25 | 30 days supply | Qty: 30 | Fill #6

## 2019-05-20 MED FILL — hydrALAZINE HCL 50 MG TABS: 50 | 30 days supply | Qty: 90 | Fill #1

## 2019-05-20 MED FILL — METFORMIN HCL ER 500 MG TAB: 500 | 30 days supply | Qty: 30 | Fill #2

## 2019-06-01 ENCOUNTER — Other Ambulatory Visit: Payer: Self-pay | Admitting: Internal Medicine

## 2019-06-01 DIAGNOSIS — Z20822 Contact with and (suspected) exposure to covid-19: Secondary | ICD-10-CM

## 2019-06-05 LAB — NOVEL CORONAVIRUS, NAA: SARS-CoV-2, NAA: NOT DETECTED

## 2019-06-08 ENCOUNTER — Telehealth: Payer: Self-pay | Admitting: Emergency Medicine

## 2019-06-08 NOTE — Telephone Encounter (Signed)
Patient name and DOB has been verified Patient was informed of lab results. Patient had no questions.  

## 2019-06-08 NOTE — Telephone Encounter (Signed)
-----   Message from Marin Roberts, [place] sent at 06/08/2019 12:33 PM EDT ----- Attempted to reach pt, no answer. Left vm to call back. Forwarding result note to pcp office to follow up on

## 2019-06-08 NOTE — Telephone Encounter (Signed)
Nurse called the patient's home phone number but received no answer and message was left on the voicemail for the patient to call back.  Return phone number given. 

## 2019-06-14 ENCOUNTER — Ambulatory Visit: Payer: BLUE CROSS/BLUE SHIELD | Admitting: Internal Medicine

## 2019-06-15 ENCOUNTER — Other Ambulatory Visit: Payer: Self-pay

## 2019-06-15 ENCOUNTER — Encounter: Payer: Self-pay | Admitting: Internal Medicine

## 2019-06-15 ENCOUNTER — Ambulatory Visit: Payer: BC Managed Care – PPO | Attending: Internal Medicine | Admitting: Internal Medicine

## 2019-06-15 DIAGNOSIS — K0889 Other specified disorders of teeth and supporting structures: Secondary | ICD-10-CM

## 2019-06-15 DIAGNOSIS — K029 Dental caries, unspecified: Secondary | ICD-10-CM | POA: Diagnosis not present

## 2019-06-15 MED ORDER — PENICILLIN V POTASSIUM 500 MG PO TABS: 500.0000 mg | ORAL_TABLET | Freq: Three times a day (TID) | ORAL | 0 refills | Status: DC

## 2019-06-15 MED FILL — PENICILLIN VK 500 MG TABLET: 500 | 7 days supply | Qty: 21 | Fill #0

## 2019-06-15 NOTE — Progress Notes (Signed)
Virtual Visit via Telephone Note Due to current restrictions/limitations of in-office visits due to the COVID-19 pandemic, this scheduled clinical appointment was converted to a telehealth visit  I connected with Linda Cooper on 06/15/19 at 9:23 a.m by telephone and verified that I am speaking with the correct person using two identifiers. I am in my office.  The patient is at home.  Only the patient and myself participated in this encounter.  I discussed the limitations, risks, security and privacy concerns of performing an evaluation and management service by telephone and the availability of in person appointments. I also discussed with the patient that there may be a patient responsible charge related to this service. The patient expressed understanding and agreed to proceed.   History of Present Illness: Hx of HTN, DM, obesity and OA knees  Pt c/o increasing dental pain x 1.5 days. Decayed tooth in upper LT jaw.  Thinks filling fell out.  Slight swelling of jaw on that side. Has insurance but no dental coverage.  Request referral to dentist She is taking Tylenol for pain with relief.  Outpatient Encounter Medications as of 06/15/2019  Medication Sig  . albuterol (PROVENTIL HFA;VENTOLIN HFA) 108 (90 Base) MCG/ACT inhaler Inhale 2 puffs into the lungs every 4 (four) hours as needed for wheezing.  . Blood Glucose Monitoring Suppl (CONTOUR NEXT MONITOR) w/Device KIT 1 Units by Does not apply route daily.  Marland Kitchen glucose blood (CONTOUR NEXT TEST) test strip Use as instructed once daily  . hydrALAZINE (APRESOLINE) 50 MG tablet Take 1 tablet (50 mg total) by mouth 3 (three) times daily.  Marland Kitchen losartan-hydrochlorothiazide (HYZAAR) 100-25 MG tablet Take 1 tablet by mouth daily.  . metFORMIN (GLUCOPHAGE-XR) 500 MG 24 hr tablet Take 1 tablet (500 mg total) by mouth daily with breakfast.  . MICROLET LANCETS MISC Use as directed once daily  . Multiple Vitamins-Minerals (MULTIVITAMIN WITH MINERALS)  tablet Take 1 tablet by mouth daily.  . naproxen (NAPROSYN) 500 MG tablet Take 1 tablet (500 mg total) by mouth 2 (two) times daily.  . NON FORMULARY SEGA PRO BLADDER HEALTH 100 MG DAILY  . Semaglutide (RYBELSUS) 3 MG TABS Take 3 mg by mouth daily.  . Vitamin D, Ergocalciferol, (DRISDOL) 1.25 MG (50000 UT) CAPS capsule Take 1 capsule (50,000 Units total) by mouth every 7 (seven) days.  . [DISCONTINUED] penicillin v potassium (VEETID) 500 MG tablet Take 1 tablet (500 mg total) by mouth 3 (three) times daily.   No facility-administered encounter medications on file as of 06/15/2019.       Observations/Objective: No direct observation done as this was a telephone encounter  Assessment and Plan: 1. Pain due to dental caries -We will give prescription for penicillin.  Submitted referral for her to see the dentist.  She can continue to use the Tylenol as needed for pain - penicillin v potassium (VEETID) 500 MG tablet; Take 1 tablet (500 mg total) by mouth 3 (three) times daily.  Dispense: 21 tablet; Refill: 0 - Ambulatory referral to Dentistry   Follow Up Instructions: Follow-up PRN.  She has a routine visit with me later this month.   I discussed the assessment and treatment plan with the patient. The patient was provided an opportunity to ask questions and all were answered. The patient agreed with the plan and demonstrated an understanding of the instructions.   The patient was advised to call back or seek an in-person evaluation if the symptoms worsen or if the condition fails to improve as  anticipated.  I provided 5 minutes of non-face-to-face time during this encounter.   Karle Plumber, MD

## 2019-06-15 NOTE — Progress Notes (Signed)
Patient verified DOB Patient has taken tylenol for pain. Patient has not eaten today. Patient has pain on the left upper side of her mouth from a tooth. Pain began a few days ago. Patient has not checked her CBG.

## 2019-07-01 ENCOUNTER — Other Ambulatory Visit: Payer: Self-pay | Admitting: Internal Medicine

## 2019-07-01 DIAGNOSIS — I152 Hypertension secondary to endocrine disorders: Secondary | ICD-10-CM

## 2019-07-01 MED FILL — LOSARTAN-HCTZ 100-25 MG TAB: 100-25 | 30 days supply | Qty: 30 | Fill #0

## 2019-07-01 MED FILL — METFORMIN HCL ER 500 MG TB2: 500 | 30 days supply | Qty: 30 | Fill #3

## 2019-07-02 MED FILL — CONTOUR NEXT STRIPS: 50 days supply | Qty: 50 | Fill #2

## 2019-07-05 ENCOUNTER — Other Ambulatory Visit: Payer: Self-pay

## 2019-07-05 DIAGNOSIS — Z20822 Contact with and (suspected) exposure to covid-19: Secondary | ICD-10-CM

## 2019-07-06 LAB — NOVEL CORONAVIRUS, NAA: SARS-CoV-2, NAA: NOT DETECTED

## 2019-07-09 ENCOUNTER — Other Ambulatory Visit: Payer: Self-pay

## 2019-07-09 ENCOUNTER — Ambulatory Visit: Payer: BC Managed Care – PPO | Attending: Internal Medicine | Admitting: Internal Medicine

## 2019-07-09 ENCOUNTER — Encounter: Payer: Self-pay | Admitting: Internal Medicine

## 2019-07-09 VITALS — BP 140/87 | HR 97 | Temp 98.3°F | Resp 16 | Wt 289.6 lb

## 2019-07-09 DIAGNOSIS — G5602 Carpal tunnel syndrome, left upper limb: Secondary | ICD-10-CM | POA: Insufficient documentation

## 2019-07-09 DIAGNOSIS — E119 Type 2 diabetes mellitus without complications: Secondary | ICD-10-CM | POA: Diagnosis not present

## 2019-07-09 DIAGNOSIS — M170 Bilateral primary osteoarthritis of knee: Secondary | ICD-10-CM

## 2019-07-09 DIAGNOSIS — I10 Essential (primary) hypertension: Secondary | ICD-10-CM

## 2019-07-09 DIAGNOSIS — Z6841 Body Mass Index (BMI) 40.0 and over, adult: Secondary | ICD-10-CM

## 2019-07-09 LAB — GLUCOSE, POCT (MANUAL RESULT ENTRY): POC Glucose: 123 mg/dl — AB (ref 70–99)

## 2019-07-09 MED ORDER — METFORMIN HCL 500 MG PO TABS: 500.0000 mg | ORAL_TABLET | Freq: Every day | ORAL | 3 refills | Status: DC

## 2019-07-09 MED ORDER — HYDRALAZINE HCL 50 MG PO TABS: 75.0000 mg | ORAL_TABLET | Freq: Two times a day (BID) | ORAL | 5 refills | Status: DC

## 2019-07-09 MED FILL — hydrALAZINE HCL 50 MG TABS: 50 | 30 days supply | Qty: 90 | Fill #0

## 2019-07-09 MED FILL — MICROLET LANCETS MISC: 90 days supply | Qty: 100 | Fill #1

## 2019-07-09 MED FILL — metFORMIN HCL 500 MG TABS: 500 | 30 days supply | Qty: 30 | Fill #0

## 2019-07-09 NOTE — Progress Notes (Signed)
Patient ID: Linda Cooper, female    DOB: 09/07/1961  MRN: [id number]  CC: Diabetes and Hypertension   Subjective: Linda Cooper is a 58 y.o. female who presents for chronic ds management. Her concerns today include:  Hx of HTN, DM, obesity and OA knees  Tooth pain has resolved.  DM/Obesity: When I saw her back in March she was seeing Dr. Adair Cooper with: [facility] program.  They were waiting on insurance approval to start her on Semaglutide.  However she is no longer in this program and never got the medication.  She states that her insurance did not cover for the program or the medication so she ended up with bills from the few visits that she had.   Med: Currently still on Metformin XR 500 mg once a day.   Checking BS QOD.  Range 107 to 150s.   Eating Habits:  Not good.  Eating a lot of take-out.  Too tired after work to [employer].  Working 2 jobs.  Wgh up 6 lbs from March. Does a lot of stop and go walking at work.  Not able to do as much as she would like due to knee pain Some numbness in finger tips at nights and sometimes during the day x 1 mth.  She is LT hand.  Does a lot of writing and typing on the job -Overdue for eye exam.  She is agreeable to referral  HTN:  Compliant with meds Has wrist cuff at home.  Checks BP a few times a mth.  Gives range 150-180/90 Reports compliance with losartan/HCTZ.  She is supposed to take hydralazine 3 times a day but has been taking only twice a day because she forgets to take the dose in the middle of the day.  OA Knee: Continues to be an issue for her.  She has severe arthritis and would probably benefit from knee replacement surgery.  However she states that this is not an option "if I don't work I don't get paid."  Has insurance but not through her job.  She has to pay for her own insurance  HM:  Referred to GI for c-scope on last visit.  She was called but did not go due to the [place] pandemic.  She wants to put this on  hold for now until at least the end of the year.  Patient Active Problem List   Diagnosis Date Noted  . BMI 50.0-59.9, adult ([place]) 10/16/2018  . Morbid obesity ([place]) 11/11/2014  . PCOS (polycystic ovarian syndrome) 11/10/2014  . Degenerative arthritis of knee, bilateral 11/10/2014  . Diabetes mellitus without complication ([place])   . Hypertension      Current Outpatient Medications on File Prior to Visit  Medication Sig Dispense Refill  . albuterol (PROVENTIL HFA;VENTOLIN HFA) 108 (90 Base) MCG/ACT inhaler Inhale 2 puffs into the lungs every 4 (four) hours as needed for wheezing. 1 Inhaler 0  . Blood Glucose Monitoring Suppl (CONTOUR NEXT MONITOR) w/Device KIT 1 Units by Does not apply route daily. 1 kit 0  . glucose blood (CONTOUR NEXT TEST) test strip Use as instructed once daily 100 each 12  . losartan-hydrochlorothiazide (HYZAAR) 100-25 MG tablet TAKE 1 TABLET BY MOUTH DAILY. 30 tablet 2  . MICROLET LANCETS MISC Use as directed once daily 100 each 12  . Multiple Vitamins-Minerals (MULTIVITAMIN WITH MINERALS) tablet Take 1 tablet by mouth daily.    . naproxen (NAPROSYN) 500 MG tablet Take 1 tablet (500 mg  total) by mouth 2 (two) times daily. 60 tablet 0  . NON FORMULARY SEGA PRO BLADDER HEALTH 100 MG DAILY    . Vitamin D, Ergocalciferol, (DRISDOL) 1.25 MG (50000 UT) CAPS capsule Take 1 capsule (50,000 Units total) by mouth every 7 (seven) days. 4 capsule 0   No current facility-administered medications on file prior to visit.     Allergies  Allergen Reactions  . Ace Inhibitors Cough  . Erythromycin Base     Cramping in abdomen is reaction/no rash    Social History   Socioeconomic History  . Marital status: Single    Spouse name: Not on file  . Number of children: 1   . Years of education: college   . Highest education level: Not on file  Occupational History  . 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 Financial resource  strain: Not on file  . Food insecurity    Worry: Not on file    Inability: Not on file  . Transportation needs    Medical: Not on file    Non-medical: Not on file  Tobacco Use  . Smoking status: Never Smoker  . Smokeless tobacco: Never Used  Substance and Sexual Activity  . Alcohol use: No  . Drug use: No  . Sexual activity: Not on file  Lifestyle  . Physical activity    Days per week: Not on file    Minutes per session: Not on file  . Stress: Not on file  Relationships  . Social Herbalist on phone: Not on file    Gets together: Not on file    Attends religious service: Not on file    Active member of club or organization: Not on file    Attends meetings of clubs or organizations: Not on file    Relationship status: Not on file  . Intimate partner violence    Fear of current or ex partner: Not on file    Emotionally abused: Not on file    Physically abused: Not on file    Forced sexual activity: Not on file  Other Topics Concern  . Not on file  Social History Narrative   Lives at home with son.    Son is 29.       She is motivated to lose weight and improve health by the expected death of her father  01-11-2017) and the sudden deaths of her cousin (found dead in car one month after death of his wife 2017-03-11)  and his wife (died after childbirth 02/11/2017)    Exercise: minimal     Family History  Problem Relation Age of Onset  . Hypertension Mother   . Diabetes Mother   . Stroke Mother   . Heart disease Mother   . Obesity Mother   . Hypertension Father   . Diabetes Father   . Stroke Father   . Obesity Father   . Heart disease Father   . Hyperlipidemia Father   . Diabetes Brother   . Hypertension Brother   . Gout Brother   . Heart disease Sister   . Breast cancer Cousin   . Cancer Neg Hx     Past Surgical History:  Procedure Laterality Date  . ABDOMINAL HYSTERECTOMY  2005/02/11   . HERNIA REPAIR  9024    Umbilical     ROS: Review of Systems Negative  except as stated above  PHYSICAL EXAM:  BP 140/87   Pulse 97   Temp 98.3 F (36.8 C) (Oral)   Resp 16   Wt 289 lb 9.6 oz (131.4 kg)   SpO2 99%   BMI 54.72 kg/m   Wt Readings from Last 3 Encounters:  07/09/19 289 lb 9.6 oz (131.4 kg)  02/12/19 283 lb 12.8 oz (128.7 kg)  01/28/19 281 lb (127.5 kg)   Physical Exam  General appearance - alert, well appearing, obese older African-American female and in no distress Mouth - mucous membranes moist, pharynx normal without lesions Neck - supple, no significant adenopathy Chest - clear to auscultation, no wheezes, rales or rhonchi, symmetric air entry Heart - normal rate, regular rhythm, normal S1, S2, no murmurs, rubs, clicks or gallops Extremities - peripheral pulses normal, no pedal edema, no clubbing or cyanosis MSK: No wasting of muscles in the hands.  Grip 5/5.  Positive Tinel sign on the left A1C today is 6.0 Results for orders placed or performed in visit on 07/09/19  POCT glucose (manual entry)  Result Value Ref Range   POC Glucose 123 (A) 70 - 99 mg/dl    CMP Latest Ref Rng & Units 12/29/2018 09/24/2018 03/03/2018  Glucose 65 - 99 mg/dL 76 87 -  BUN 6 - 24 mg/dL 18 25(H) -  Creatinine 0.57 - 1.00 mg/dL 0.72 0.71 -  Sodium 134 - 144 mmol/L 141 139 -  Potassium 3.5 - 5.2 mmol/L 4.1 3.8 -  Chloride 96 - 106 mmol/L 100 99 -  CO2 20 - 29 mmol/L 23 26 -  Calcium 8.7 - 10.2 mg/dL 9.1 9.3 -  Total Protein 6.0 - 8.5 g/dL 6.7 7.2 6.3(L)  Total Bilirubin 0.0 - 1.2 mg/dL 0.7 0.6 1.4(H)  Alkaline Phos 39 - 117 IU/L 85 95 69  AST 0 - 40 IU/L 17 16 16   ALT 0 - 32 IU/L 17 16 13(L)   Lipid Panel     Component Value Date/Time   CHOL 137 12/29/2018 1546   TRIG 82 12/29/2018 1546   HDL 57 12/29/2018 1546   CHOLHDL 2.1 09/24/2018 1637   CHOLHDL 2.0 11/10/2014 1148   VLDL 21 11/10/2014 1148   LDLCALC 64 12/29/2018 1546    CBC    Component Value Date/Time   WBC 7.9 12/29/2018 1546   WBC 9.1 03/02/2018 1625   RBC 4.63 12/29/2018  1546   RBC 4.89 03/02/2018 1625   HGB 11.8 12/29/2018 1546   HCT 36.2 12/29/2018 1546   PLT 341 03/02/2018 1625   MCV 78 (L) 12/29/2018 1546   MCH 25.5 (L) 12/29/2018 1546   MCH 25.6 (L) 03/02/2018 1625   MCHC 32.6 12/29/2018 1546   MCHC 32.6 03/02/2018 1625   RDW 15.7 (H) 12/29/2018 1546   LYMPHSABS 3.0 12/29/2018 1546   EOSABS 0.4 12/29/2018 1546   BASOSABS 0.1 12/29/2018 1546    ASSESSMENT AND PLAN:  1. Diabetes mellitus without complication ([place]) Commended her that her A1c is below 7.  Discussed and encourage healthy eating habits.  Encourage better meal planning so that she is not eating out as much. Change metformin XR to regular metformin due to recall of the former - POCT glucose (manual entry) - metFORMIN (GLUCOPHAGE) 500 MG tablet; Take 1 tablet (500 mg total) by mouth daily with breakfast.  Dispense: 90 tablet; Refill: 3 - Ambulatory referral to Ophthalmology  2. Morbid obesity ([place]) See #1 above.  Encouraged her to try to move as much as possible  3. Essential hypertension Not at  goal.  Since she remembers to take the hydralazine only twice a day we have changed the dose from 50 mg 3 times a day to 75 mg twice a day. - hydrALAZINE (APRESOLINE) 50 MG tablet; Take 1.5 tablets (75 mg total) by mouth 2 (two) times daily.  Dispense: 90 tablet; Refill: 5  4. Primary osteoarthritis of both knees Encourage weight loss as much as she possibly can.  She is not interested in seeing orthopedics right now to consider surgical option for reasons stated in the history above  5. Carpal tunnel syndrome of left wrist Discussed diagnosis and management.  Given trial of a wrist splint    Patient was given the opportunity to ask questions.  Patient verbalized understanding of the plan and was able to repeat key elements of the plan.   Orders Placed This Encounter  Procedures  . Ambulatory referral to Ophthalmology  . POCT glucose (manual entry)     Requested Prescriptions    Signed Prescriptions Disp Refills  . hydrALAZINE (APRESOLINE) 50 MG tablet 90 tablet 5    Sig: Take 1.5 tablets (75 mg total) by mouth 2 (two) times daily.  . metFORMIN (GLUCOPHAGE) 500 MG tablet 90 tablet 3    Sig: Take 1 tablet (500 mg total) by mouth daily with breakfast.    Return in about 4 months (around 11/08/2019).  Karle Plumber, MD, FACP

## 2019-07-09 NOTE — Patient Instructions (Signed)
We have changed the metformin from the extended release to the regular metformin.  The extended release metformin was recalled.  Your blood pressure goal is 130/80 or lower.  Please check your blood pressure at least once a week.  We have changed the hydralazine to 75 mg twice a day.  Try to limit salt in the foods.   Carpal Tunnel Syndrome  Carpal tunnel syndrome is a condition that causes pain in your hand and arm. The carpal tunnel is a narrow area that is on the palm side of your wrist. Repeated wrist motion or certain diseases may cause swelling in the tunnel. This swelling can pinch the main nerve in the wrist (median nerve). What are the causes? This condition may be caused by:  Repeated wrist motions.  Wrist injuries.  Arthritis.  A sac of fluid (cyst) or abnormal growth (tumor) in the carpal tunnel.  Fluid buildup during pregnancy. Sometimes the cause is not known. What increases the risk? The following factors may make you more likely to develop this condition:  Having a job in which you move your wrist in the same way many times. This includes jobs like being a Software engineer or a Scientist, water quality.  Being a woman.  Having other health conditions, such as: ? Diabetes. ? Obesity. ? A thyroid gland that is not active enough (hypothyroidism). ? Kidney failure. What are the signs or symptoms? Symptoms of this condition include:  A tingling feeling in your fingers.  Tingling or a loss of feeling (numbness) in your hand.  Pain in your entire arm. This pain may get worse when you bend your wrist and elbow for a long time.  Pain in your wrist that goes up your arm to your shoulder.  Pain that goes down into your palm or fingers.  A weak feeling in your hands. You may find it hard to grab and hold items. You may feel worse at night. How is this diagnosed? This condition is diagnosed with a medical history and physical exam. You may also have tests, such as:  Electromyogram (EMG).  This test checks the signals that the nerves send to the muscles.  Nerve conduction study. This test checks how well signals pass through your nerves.  Imaging tests, such as X-rays, ultrasound, and MRI. These tests check for what might be the cause of your condition. How is this treated? This condition may be treated with:  Lifestyle changes. You will be asked to stop or change the activity that caused your problem.  Doing exercise and activities that make bones and muscles stronger (physical therapy).  Learning how to use your hand again (occupational therapy).  Medicines for pain and swelling (inflammation). You may have injections in your wrist.  A wrist splint.  Surgery. Follow these instructions at home: If you have a splint:  Wear the splint as told by your doctor. Remove it only as told by your doctor.  Loosen the splint if your fingers: ? Tingle. ? Lose feeling (become numb). ? Turn cold and blue.  Keep the splint clean.  If the splint is not waterproof: ? Do not let it get wet. ? Cover it with a watertight covering when you take a bath or a shower. Managing pain, stiffness, and swelling   If told, put ice on the painful area: ? If you have a removable splint, remove it as told by your doctor. ? Put ice in a plastic bag. ? Place a towel between your skin and the bag. ?  Leave the ice on for 20 minutes, 2-3 times per day. General instructions  Take over-the-counter and prescription medicines only as told by your doctor.  Rest your wrist from any activity that may cause pain. If needed, talk with your boss at work about changes that can help your wrist heal.  Do any exercises as told by your doctor, physical therapist, or occupational therapist.  Keep all follow-up visits as told by your doctor. This is important. Contact a doctor if:  You have new symptoms.  Medicine does not help your pain.  Your symptoms get worse. Get help right away if:  You have  very bad numbness or tingling in your wrist or hand. Summary  Carpal tunnel syndrome is a condition that causes pain in your hand and arm.  It is often caused by repeated wrist motions.  Lifestyle changes and medicines are used to treat this problem. Surgery may help in very bad cases.  Follow your doctor's instructions about wearing a splint, resting your wrist, keeping follow-up visits, and calling for help. This information is not intended to replace advice given to you by your health care provider. Make sure you discuss any questions you have with your health care provider. Document Released: 11/14/2011 Document Revised: 04/03/2018 Document Reviewed: 04/03/2018 Elsevier Patient Education  2020 [employer]

## 2019-08-17 MED FILL — metFORMIN HCL 500 MG TABS: 500 | 30 days supply | Qty: 30 | Fill #1

## 2019-08-17 MED FILL — LOSARTAN-HCTZ 100-25 MG TAB: 100-25 | 30 days supply | Qty: 30 | Fill #1

## 2019-10-04 MED FILL — metFORMIN HCL 500 MG TABS: 500 | 30 days supply | Qty: 30 | Fill #2

## 2019-10-04 MED FILL — LOSARTAN-HCTZ 100-25 MG TAB: 100-25 | 30 days supply | Qty: 30 | Fill #2

## 2019-11-08 ENCOUNTER — Ambulatory Visit: Payer: BC Managed Care – PPO | Attending: Internal Medicine | Admitting: Internal Medicine

## 2019-11-08 ENCOUNTER — Other Ambulatory Visit: Payer: Self-pay

## 2019-11-08 ENCOUNTER — Encounter: Payer: Self-pay | Admitting: Internal Medicine

## 2019-11-08 DIAGNOSIS — M65332 Trigger finger, left middle finger: Secondary | ICD-10-CM | POA: Diagnosis not present

## 2019-11-08 DIAGNOSIS — M170 Bilateral primary osteoarthritis of knee: Secondary | ICD-10-CM

## 2019-11-08 DIAGNOSIS — I10 Essential (primary) hypertension: Secondary | ICD-10-CM

## 2019-11-08 DIAGNOSIS — E119 Type 2 diabetes mellitus without complications: Secondary | ICD-10-CM

## 2019-11-08 DIAGNOSIS — Z23 Encounter for immunization: Secondary | ICD-10-CM

## 2019-11-08 MED ORDER — LOSARTAN POTASSIUM-HCTZ 100-25 MG PO TABS: 1.0000 | ORAL_TABLET | Freq: Every day | ORAL | 2 refills | Status: DC

## 2019-11-08 MED ORDER — HYDRALAZINE HCL 50 MG PO TABS: 75.0000 mg | ORAL_TABLET | Freq: Two times a day (BID) | ORAL | 5 refills | Status: DC

## 2019-11-08 MED ORDER — METFORMIN HCL 500 MG PO TABS: 500.0000 mg | ORAL_TABLET | Freq: Every day | ORAL | 3 refills | Status: DC

## 2019-11-08 MED FILL — LOSARTAN-HCTZ 100-25 MG TAB: 100-25 | 30 days supply | Qty: 30 | Fill #0

## 2019-11-08 MED FILL — metFORMIN HCL 500 MG TABS: 500 | 90 days supply | Qty: 90 | Fill #0

## 2019-11-08 MED FILL — hydrALAZINE HCL 50 MG TABS: 50 | 30 days supply | Qty: 90 | Fill #0

## 2019-11-08 NOTE — Progress Notes (Signed)
Virtual Visit via Telephone Note Due to current restrictions/limitations of in-office visits due to the COVID-19 pandemic, this scheduled clinical appointment was converted to a telehealth visit  I connected with Linda Cooper on 11/08/19 at 9:07 a.m verified that I am speaking with the correct person using two identifiers. I am in my office.  The patient is at home.  Only the patient and myself participated in this encounter.  I discussed the limitations, risks, security and privacy concerns of performing an evaluation and management service by telephone and the availability of in person appointments. I also discussed with the patient that there may be a patient responsible charge related to this service. The patient expressed understanding and agreed to proceed.   History of Present Illness: Hx of HTN, DM, obesity and OA knees  HYPERTENSION Currently taking: see medication list Med Adherence: [x] Yes but a lot of times she forgets to take the evening dose of Hydralazine   [] No Medication side effects:  Felt dizzy and lightheaded the first time she took the increase dose of 75 mg of Hydralazine.  No further episode since then Adherence with salt restriction: [x] Yes    [] No Home Monitoring?: [x] Yes    [] No Monitoring Frequency:  Once a wk Home BP results range:  140s/80s SOB? [x] Yes - when I have to walk a great distance or if I'm carrying something   Chest Pain?: [] Yes    [x] No Leg swelling?: [] Yes    [x] No Headaches?: [] Yes    [x] No Dizziness? [] Yes    [x] No Comments: had some muscle spasms that got better with water intake  DIABETES TYPE 2/Obesity Last A1C:   Results for orders placed or performed in visit on 07/09/19  POCT glucose (manual entry)  Result Value Ref Range   POC Glucose 123 (A) 70 - 99 mg/dl    Med Adherence:  [x] Yes    [] No Medication side effects:  [] Yes    [x] No Home Monitoring?  [x] Yes but not every day    [] No Home glucose results  range: 107, 104, 114, 125 Diet Adherence: [x] Yes  - eating more fruits and veggies, less meat Exercise:  Still limited by her knees.  LT knee has been more painful not sure if it is the weather.  Does a lot of walking on her jobs.  Takes Aleve every day.  More painful when she does not take it.  Does not want anything narcotic because makes her drowsy.  She plans [facility] Hypoglycemic episodes?: [] Yes    [x] No Numbness of the feet? [] Yes    [x] No Retinopathy hx? [] Yes    [] No Last eye exam:  Referred to Ophthalmology on last visit.  They did call but she did not follow through because she has been working a lot Comments:   3rd finger on LT hand locks in place intermittently x 3 wks.  Not painful but has to use other hand to straighten the finger back out.    HM:  Due for flu vaccine.  Agreeable to have it done Outpatient Encounter Medications as of 11/08/2019  Medication Sig  . albuterol (PROVENTIL HFA;VENTOLIN HFA) 108 (90 Base) MCG/ACT inhaler Inhale 2 puffs into the lungs every 4 (four) hours as needed for wheezing.  . Blood Glucose Monitoring Suppl (CONTOUR NEXT MONITOR) w/Device KIT  1 Units by Does not apply route daily.  Marland Kitchen glucose blood (CONTOUR NEXT TEST) test strip Use as instructed once daily  . hydrALAZINE (APRESOLINE) 50 MG tablet Take 1.5 tablets (75 mg total) by mouth 2 (two) times daily.  Marland Kitchen losartan-hydrochlorothiazide (HYZAAR) 100-25 MG tablet TAKE 1 TABLET BY MOUTH DAILY.  . metFORMIN (GLUCOPHAGE) 500 MG tablet Take 1 tablet (500 mg total) by mouth daily with breakfast.  . MICROLET LANCETS MISC Use as directed once daily  . Multiple Vitamins-Minerals (MULTIVITAMIN WITH MINERALS) tablet Take 1 tablet by mouth daily.  . naproxen (NAPROSYN) 500 MG tablet Take 1 tablet (500 mg total) by mouth 2 (two) times daily.  . NON FORMULARY SEGA PRO BLADDER HEALTH 100 MG DAILY  . Vitamin D, Ergocalciferol, (DRISDOL) 1.25 MG (50000 UT) CAPS capsule Take  1 capsule (50,000 Units total) by mouth every 7 (seven) days.   No facility-administered encounter medications on file as of 11/08/2019.       Observations/Objective: No direct observation done  Lab Results  Component Value Date   WBC 7.9 12/29/2018   HGB 11.8 12/29/2018   HCT 36.2 12/29/2018   MCV 78 (L) 12/29/2018   PLT 341 03/02/2018     Chemistry      Component Value Date/Time   NA 141 12/29/2018 1546   K 4.1 12/29/2018 1546   CL 100 12/29/2018 1546   CO2 23 12/29/2018 1546   BUN 18 12/29/2018 1546   CREATININE 0.72 12/29/2018 1546   CREATININE 0.77 12/26/2014 1153      Component Value Date/Time   CALCIUM 9.1 12/29/2018 1546   ALKPHOS 85 12/29/2018 1546   AST 17 12/29/2018 1546   ALT 17 12/29/2018 1546   BILITOT 0.7 12/29/2018 1546     Lab Results  Component Value Date   CHOL 137 12/29/2018   HDL 57 12/29/2018   LDLCALC 64 12/29/2018   TRIG 82 12/29/2018   CHOLHDL 2.1 09/24/2018     Assessment and Plan: 1. Diabetes mellitus without complication ([place]) Commended her on changes in her eating habits.  Encouraged her to keep up the good work. Reminded and encouraged her to get her eye exam done.  She states that she will call the orthopedics back and schedule - CBC; Future - Comprehensive metabolic panel; Future - Lipid panel; Future - Hemoglobin A1c; Future - metFORMIN (GLUCOPHAGE) 500 MG tablet; Take 1 tablet (500 mg total) by mouth daily with breakfast.  Dispense: 90 tablet; Refill: 3  2. Morbid obesity ([place]) See #1 above  3. Essential hypertension Reported home blood pressure readings not at goal. She will work on remembering to take the evening dose of the hydralazine.  She will let me know if she has any further episodes of dizziness - losartan-hydrochlorothiazide (HYZAAR) 100-25 MG tablet; Take 1 tablet by mouth daily.  Dispense: 30 tablet; Refill: 2 - hydrALAZINE (APRESOLINE) 50 MG tablet; Take 1.5 tablets (75 mg total) by mouth 2 (two) times  daily.  Dispense: 90 tablet; Refill: 5  4. Primary osteoarthritis of both knees Weight loss encouraged. She declines my referral to orthopedics for steroid injection.  She states that she will call and schedule when she is ready. Try Voltaren gel over-the-counter.  5. Trigger middle finger of left hand Recommend referral to orthopedics for injection.  Patient states that she will schedule herself.  6. Need for influenza vaccination She will get flu shot tomorrow when she comes to pick up her medications from the pharmacy   Follow Up  Instructions: 4 mths   I discussed the assessment and treatment plan with the patient. The patient was provided an opportunity to ask questions and all were answered. The patient agreed with the plan and demonstrated an understanding of the instructions.   The patient was advised to call back or seek an in-person evaluation if the symptoms worsen or if the condition fails to improve as anticipated.  I provided 21 minutes of non-face-to-face time during this encounter.   Karle Plumber, MD

## 2019-11-11 ENCOUNTER — Ambulatory Visit: Payer: BC Managed Care – PPO | Attending: Internal Medicine

## 2019-11-11 ENCOUNTER — Other Ambulatory Visit: Payer: Self-pay

## 2019-11-11 DIAGNOSIS — Z23 Encounter for immunization: Secondary | ICD-10-CM

## 2019-11-11 DIAGNOSIS — E119 Type 2 diabetes mellitus without complications: Secondary | ICD-10-CM | POA: Diagnosis not present

## 2019-11-12 LAB — COMPREHENSIVE METABOLIC PANEL
ALT: 14 IU/L (ref 0–32)
AST: 15 IU/L (ref 0–40)
Albumin/Globulin Ratio: 1.4 (ref 1.2–2.2)
Albumin: 4 g/dL (ref 3.8–4.9)
Alkaline Phosphatase: 94 IU/L (ref 39–117)
BUN/Creatinine Ratio: 26 (ref 12–28)
BUN: 20 mg/dL (ref 8–27)
Bilirubin Total: 0.9 mg/dL (ref 0.0–1.2)
CO2: 24 mmol/L (ref 20–29)
Calcium: 9.4 mg/dL (ref 8.7–10.3)
Chloride: 101 mmol/L (ref 96–106)
Creatinine, Ser: 0.78 mg/dL (ref 0.57–1.00)
GFR calc Af Amer: 96 mL/min/{1.73_m2} (ref >59)
GFR calc non Af Amer: 83 mL/min/{1.73_m2} (ref >59)
Globulin, Total: 2.8 g/dL (ref 1.5–4.5)
Glucose: 112 mg/dL — ABNORMAL HIGH (ref 65–99)
Potassium: 4.3 mmol/L (ref 3.5–5.2)
Sodium: 140 mmol/L (ref 134–144)
Total Protein: 6.8 g/dL (ref 6.0–8.5)

## 2019-11-12 LAB — CBC
Hematocrit: 36.3 % (ref 34.0–46.6)
Hemoglobin: 11.9 g/dL (ref 11.1–15.9)
MCH: 25.7 pg — ABNORMAL LOW (ref 26.6–33.0)
MCHC: 32.8 g/dL (ref 31.5–35.7)
MCV: 78 fL — ABNORMAL LOW (ref 79–97)
Platelets: 400 10*3/uL (ref 150–450)
RBC: 4.63 x10E6/uL (ref 3.77–5.28)
RDW: 15.8 % — ABNORMAL HIGH (ref 11.7–15.4)
WBC: 7.4 10*3/uL (ref 3.4–10.8)

## 2019-11-12 LAB — LIPID PANEL
Chol/HDL Ratio: 1.9 ratio (ref 0.0–4.4)
Cholesterol, Total: 142 mg/dL (ref 100–199)
HDL: 76 mg/dL (ref >39)
LDL Chol Calc (NIH): 51 mg/dL (ref 0–99)
Triglycerides: 78 mg/dL (ref 0–149)
VLDL Cholesterol Cal: 15 mg/dL (ref 5–40)

## 2019-11-12 LAB — HEMOGLOBIN A1C
Est. average glucose Bld gHb Est-mCnc: 134 mg/dL
Hgb A1c MFr Bld: 6.3 % — ABNORMAL HIGH (ref 4.8–5.6)

## 2019-12-13 MED FILL — LOSARTAN-HCTZ 100-25 MG TAB: 100-25 | 30 days supply | Qty: 30 | Fill #1

## 2020-01-12 MED FILL — LOSARTAN-HCTZ 100-25 MG TAB: 100-25 | 30 days supply | Qty: 30 | Fill #2

## 2020-01-20 ENCOUNTER — Ambulatory Visit: Payer: BC Managed Care – PPO

## 2020-01-21 ENCOUNTER — Ambulatory Visit: Payer: BC Managed Care – PPO | Attending: Internal Medicine

## 2020-01-21 DIAGNOSIS — Z23 Encounter for immunization: Secondary | ICD-10-CM | POA: Insufficient documentation

## 2020-01-21 NOTE — Progress Notes (Signed)
   Covid-19 Vaccination Clinic  Name:  Linda Cooper    MRN: [id number] DOB: 06/26/1961  01/21/2020  [name] was observed post Covid-19 immunization for 15 minutes without incidence. She was provided with Vaccine Information Sheet and instruction to access the V-Safe system.   [name] was instructed to call 911 with any severe reactions post vaccine: Marland Kitchen Difficulty breathing  . Swelling of your face and throat  . A fast heartbeat  . A bad rash all over your body  . Dizziness and weakness    Immunizations Administered    Name Date Dose VIS Date Route   Pfizer COVID-19 Vaccine 01/21/2020  3:14 PM 0.3 mL 11/19/2019 Intramuscular   Manufacturer: [employer], [employer]   Lot: [id number]   NDC: [address]-3

## 2020-02-02 ENCOUNTER — Ambulatory Visit: Payer: BC Managed Care – PPO | Attending: Internal Medicine

## 2020-02-02 ENCOUNTER — Other Ambulatory Visit: Payer: Self-pay

## 2020-02-02 DIAGNOSIS — Z111 Encounter for screening for respiratory tuberculosis: Secondary | ICD-10-CM | POA: Diagnosis not present

## 2020-02-02 NOTE — Progress Notes (Signed)
Tuberculin skin test applied to right ventral forearm.  Patient informed to schedule appt for nurse visit in 48-72 hours to have site read.  Harle Stanford RMA

## 2020-02-04 ENCOUNTER — Ambulatory Visit: Payer: BC Managed Care – PPO | Attending: Internal Medicine

## 2020-02-04 ENCOUNTER — Other Ambulatory Visit: Payer: Self-pay

## 2020-02-04 DIAGNOSIS — Z111 Encounter for screening for respiratory tuberculosis: Secondary | ICD-10-CM

## 2020-02-04 LAB — TB SKIN TEST
Induration: 0 mm
TB Skin Test: NEGATIVE

## 2020-02-04 NOTE — Progress Notes (Signed)
Ptis here for PPD reading    PPD read today / Test read Negative/ 0 Induration/ Letter given to patient/

## 2020-02-14 ENCOUNTER — Other Ambulatory Visit: Payer: Self-pay | Admitting: Internal Medicine

## 2020-02-14 ENCOUNTER — Ambulatory Visit: Payer: BC Managed Care – PPO

## 2020-02-14 DIAGNOSIS — I10 Essential (primary) hypertension: Secondary | ICD-10-CM

## 2020-02-14 MED FILL — metFORMIN HCL 500 MG TABS: 500 | 90 days supply | Qty: 90 | Fill #1

## 2020-02-15 MED FILL — LOSARTAN-HCTZ 100-25 MG TAB: 100-25 | 30 days supply | Qty: 30 | Fill #0

## 2020-02-23 ENCOUNTER — Ambulatory Visit: Payer: Self-pay | Attending: Internal Medicine

## 2020-02-23 DIAGNOSIS — Z23 Encounter for immunization: Secondary | ICD-10-CM

## 2020-02-23 NOTE — Progress Notes (Signed)
   Covid-19 Vaccination Clinic  Name:  Linda Cooper    MRN: [id number] DOB: 1961/09/07  02/23/2020  [name] was observed post Covid-19 immunization for 15 minutes without incident. She was provided with Vaccine Information Sheet and instruction to access the V-Safe system.   [name] was instructed to call 911 with any severe reactions post vaccine: Marland Kitchen Difficulty breathing  . Swelling of face and throat  . A fast heartbeat  . A bad rash all over body  . Dizziness and weakness   Immunizations Administered    Name Date Dose VIS Date Route   Pfizer COVID-19 Vaccine 02/23/2020  3:41 PM 0.3 mL 11/19/2019 Intramuscular   Manufacturer: [employer], [employer]   Lot: [id number]   NDC: [address]-1

## 2020-03-10 ENCOUNTER — Ambulatory Visit: Payer: BC Managed Care – PPO | Admitting: Internal Medicine

## 2020-03-21 MED FILL — LOSARTAN-HCTZ 100-25 MG TAB: 100-25 | 30 days supply | Qty: 30 | Fill #1

## 2020-04-17 ENCOUNTER — Other Ambulatory Visit: Payer: Self-pay | Admitting: Internal Medicine

## 2020-04-17 DIAGNOSIS — I10 Essential (primary) hypertension: Secondary | ICD-10-CM

## 2020-05-24 MED FILL — METFORMIN HCL 500 MG TABS: 500 | 90 days supply | Qty: 90 | Fill #2

## 2020-05-24 MED FILL — hydrALAZINE HCL 50 MG TABS: 50 | 30 days supply | Qty: 90 | Fill #2

## 2020-05-31 ENCOUNTER — Other Ambulatory Visit: Payer: Self-pay | Admitting: Internal Medicine

## 2020-05-31 DIAGNOSIS — I10 Essential (primary) hypertension: Secondary | ICD-10-CM

## 2020-06-02 ENCOUNTER — Telehealth: Payer: Self-pay | Admitting: Internal Medicine

## 2020-06-02 ENCOUNTER — Other Ambulatory Visit: Payer: Self-pay | Admitting: Pharmacist

## 2020-06-02 DIAGNOSIS — I10 Essential (primary) hypertension: Secondary | ICD-10-CM

## 2020-06-02 MED ORDER — LOSARTAN POTASSIUM-HCTZ 100-25 MG PO TABS: 1.0000 | ORAL_TABLET | Freq: Every day | ORAL | 0 refills | Status: DC

## 2020-06-02 MED FILL — LOSARTAN-HCTZ 100-25 MG TAB: 100-25 | 30 days supply | Qty: 30 | Fill #0

## 2020-06-14 ENCOUNTER — Ambulatory Visit (HOSPITAL_COMMUNITY): Payer: BLUE CROSS/BLUE SHIELD

## 2020-06-14 ENCOUNTER — Encounter (HOSPITAL_COMMUNITY): Payer: Self-pay

## 2020-06-14 ENCOUNTER — Ambulatory Visit (INDEPENDENT_AMBULATORY_CARE_PROVIDER_SITE_OTHER): Payer: BLUE CROSS/BLUE SHIELD

## 2020-06-14 ENCOUNTER — Other Ambulatory Visit: Payer: Self-pay

## 2020-06-14 ENCOUNTER — Ambulatory Visit (HOSPITAL_COMMUNITY)
Admission: EM | Admit: 2020-06-14 | Discharge: 2020-06-14 | Disposition: A | Discharge status: Home / Self Care | Payer: BLUE CROSS/BLUE SHIELD | Attending: Family Medicine | Admitting: Family Medicine

## 2020-06-14 DIAGNOSIS — M1711 Unilateral primary osteoarthritis, right knee: Secondary | ICD-10-CM

## 2020-06-14 DIAGNOSIS — M25562 Pain in left knee: Secondary | ICD-10-CM

## 2020-06-14 DIAGNOSIS — M1712 Unilateral primary osteoarthritis, left knee: Secondary | ICD-10-CM | POA: Diagnosis not present

## 2020-06-14 MED ORDER — METHYLPREDNISOLONE ACETATE 80 MG/ML IJ SUSP
INTRAMUSCULAR | Status: AC
  Filled 2020-06-14: qty 1

## 2020-06-14 NOTE — Discharge Instructions (Signed)
Ice to knee Consider orthopedic consult

## 2020-06-14 NOTE — ED Provider Notes (Signed)
[place]    CSN: [id number] Arrival date & time: 06/14/20  1451      History   Chief Complaint Chief Complaint  Patient presents with  . Knee Pain    HPI Linda Cooper is a 59 y.o. female.   HPI  Patient has known end-stage osteoarthritis of both knees She works as an Therapist, sports, 2 jobs, she states that she sits and stands as needed and sometimes has to walk on uneven surfaces She is having progressively more pain every time Periodically will have cortisone injections Takes Aleve twice a day Does not have an orthopedist She stood up yesterday and she felt her knee "shift".  Her left knee became immediately painful.  She states now when she puts weight on it she feels like it is going to give out.  More painful than usual.  She can hardly walk Was unable to go to work today   Past Medical History:  Diagnosis Date  . 'light-for-dates' infant with signs of fetal malnutrition   . Anemia   . Arthritis   . Asthma   . Diabetes mellitus without complication ([place]) Dx 1638  . GERD (gastroesophageal reflux disease)   . Hypertension Dx 1990  . Knee pain, bilateral   . Obesity   . Rheumatic fever childhood    has intermittent murmur and hand arthritis     Patient Active Problem List   Diagnosis Date Noted  . Trigger middle finger of left hand 11/08/2019  . Carpal tunnel syndrome of left wrist 07/09/2019  . BMI 50.0-59.9, adult ([place]) 10/16/2018  . Morbid obesity ([place]) 11/11/2014  . PCOS (polycystic ovarian syndrome) 11/10/2014  . Degenerative arthritis of knee, bilateral 11/10/2014  . Diabetes mellitus without complication ([place])   . Hypertension     Past Surgical History:  Procedure Laterality Date  . ABDOMINAL HYSTERECTOMY  2006   . HERNIA REPAIR  4536    Umbilical     OB History    Gravida  1   Para  1   Term      Preterm      AB      Living        SAB      TAB      Ectopic      Multiple      Live Births               Home  Medications    Prior to Admission medications   Medication Sig Start Date End Date Taking? Authorizing Provider  albuterol (PROVENTIL HFA;VENTOLIN HFA) 108 (90 Base) MCG/ACT inhaler Inhale 2 puffs into the lungs every 4 (four) hours as needed for wheezing. 05/01/17   Argentina Donovan, PA-C  Blood Glucose Monitoring Suppl (CONTOUR NEXT MONITOR) w/Device KIT 1 Units by Does not apply route daily. 09/28/18   Ladell Pier, MD  glucose blood (CONTOUR NEXT TEST) test strip Use as instructed once daily 09/28/18   Ladell Pier, MD  hydrALAZINE (APRESOLINE) 50 MG tablet Take 1.5 tablets (75 mg total) by mouth 2 (two) times daily. 11/08/19   Ladell Pier, MD  losartan-hydrochlorothiazide (HYZAAR) 100-25 MG tablet Take 1 tablet by mouth daily. Must have office visit for refills 06/02/20   Ladell Pier, MD  metFORMIN (GLUCOPHAGE) 500 MG tablet Take 1 tablet (500 mg total) by mouth daily with breakfast. 11/08/19   Ladell Pier, MD  MICROLET LANCETS MISC Use as directed once daily 09/28/18  Ladell Pier, MD  Multiple Vitamins-Minerals (MULTIVITAMIN WITH MINERALS) tablet Take 1 tablet by mouth daily.    [provider]  naproxen (NAPROSYN) 500 MG tablet Take 1 tablet (500 mg total) by mouth 2 (two) times daily. 01/18/18   Tereasa Coop, PA-C  NON FORMULARY SEGA PRO BLADDER HEALTH 100 MG DAILY    [provider]  Vitamin D, Ergocalciferol, (DRISDOL) 1.25 MG (50000 UT) CAPS capsule Take 1 capsule (50,000 Units total) by mouth every 7 (seven) days. 01/12/19   Eber Jones, MD    Family History Family History  Problem Relation Age of Onset  . Hypertension Mother   . Diabetes Mother   . Stroke Mother   . Heart disease Mother   . Obesity Mother   . Hypertension Father   . Diabetes Father   . Stroke Father   . Obesity Father   . Heart disease Father   . Hyperlipidemia Father   . Diabetes Brother   . Hypertension Brother   . Gout Brother   .  Heart disease Sister   . Breast cancer Cousin   . Cancer Neg Hx     Social History Social History   Tobacco Use  . Smoking status: Never Smoker  . Smokeless tobacco: Never Used  Vaping Use  . Vaping Use: Never used  Substance Use Topics  . Alcohol use: No  . Drug use: No     Allergies   Ace inhibitors and Erythromycin base   Review of Systems Review of Systems  Musculoskeletal: Positive for arthralgias, gait problem and joint swelling.     Physical Exam Triage Vital Signs ED Triage Vitals  Enc Vitals Group     BP 06/14/20 1552 (!) 155/97     Pulse Rate 06/14/20 1552 90     Resp 06/14/20 1552 17     Temp 06/14/20 1552 98.6 F (37 C)     Temp Source 06/14/20 1552 Oral     SpO2 06/14/20 1552 96 %     Weight --      Height --      Head Circumference --      Peak Flow --      Pain Score 06/14/20 1550 8     Pain Loc --      Pain Edu? --      Excl. in [place]? --    No data found.  Updated Vital Signs BP (!) 155/97 (BP Location: Right Arm)   Pulse 90   Temp 98.6 F (37 C) (Oral)   Resp 17   SpO2 96%       Physical Exam Constitutional:      General: She is not in acute distress.    Appearance: She is well-developed. She is obese.     Comments: Pleasant cooperative  HENT:     Head: Normocephalic and atraumatic.     Nose:     Comments: Mask is in place Eyes:     Conjunctiva/sclera: Conjunctivae normal.     Pupils: Pupils are equal, round, and reactive to light.  Cardiovascular:     Rate and Rhythm: Normal rate.  Pulmonary:     Effort: Pulmonary effort is normal. No respiratory distress.  Musculoskeletal:        General: Normal range of motion.     Cervical back: Normal range of motion.     Comments: Knee has tenderness over anterior mid patella.  Tenderness around the medial joint line.  Less tenderness in  the lateral joint line.  Can flex 90 degrees and extend fully.  Skin:    General: Skin is warm and dry.  Neurological:     Mental Status: She is  alert.     Gait: Gait abnormal.     Comments: Very antalgic gait, small steps  Psychiatric:        Mood and Affect: Mood normal.        Behavior: Behavior normal.      UC Treatments / Results  Labs (all labs ordered are listed, but only abnormal results are displayed) Labs Reviewed - No data to display  EKG   Radiology DG Knee Complete 4 Views Left  Result Date: 06/14/2020 CLINICAL DATA:  59 year old female with left knee pain. EXAM: LEFT KNEE - COMPLETE 4+ VIEW COMPARISON:  Left knee radiograph dated 10/27/2018. FINDINGS: There is no acute fracture or dislocation. The bones are osteopenic. There is severe osteoarthritic changes of the knee with tricompartmental narrowing and spurring. There is near complete loss of cartilage in the medial compartment with near bone-on-bone contact. No significant joint effusion. The soft tissues are unremarkable. IMPRESSION: 1. No acute fracture or dislocation. 2. Severe osteoarthritic changes most prominent involving the medial compartment. Electronically Signed   By: Anner Crete M.D.   On: 06/14/2020 17:45    Procedures Join Aspiration/Injection  Date/Time: 06/14/2020 8:43 PM Performed by: Raylene Everts, MD Authorized by: Raylene Everts, MD   Consent:    Consent obtained:  Verbal   Consent given by:  Patient   Risks discussed:  Pain   Alternatives discussed:  Alternative treatment Location:    Location:  Knee Anesthesia (see MAR for exact dosages):    Anesthesia method:  Local infiltration   Local anesthetic:  Lidocaine 1% w/o epi Procedure details:    Needle gauge:  22 G   Approach:  Lateral   Steroid injected: yes     Specimen collected: no   Post-procedure details:    Dressing:  Adhesive bandage   Patient tolerance of procedure:  Tolerated well, no immediate complications Comments:     80 mg of Depo-Medrol with 3 cc of 1% lidocaine was injected via a lateral approach into the left knee.  Tolerated well   (including  critical care time)  Medications Ordered in UC Medications - No data to display  Initial Impression / Assessment and Plan / UC Course  I have reviewed the triage vital signs and the nursing notes.  Pertinent labs & imaging results that were available during my care of the patient were reviewed by me and considered in my medical decision making (see chart for details).     Explained to the patient that she has bone-on-bone arthritis.  I made a picture of her knee and showed it to her.  It will be longer for she will need total knee arthroplasty.  I gave her the name and number of a physician who does this.  Patient requests an injection.  I told her I am happy to do this for her, however, the more arthritis is in the joint, the less affect 1 can expect from a cortisone shot.  It will not help bone-on-bone pain, it is an anti-inflammatory.  Patient voices understanding and wishes the injection Final Clinical Impressions(s) / UC Diagnoses   Final diagnoses:  Acute pain of left knee  Primary osteoarthritis of right knee     Discharge Instructions     Ice to knee Consider orthopedic consult  ED Prescriptions    None     PDMP not reviewed this encounter.   Raylene Everts, MD 06/14/20 2044

## 2020-06-14 NOTE — ED Triage Notes (Signed)
Pt presents with left knee pain since Monday; pt states she stood up at work and felt something shift in her knee followed by a progression in pain as she went on to use it more.

## 2020-06-22 ENCOUNTER — Other Ambulatory Visit: Payer: Self-pay

## 2020-06-22 ENCOUNTER — Encounter: Payer: Self-pay | Admitting: Internal Medicine

## 2020-06-22 ENCOUNTER — Ambulatory Visit: Payer: BLUE CROSS/BLUE SHIELD | Attending: Internal Medicine | Admitting: Internal Medicine

## 2020-06-22 DIAGNOSIS — Z2821 Immunization not carried out because of patient refusal: Secondary | ICD-10-CM

## 2020-06-22 DIAGNOSIS — I10 Essential (primary) hypertension: Secondary | ICD-10-CM

## 2020-06-22 DIAGNOSIS — E1169 Type 2 diabetes mellitus with other specified complication: Secondary | ICD-10-CM | POA: Diagnosis not present

## 2020-06-22 DIAGNOSIS — M170 Bilateral primary osteoarthritis of knee: Secondary | ICD-10-CM

## 2020-06-22 DIAGNOSIS — Z1211 Encounter for screening for malignant neoplasm of colon: Secondary | ICD-10-CM

## 2020-06-22 DIAGNOSIS — E1165 Type 2 diabetes mellitus with hyperglycemia: Secondary | ICD-10-CM | POA: Insufficient documentation

## 2020-06-22 LAB — POCT GLYCOSYLATED HEMOGLOBIN (HGB A1C): HbA1c, POC (prediabetic range): 6.1 % (ref 5.7–6.4)

## 2020-06-22 LAB — GLUCOSE, POCT (MANUAL RESULT ENTRY): POC Glucose: 136 mg/dl — AB (ref 70–99)

## 2020-06-22 NOTE — Patient Instructions (Signed)

## 2020-06-22 NOTE — Progress Notes (Signed)
Subjective:  Patient ID: Linda Cooper, female    DOB: 1961/04/22  Age: 59 y.o. MRN: [id number]  CC: Diabetes and Hypertension   HPI [name] presents for chronic ds management Hx of HTN, DM, obesity and OA knees  LT Knee Pain:   At work 06/12/20, she stood up and something shifted LT knee.  Iced it and stayed home for 2 days. No better so she was seen at [facility].  X-ray revealed severe OA changes.  Given steroid inj and referred to ortho.  She knows that she needs to see Ortho and needs a total knee replacement.  She has the number to call when she is ready. Plans to take a 3rd partime job and save up 6 mths of living expenses first.  This will allow her to cover bills during recovery as she does not have disability insurance.  DIABETES TYPE 2/Obesity Last A1C:   Results for orders placed or performed in visit on 06/22/20  POCT glucose (manual entry)  Result Value Ref Range   POC Glucose 136 (A) 70 - 99 mg/dl  POCT glycosylated hemoglobin (Hb A1C)  Result Value Ref Range   Hemoglobin A1C     HbA1c POC (<> result, manual entry)     HbA1c, POC (prediabetic range) 6.1 5.7 - 6.4 %   HbA1c, POC (controlled diabetic range)      Med Adherence:  [x]  Yes on Metformin    []  No Medication side effects:  []  Yes    [x]  No Home Monitoring?  []  Yes    [x]  No Home glucose results range: Diet Adherence: admits that eaating habits not the best.  Doing take out a lot due to work schedule.  Was over 300 lbs few mths ago. Exercise: []  Yes    [x]  No, not able to do much due to end-stage OA knees Hypoglycemic episodes?: []  Yes    [x]  No Numbness of the feet? []  Yes    [x]  No Retinopathy hx? []  Yes    []  No Last eye exam:  Over due for eye exam.  She now has insurance.  She plans to call and schedule the appointment herself. Comments:   HYPERTENSION Currently taking: see medication list.  She is on Cozaar/HCTZ and hydralazine. Med Adherence: [x]  Yes    []  No Medication side effects: []  Yes     [x]  No Adherence with salt restriction: []  Yes    [x]  No.  Admits that she has been eating out a lot lately Home Monitoring?: [x]  Yes    []  No Monitoring Frequency: a few times a wk Home BP results range: 140/80s SOB? [x]  Yes if walking up hill   []  No Chest Pain?: []  Yes    [x]  No Leg swelling?: []  Yes    [x]  No Headaches?: []  Yes    [x]  No Dizziness? [x]  Yes sometimes    []  No Comments:   Past Medical History:  Diagnosis Date  . 'light-for-dates' infant with signs of fetal malnutrition   . Anemia   . Arthritis   . Asthma   . Diabetes mellitus without complication ([place]) Dx 4098  . GERD (gastroesophageal reflux disease)   . Hypertension Dx 1990  . Knee pain, bilateral   . Obesity   . Rheumatic fever childhood    has intermittent murmur and hand arthritis     Past Surgical History:  Procedure Laterality Date  . ABDOMINAL HYSTERECTOMY  2006   . HERNIA REPAIR  2006  Umbilical     Family History  Problem Relation Age of Onset  . Hypertension Mother   . Diabetes Mother   . Stroke Mother   . Heart disease Mother   . Obesity Mother   . Hypertension Father   . Diabetes Father   . Stroke Father   . Obesity Father   . Heart disease Father   . Hyperlipidemia Father   . Diabetes Brother   . Hypertension Brother   . Gout Brother   . Heart disease Sister   . Breast cancer Cousin   . Cancer Neg Hx     Social History   Tobacco Use  . Smoking status: Never Smoker  . Smokeless tobacco: Never Used  Substance Use Topics  . Alcohol use: No    ROS Review of Systems  Objective:   Today's Vitals: BP (!) 155/85   Pulse 88   Resp 16   Wt 298 lb (135.2 kg)   SpO2 97%   BMI 56.31 kg/m   Wt Readings from Last 3 Encounters:  06/22/20 298 lb (135.2 kg)  07/09/19 289 lb 9.6 oz (131.4 kg)  02/12/19 283 lb 12.8 oz (128.7 kg)    Physical Exam General: Older obese female in NAD HEENT: No cervical lymphadenopathy.  No thyromegaly. Chest: Clear to auscultation  bilaterally CVS: Regular rate rhythm.  No gallops or murmurs. Trace lower extremity edema. MSK: Mild to moderate discomfort with attempted passive range of motion of the left knee.  She ambulates unassisted.  A/P 1. Type 2 diabetes mellitus with morbid obesity ([place]) At goal. Discussed and encourage healthy eating habits.  Encouraged her to cut back on eating out and try to cook more.  Discussed the importance of weight loss which will help decrease the pain in the knees.  Encouraged her to call and schedule a high appointment with Dr. Gershon Crane. - POCT glucose (manual entry) - POCT glycosylated hemoglobin (Hb A1C)  2. Morbid obesity ([place]) See #1 above.  Printed information given on healthy eating habits  3. 23-polyvalent pneumococcal polysaccharide vaccine declined This was offered and patient declined.  4. Essential hypertension Not at goal.  Patient declines any changes in medicine or add on's.  She will work on limiting salt in the foods.  5. Primary osteoarthritis of both knees Patient has a plan to save up 6 months of emergency expenses before following up with Ortho to be considered for knee replacement surgery  6. Screening for colon cancer Gust colon cancer screening methods.  She is agreeable to the Cologuard. - Cologuard   Outpatient Encounter Medications as of 06/22/2020  Medication Sig  . albuterol (PROVENTIL HFA;VENTOLIN HFA) 108 (90 Base) MCG/ACT inhaler Inhale 2 puffs into the lungs every 4 (four) hours as needed for wheezing.  . Blood Glucose Monitoring Suppl (CONTOUR NEXT MONITOR) w/Device KIT 1 Units by Does not apply route daily.  Marland Kitchen glucose blood (CONTOUR NEXT TEST) test strip Use as instructed once daily  . hydrALAZINE (APRESOLINE) 50 MG tablet Take 1.5 tablets (75 mg total) by mouth 2 (two) times daily.  Marland Kitchen losartan-hydrochlorothiazide (HYZAAR) 100-25 MG tablet Take 1 tablet by mouth daily. Must have office visit for refills  . metFORMIN (GLUCOPHAGE) 500 MG tablet  Take 1 tablet (500 mg total) by mouth daily with breakfast.  . MICROLET LANCETS MISC Use as directed once daily  . Multiple Vitamins-Minerals (MULTIVITAMIN WITH MINERALS) tablet Take 1 tablet by mouth daily.  . naproxen (NAPROSYN) 500 MG tablet Take 1 tablet (500  mg total) by mouth 2 (two) times daily.  . NON FORMULARY SEGA PRO BLADDER HEALTH 100 MG DAILY  . Vitamin D, Ergocalciferol, (DRISDOL) 1.25 MG (50000 UT) CAPS capsule Take 1 capsule (50,000 Units total) by mouth every 7 (seven) days.   No facility-administered encounter medications on file as of 06/22/2020.    Follow-up: No follow-ups on file.    Karle Plumber, MD, FACP

## 2020-06-28 ENCOUNTER — Encounter: Payer: Self-pay | Admitting: Internal Medicine

## 2020-06-28 ENCOUNTER — Other Ambulatory Visit: Payer: Self-pay | Admitting: Internal Medicine

## 2020-06-28 DIAGNOSIS — I10 Essential (primary) hypertension: Secondary | ICD-10-CM

## 2020-06-28 MED FILL — hydrALAZINE HCL 50 MG TABS: 50 | 30 days supply | Qty: 90 | Fill #3

## 2020-06-28 NOTE — Telephone Encounter (Signed)
Requested medication (s) are due for refill today: yes  Requested medication (s) are on the active medication list: yes  Last refill:  06/02/20  #30  0 refills  Future visit scheduled: yes  Notes to clinic:  Patient seen in office 06/22/20. Labs are from 11/11/19    Requested Prescriptions  Pending Prescriptions Disp Refills   losartan-hydrochlorothiazide (HYZAAR) 100-25 MG tablet [Pharmacy Med Name: LOSARTAN-HCTZ 100-25 MG TAB 100-25 Tablet] 30 tablet 0    Sig: Take 1 tablet by mouth daily. Must have office visit for refills      Cardiovascular: ARB + Diuretic Combos Failed - 06/28/2020  3:35 PM      Failed - K in normal range and within 180 days    Potassium  Date Value Ref Range Status  11/11/2019 4.3 3.5 - 5.2 mmol/L Final          Failed - Na in normal range and within 180 days    Sodium  Date Value Ref Range Status  11/11/2019 140 134 - 144 mmol/L Final          Failed - Cr in normal range and within 180 days    Creat  Date Value Ref Range Status  12/26/2014 0.77 0.50 - 1.10 mg/dL Final   Creatinine, Ser  Date Value Ref Range Status  11/11/2019 0.78 0.57 - 1.00 mg/dL Final   Creatinine, Urine  Date Value Ref Range Status  11/10/2014 28.9 mg/dL Final    Comment:    No reference range established.          Failed - Ca in normal range and within 180 days    Calcium  Date Value Ref Range Status  11/11/2019 9.4 8.7 - 10.3 mg/dL Final          Failed - Last BP in normal range    BP Readings from Last 1 Encounters:  06/22/20 (!) 155/85          Passed - Patient is not pregnant      Passed - Valid encounter within last 6 months    Recent Outpatient Visits           6 days ago Type 2 diabetes mellitus with morbid obesity (HCC)   [facility] Jonah Blue B, MD   7 months ago Diabetes mellitus without complication [facility])   [facility] Marcine Matar, MD   11 months ago Diabetes mellitus  without complication [facility])   [facility] Marcine Matar, MD   1 year ago Pain due to dental caries   [facility] Marcine Matar, MD   1 year ago Diabetes mellitus without complication [facility])   [facility] Marcine Matar, MD       Future Appointments             In 3 months Laural Benes, Binnie Rail, MD [facility]

## 2020-06-29 MED FILL — LOSARTAN-HCTZ 100-25 MG TAB: 100-25 | 90 days supply | Qty: 90 | Fill #0

## 2020-07-03 ENCOUNTER — Encounter: Payer: Self-pay | Admitting: Internal Medicine

## 2020-07-05 ENCOUNTER — Telehealth: Payer: Self-pay | Admitting: Internal Medicine

## 2020-07-05 NOTE — Telephone Encounter (Signed)
Call placed to the patient. She is considering applying for disability as she may need to stop working due to her knee pain. Explained to her that she needs to contact the Optometrist [facility])  to initiate the process. The timeline for approval for disability can be quite long and many times, the initial application is denied.  Instructed her to call this CM if she has questions after contacting [employer].

## 2020-07-05 NOTE — Telephone Encounter (Signed)
Please advise.  Copied from CRM [id number]. Topic: General - Inquiry >> Jul 05, 2020  4:33 PM Leary Roca wrote: Reason for CRM: Pt is needing to speak with a Child psychotherapist or Sports coach . Please reach out to pt

## 2020-09-04 MED FILL — hydrALAZINE HCL 50 MG TABS: 50 | 30 days supply | Qty: 90 | Fill #4

## 2020-09-04 MED FILL — METFORMIN HCL 500 MG TABS: 500 | 90 days supply | Qty: 90 | Fill #3

## 2020-09-11 ENCOUNTER — Telehealth: Payer: Self-pay | Admitting: Internal Medicine

## 2020-09-11 MED ORDER — AMOXICILLIN 500 MG PO CAPS
500.0000 mg | ORAL_CAPSULE | Freq: Three times a day (TID) | ORAL | 0 refills | Status: DC
Start: 2020-09-11 — End: 2020-09-12

## 2020-09-11 NOTE — Telephone Encounter (Signed)
Copied from CRM [id number]. Topic: Quick Communication - See Telephone Encounter >> Sep 11, 2020  8:46 AM Aretta Nip wrote: CRM for notification. See Telephone encounter for: 09/11/20. Pt has severe toothache states Dr Shela Commons had given her the names of some dentist but none take her insurance, states Dr Shela Commons has called in an antibiotic before and she has been in a lot of pain since Sat. Nite, wants cb from nurse at [phone]

## 2020-09-11 NOTE — Telephone Encounter (Signed)
Will forward to pcp

## 2020-09-12 ENCOUNTER — Other Ambulatory Visit: Payer: BC Managed Care – PPO

## 2020-09-12 ENCOUNTER — Other Ambulatory Visit: Payer: Self-pay | Admitting: Pharmacist

## 2020-09-12 DIAGNOSIS — Z20822 Contact with and (suspected) exposure to covid-19: Secondary | ICD-10-CM

## 2020-09-12 MED ORDER — AMOXICILLIN 500 MG PO CAPS: 500.0000 mg | ORAL_CAPSULE | Freq: Three times a day (TID) | ORAL | 0 refills | Status: DC

## 2020-09-12 MED FILL — AMOXICILLIN 500 MG CAPSULE: 500 | 10 days supply | Qty: 30 | Fill #0

## 2020-09-12 NOTE — Telephone Encounter (Signed)
Contacted pt and made aware that rx has been sent to pharmacy

## 2020-09-14 DIAGNOSIS — M25561 Pain in right knee: Secondary | ICD-10-CM | POA: Diagnosis not present

## 2020-09-14 DIAGNOSIS — M1712 Unilateral primary osteoarthritis, left knee: Secondary | ICD-10-CM | POA: Diagnosis not present

## 2020-09-14 LAB — SARS-COV-2, NAA 2 DAY TAT

## 2020-09-14 LAB — NOVEL CORONAVIRUS, NAA: SARS-CoV-2, NAA: NOT DETECTED

## 2020-10-07 ENCOUNTER — Encounter (HOSPITAL_COMMUNITY): Payer: Self-pay

## 2020-10-07 ENCOUNTER — Other Ambulatory Visit: Payer: Self-pay

## 2020-10-07 ENCOUNTER — Ambulatory Visit (HOSPITAL_COMMUNITY)
Admission: EM | Admit: 2020-10-07 | Discharge: 2020-10-07 | Disposition: A | Discharge status: Home / Self Care | Payer: BC Managed Care – PPO | Attending: Family Medicine | Admitting: Family Medicine

## 2020-10-07 DIAGNOSIS — K0889 Other specified disorders of teeth and supporting structures: Secondary | ICD-10-CM | POA: Diagnosis not present

## 2020-10-07 DIAGNOSIS — K047 Periapical abscess without sinus: Secondary | ICD-10-CM | POA: Diagnosis not present

## 2020-10-07 MED ORDER — AMOXICILLIN-POT CLAVULANATE 875-125 MG PO TABS: 1.0000 | ORAL_TABLET | Freq: Two times a day (BID) | ORAL | 0 refills | Status: AC

## 2020-10-07 NOTE — ED Provider Notes (Signed)
[place]   [id number] 10/07/20 Arrival Time: 11  CC: DENTAL pain  SUBJECTIVE:  Linda Cooper is a 59 y.o. female who presents with dental pain x 1 day. Reports broken tooth to L upper jaw. Has tried OTC analgesics without relief. Worse with chewing. Does not see a dentist regularly. Reports similar symptoms in the past. Denies fever, chills, dysphagia, odynophagia, oral or neck swelling, nausea, vomiting, chest pain, SOB.    ROS: As per HPI.  All other pertinent ROS negative.     Past Medical History:  Diagnosis Date  . 'light-for-dates' infant with signs of fetal malnutrition   . Anemia   . Arthritis   . Asthma   . Diabetes mellitus without complication ([place]) Dx 0347  . GERD (gastroesophageal reflux disease)   . Hypertension Dx 1990  . Knee pain, bilateral   . Obesity   . Rheumatic fever childhood    has intermittent murmur and hand arthritis    Past Surgical History:  Procedure Laterality Date  . ABDOMINAL HYSTERECTOMY  2006   . HERNIA REPAIR  4259    Umbilical    Allergies  Allergen Reactions  . Ace Inhibitors Cough  . Erythromycin Base     Cramping in abdomen is reaction/no rash   No current facility-administered medications on file prior to encounter.   Current Outpatient Medications on File Prior to Encounter  Medication Sig Dispense Refill  . albuterol (PROVENTIL HFA;VENTOLIN HFA) 108 (90 Base) MCG/ACT inhaler Inhale 2 puffs into the lungs every 4 (four) hours as needed for wheezing. 1 Inhaler 0  . Blood Glucose Monitoring Suppl (CONTOUR NEXT MONITOR) w/Device KIT 1 Units by Does not apply route daily. 1 kit 0  . glucose blood (CONTOUR NEXT TEST) test strip Use as instructed once daily 100 each 12  . hydrALAZINE (APRESOLINE) 50 MG tablet Take 1.5 tablets (75 mg total) by mouth 2 (two) times daily. 90 tablet 5  . losartan-hydrochlorothiazide (HYZAAR) 100-25 MG tablet Take 1 tablet by mouth daily. 30 tablet 2  . metFORMIN (GLUCOPHAGE) 500 MG  tablet Take 1 tablet (500 mg total) by mouth daily with breakfast. 90 tablet 3  . MICROLET LANCETS MISC Use as directed once daily 100 each 12  . Multiple Vitamins-Minerals (MULTIVITAMIN WITH MINERALS) tablet Take 1 tablet by mouth daily.    . naproxen (NAPROSYN) 500 MG tablet Take 1 tablet (500 mg total) by mouth 2 (two) times daily. 60 tablet 0  . NON FORMULARY SEGA PRO BLADDER HEALTH 100 MG DAILY    . Vitamin D, Ergocalciferol, (DRISDOL) 1.25 MG (50000 UT) CAPS capsule Take 1 capsule (50,000 Units total) by mouth every 7 (seven) days. 4 capsule 0   Social History   Socioeconomic History  . Marital status: Single    Spouse name: Not on file  . Number of children: 1   . Years of education: college   . Highest education level: Not on file  Occupational History  . 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 Smoking status: Never Smoker  . Smokeless tobacco: Never Used  Vaping Use  . Vaping Use: Never used  Substance and Sexual Activity  . Alcohol use: No  . Drug use: No  . Sexual activity: Not on file  Other Topics Concern  . Not on file  Social History Narrative   Lives at home with son.    Son is 7.  She is motivated to lose weight and improve health by the expected death of her father  28-Jan-2017) and the sudden deaths of her cousin (found dead in car one month after death of his wife 03/28/17)  and his wife (died after childbirth February 28, 2017)    Exercise: minimal    Social Determinants of Health   Financial Resource Strain:   . Difficulty of Paying Living Expenses: Not on file  Food Insecurity:   . Worried About Charity fundraiser in the Last Year: Not on file  . Ran Out of Food in the Last Year: Not on file  Transportation Needs:   . Lack of Transportation (Medical): Not on file  . Lack of Transportation (Non-Medical): Not on file  Physical Activity:   . Days of Exercise per Week: Not on file  . Minutes of Exercise per Session:  Not on file  Stress:   . Feeling of Stress : Not on file  Social Connections:   . Frequency of Communication with Friends and Family: Not on file  . Frequency of Social Gatherings with Friends and Family: Not on file  . Attends Religious Services: Not on file  . Active Member of Clubs or Organizations: Not on file  . Attends Archivist Meetings: Not on file  . Marital Status: Not on file  Intimate Partner Violence:   . Fear of Current or Ex-Partner: Not on file  . Emotionally Abused: Not on file  . Physically Abused: Not on file  . Sexually Abused: Not on file   Family History  Problem Relation Age of Onset  . Hypertension Mother   . Diabetes Mother   . Stroke Mother   . Heart disease Mother   . Obesity Mother   . Hypertension Father   . Diabetes Father   . Stroke Father   . Obesity Father   . Heart disease Father   . Hyperlipidemia Father   . Diabetes Brother   . Hypertension Brother   . Gout Brother   . Heart disease Sister   . Breast cancer Cousin   . Cancer Neg Hx     OBJECTIVE:  Vitals:   10/07/20 1737  BP: 134/89  Pulse: (!) 105  Resp: 18  Temp: 98.9 F (37.2 C)  TempSrc: Oral  SpO2: 100%    General appearance: alert; no distress HENT: normocephalic; atraumatic; dentition: fair; poor over left upper gums without areas of fluctuance Neck: supple without LAD Lungs: normal respirations Skin: warm and dry Psychological: alert and cooperative; normal mood and affect  ASSESSMENT & PLAN:  1. Dental infection   2. Pain, dental     Meds ordered this encounter  Medications  . amoxicillin-clavulanate (AUGMENTIN) 875-125 MG tablet    Sig: Take 1 tablet by mouth 2 (two) times daily for 10 days.    Dispense:  20 tablet    Refill:  0    Order Specific Question:   Supervising Provider    Answer:   Chase Picket [[id number]]   Augmentin prescribed Take aleve as needed for pain relief Recommend soft diet until evaluated by dentist Maintain oral  hygiene care Follow up with dentist as soon as possible for further evaluation and treatment  Return or go to the ED if you have any new or worsening symptoms such as fever, chills, difficulty swallowing, painful swallowing, oral or neck swelling, nausea, vomiting, chest pain, SOB.  Reviewed expectations re: course of current medical issues. Questions answered. Outlined signs and symptoms  indicating need for more acute intervention. Patient verbalized understanding. After Visit Summary given.   Faustino Congress, NP 10/07/20 1754

## 2020-10-07 NOTE — Discharge Instructions (Addendum)
I have sent in Augmentin for you to take twice a day for 7 days  Follow up with dentistry

## 2020-10-07 NOTE — ED Triage Notes (Signed)
Pt present left side tooth pain. Symptom started yesterday.

## 2020-10-10 ENCOUNTER — Other Ambulatory Visit: Payer: Self-pay | Admitting: Internal Medicine

## 2020-10-10 DIAGNOSIS — I10 Essential (primary) hypertension: Secondary | ICD-10-CM

## 2020-10-10 MED FILL — LOSARTAN-HCTZ 100-25 MG TAB: 100-25 | 90 days supply | Qty: 90 | Fill #0

## 2020-10-23 ENCOUNTER — Encounter: Payer: Self-pay | Admitting: Internal Medicine

## 2020-10-23 ENCOUNTER — Ambulatory Visit: Payer: BC Managed Care – PPO | Attending: Internal Medicine | Admitting: Internal Medicine

## 2020-10-23 ENCOUNTER — Other Ambulatory Visit: Payer: Self-pay

## 2020-10-23 ENCOUNTER — Ambulatory Visit (HOSPITAL_BASED_OUTPATIENT_CLINIC_OR_DEPARTMENT_OTHER): Payer: BC Managed Care – PPO | Admitting: Pharmacist

## 2020-10-23 DIAGNOSIS — I10 Essential (primary) hypertension: Secondary | ICD-10-CM | POA: Diagnosis not present

## 2020-10-23 DIAGNOSIS — R42 Dizziness and giddiness: Secondary | ICD-10-CM

## 2020-10-23 DIAGNOSIS — E1169 Type 2 diabetes mellitus with other specified complication: Secondary | ICD-10-CM | POA: Diagnosis not present

## 2020-10-23 DIAGNOSIS — Z23 Encounter for immunization: Secondary | ICD-10-CM | POA: Diagnosis not present

## 2020-10-23 DIAGNOSIS — M170 Bilateral primary osteoarthritis of knee: Secondary | ICD-10-CM

## 2020-10-23 DIAGNOSIS — Z1231 Encounter for screening mammogram for malignant neoplasm of breast: Secondary | ICD-10-CM

## 2020-10-23 LAB — GLUCOSE, POCT (MANUAL RESULT ENTRY): POC Glucose: 121 mg/dl — AB (ref 70–99)

## 2020-10-23 NOTE — Patient Instructions (Addendum)
Decrease hydralazine to 75 mg in the morning and 50 mg in the evening.  Go slow with position changes.  Please remember to use the Cologuard test and send it back in.  This is the test for colon cancer screening.  I have submitted referrals for you to get your eye exam and mammogram.  Influenza Virus Vaccine injection (Fluarix) What is this medicine? INFLUENZA VIRUS VACCINE (in floo EN zuh VAHY ruhs vak SEEN) helps to reduce the risk of getting influenza also known as the flu. This medicine may be used for other purposes; ask your health care provider or pharmacist if you have questions. COMMON BRAND NAME(S): Fluarix, Fluzone What should I tell my health care provider before I take this medicine? They need to know if you have any of these conditions:  bleeding disorder like hemophilia  fever or infection  Guillain-Barre syndrome or other neurological problems  immune system problems  infection with the human immunodeficiency virus (HIV) or AIDS  low blood platelet counts  multiple sclerosis  an unusual or allergic reaction to influenza virus vaccine, eggs, chicken proteins, latex, gentamicin, other medicines, foods, dyes or preservatives  pregnant or trying to get pregnant  breast-feeding How should I use this medicine? This vaccine is for injection into a muscle. It is given by a health care professional. A copy of Vaccine Information Statements will be given before each vaccination. Read this sheet carefully each time. The sheet may change frequently. Talk to your pediatrician regarding the use of this medicine in children. Special care may be needed. Overdosage: If you think you have taken too much of this medicine contact a poison control center or emergency room at once. NOTE: This medicine is only for you. Do not share this medicine with others. What if I miss a dose? This does not apply. What may interact with this medicine?  chemotherapy or radiation  therapy  medicines that lower your immune system like etanercept, anakinra, infliximab, and adalimumab  medicines that treat or prevent blood clots like warfarin  phenytoin  steroid medicines like prednisone or cortisone  theophylline  vaccines This list may not describe all possible interactions. Give your health care provider a list of all the medicines, herbs, non-prescription drugs, or dietary supplements you use. Also tell them if you smoke, drink alcohol, or use illegal drugs. Some items may interact with your medicine. What should I watch for while using this medicine? Report any side effects that do not go away within 3 days to your doctor or health care professional. Call your health care provider if any unusual symptoms occur within 6 weeks of receiving this vaccine. You may still catch the flu, but the illness is not usually as bad. You cannot get the flu from the vaccine. The vaccine will not protect against colds or other illnesses that may cause fever. The vaccine is needed every year. What side effects may I notice from receiving this medicine? Side effects that you should report to your doctor or health care professional as soon as possible:  allergic reactions like skin rash, itching or hives, swelling of the face, lips, or tongue Side effects that usually do not require medical attention (report to your doctor or health care professional if they continue or are bothersome):  fever  headache  muscle aches and pains  pain, tenderness, redness, or swelling at site where injected  weak or tired This list may not describe all possible side effects. Call your doctor for medical advice about  side effects. You may report side effects to FDA at 1-800-FDA-1088. Where should I keep my medicine? This vaccine is only given in a clinic, pharmacy, doctor's office, or other health care setting and will not be stored at home. NOTE: This sheet is a summary. It may not cover all  possible information. If you have questions about this medicine, talk to your doctor, pharmacist, or health care provider.  2020 Elsevier/Gold Standard (2008-06-22 09:30:40)

## 2020-10-23 NOTE — Progress Notes (Signed)
Patient ID: Linda Cooper, female    DOB: 03-07-1961  MRN: [id number]  CC: Diabetes and Hypertension   Subjective: Linda Cooper is a 59 y.o. female who presents for chronic ds management Her concerns today include:  Hx of HTN, DM, morbid obesity and OA knees  HYPERTENSION Currently taking: see medication list.  She is on hydralazine 75 mg twice a day, Cozaar/HCTZ Med Adherence: when SBP less than 120, she is afraid to take both BP meds at one time because she was having episodes of dizziness.  Dizziness would last 15-20 mins.  One time she did not eat on time.  Checked BP when feeling dizzy but does not check BS. Medication side effects: _0  Yes    _1  No Adherence with salt restriction: _2  Yes    _3  No Home Monitoring?: _4  Yes    _5  No Monitoring Frequency: Daily Home BP results range: _6  Yes    _7  No SOB? _8  Yes    _9  No Chest Pain?: _10  Yes    _11  No Leg swelling?: _12  Yes    _13  No Headaches?: _14  Yes    _15  No Dizziness? _16  Yes -sometimes in the afternoon.  On occasions when she feels dizzy, blood pressure readings on those occasions were 101/63, 103/66 and 120/70. Comments:   DM/obesity: Checking blood sugars every other day.  Gives range of 111-120. Eating less white carbs and less meat.  Weakness is popcorn.  Drinks water.  She has a protein shake once or twice a day as a meal substitute.  She is down 8 pounds since the end of July.  They knees: Saw Ortho.  Had injections which helped.  Will that she has to lose 60 to 80 pounds before they can do surgery.  HM: Due for mammogram, diabetic eye exam, flu shot and Pneumovax.  Cologuard was ordered in July.  She has not yet turned it in.  Patient Active Problem List   Diagnosis Date Noted  . Type 2 diabetes mellitus with hyperglycemia, without long-term current use of insulin ([place]) 06/22/2020  . Trigger middle finger of left hand 11/08/2019  . Carpal tunnel syndrome of left wrist 07/09/2019  . BMI 50.0-59.9, adult  ([place]) 10/16/2018  . Morbid obesity ([place]) 11/11/2014  . PCOS (polycystic ovarian syndrome) 11/10/2014  . Degenerative arthritis of knee, bilateral 11/10/2014  . Diabetes mellitus without complication ([place])   . Hypertension      Current Outpatient Medications on File Prior to Visit  Medication Sig Dispense Refill  . albuterol (PROVENTIL HFA;VENTOLIN HFA) 108 (90 Base) MCG/ACT inhaler Inhale 2 puffs into the lungs every 4 (four) hours as needed for wheezing. 1 Inhaler 0  . Blood Glucose Monitoring Suppl (CONTOUR NEXT MONITOR) w/Device KIT 1 Units by Does not apply route daily. 1 kit 0  . glucose blood (CONTOUR NEXT TEST) test strip Use as instructed once daily 100 each 12  . hydrALAZINE (APRESOLINE) 50 MG tablet Take 1.5 tablets (75 mg total) by mouth 2 (two) times daily. 90 tablet 5  . losartan-hydrochlorothiazide (HYZAAR) 100-25 MG tablet TAKE 1 TABLET BY MOUTH DAILY. 90 tablet 1  . metFORMIN (GLUCOPHAGE) 500 MG tablet Take 1 tablet (500 mg total) by mouth daily with breakfast. 90 tablet 3  . MICROLET LANCETS MISC Use as directed once daily 100 each 12  . Multiple Vitamins-Minerals (MULTIVITAMIN WITH MINERALS) tablet Take 1 tablet by mouth daily.    . naproxen (NAPROSYN) 500 MG tablet Take 1 tablet (500 mg total)  by mouth 2 (two) times daily. 60 tablet 0  . NON FORMULARY SEGA PRO BLADDER HEALTH 100 MG DAILY    . Vitamin D, Ergocalciferol, (DRISDOL) 1.25 MG (50000 UT) CAPS capsule Take 1 capsule (50,000 Units total) by mouth every 7 (seven) days. 4 capsule 0   No current facility-administered medications on file prior to visit.    Allergies  Allergen Reactions  . Ace Inhibitors Cough  . Erythromycin Base     Cramping in abdomen is reaction/no rash    Social History   Socioeconomic History  . Marital status: Single    Spouse name: Not on file  . Number of children: 1   . Years of education: college   . Highest education level: Not on file  Occupational History  . 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 Smoking status: Never Smoker  . Smokeless tobacco: Never Used  Vaping Use  . Vaping Use: Never used  Substance and Sexual Activity  . Alcohol use: No  . Drug use: No  . Sexual activity: Not on file  Other Topics Concern  . Not on file  Social History Narrative   Lives at home with son.    Son is 1.       She is motivated to lose weight and improve health by the expected death of her father  2017/01/15) and the sudden deaths of her cousin (found dead in car one month after death of his wife March 15, 2017)  and his wife (died after childbirth 02-15-2017)    Exercise: minimal    Social Determinants of Health   Financial Resource Strain:   . Difficulty of Paying Living Expenses: Not on file  Food Insecurity:   . Worried About Charity fundraiser in the Last Year: Not on file  . Ran Out of Food in the Last Year: Not on file  Transportation Needs:   . Lack of Transportation (Medical): Not on file  . Lack of Transportation (Non-Medical): Not on file  Physical Activity:   . Days of Exercise per Week: Not on file  . Minutes of Exercise per Session: Not on file  Stress:   . Feeling of Stress : Not on file  Social Connections:   . Frequency of Communication with Friends and Family: Not on file  . Frequency of Social Gatherings with Friends and Family: Not on file  . Attends Religious Services: Not on file  . Active Member of Clubs or Organizations: Not on file  . Attends Archivist Meetings: Not on file  . Marital Status: Not on file  Intimate Partner Violence:   . Fear of Current or Ex-Partner: Not on file  . Emotionally Abused: Not on file  . Physically Abused: Not on file  . Sexually Abused: Not on file    Family History  Problem Relation Age of Onset  . Hypertension Mother   . Diabetes Mother   . Stroke Mother   . Heart disease Mother   . Obesity Mother   . Hypertension Father   . Diabetes Father     . Stroke Father   . Obesity Father   . Heart disease Father   . Hyperlipidemia Father   . Diabetes Brother   . Hypertension Brother   . Gout Brother   . Heart disease Sister   . Breast cancer Cousin   . Cancer Neg Hx     Past Surgical  History:  Procedure Laterality Date  . ABDOMINAL HYSTERECTOMY  2006   . HERNIA REPAIR  3790    Umbilical     ROS: Review of Systems Negative except as stated above  PHYSICAL EXAM: BP 139/88   Pulse 81   Resp 16   Wt 281 lb 3.2 oz (127.6 kg)   SpO2 100%   BMI 53.13 kg/m   Wt Readings from Last 3 Encounters:  10/23/20 281 lb 3.2 oz (127.6 kg)  06/22/20 298 lb (135.2 kg)  07/09/19 289 lb 9.6 oz (131.4 kg)  Blood pressure sitting 140/83, pulse of 89 Blood pressure standing 117/78 pulse of 113  Physical Exam  General appearance - alert, well appearing, older obese African-American female and in no distress Mental status - normal mood, behavior, speech, dress, motor activity, and thought processes Neck - supple, no significant adenopathy Chest - clear to auscultation, no wheezes, rales or rhonchi, symmetric air entry Heart - normal rate, regular rhythm, normal S1, S2, no murmurs, rubs, clicks or gallops Extremities - peripheral pulses normal, no pedal edema, no clubbing or cyanosis  Lab Results  Component Value Date   HGBA1C 6.3 (H) 10/23/2020     CMP Latest Ref Rng & Units 11/11/2019 12/29/2018 09/24/2018  Glucose 65 - 99 mg/dL 112(H) 76 87  BUN 8 - 27 mg/dL 20 18 25(H)  Creatinine 0.57 - 1.00 mg/dL 0.78 0.72 0.71  Sodium 134 - 144 mmol/L 140 141 139  Potassium 3.5 - 5.2 mmol/L 4.3 4.1 3.8  Chloride 96 - 106 mmol/L 101 100 99  CO2 20 - 29 mmol/L _0 Calcium 8.7 - 10.3 mg/dL 9.4 9.1 9.3  Total Protein 6.0 - 8.5 g/dL 6.8 6.7 7.2  Total Bilirubin 0.0 - 1.2 mg/dL 0.9 0.7 0.6  Alkaline Phos 39 - 117 IU/L 94 85 95  AST 0 - 40 IU/L _1 ALT 0 - 32 IU/L _2 Lipid Panel     Component Value Date/Time   CHOL 142  11/11/2019 0919   TRIG 78 11/11/2019 0919   HDL 76 11/11/2019 0919   CHOLHDL 1.9 11/11/2019 0919   CHOLHDL 2.0 11/10/2014 1148   VLDL 21 11/10/2014 1148   LDLCALC 51 11/11/2019 0919    CBC    Component Value Date/Time   WBC 7.4 11/11/2019 0919   WBC 9.1 03/02/2018 1625   RBC 4.63 11/11/2019 0919   RBC 4.89 03/02/2018 1625   HGB 11.9 11/11/2019 0919   HCT 36.3 11/11/2019 0919   PLT 400 11/11/2019 0919   MCV 78 (L) 11/11/2019 0919   MCH 25.7 (L) 11/11/2019 0919   MCH 25.6 (L) 03/02/2018 1625   MCHC 32.8 11/11/2019 0919   MCHC 32.6 03/02/2018 1625   RDW 15.8 (H) 11/11/2019 0919   LYMPHSABS 3.0 12/29/2018 1546   EOSABS 0.4 12/29/2018 1546   BASOSABS 0.1 12/29/2018 1546    ASSESSMENT AND PLAN: 1. Type 2 diabetes mellitus with morbid obesity ([place]) Commended her on dietary changes and weight loss so far.  Encouraged her to continue working on trying to get her weight down.  Encouraged to move as much as her knees would allow.  Continue Metformin - POCT glucose (manual entry) - CBC - Comprehensive metabolic panel - Lipid panel - Hemoglobin A1c - Ambulatory referral to Ophthalmology  2. Essential hypertension 3. Dizziness Patient with orthostatic changes on blood pressure and pulse readings on standing.  Questionably due to autonomic dysfunction associated with diabetes.  Patient advised to go slow with position changes.  Change dose of hydralazine to 75 mg in the morning and 50 mg in the evening since she experiences the dizziness more so in the evenings.  4. Primary osteoarthritis of both knees Strongly encourage weight loss.  5. Need for influenza vaccination Given.  6. Encounter for screening mammogram for malignant neoplasm of breast - MM Digital Screening; Future  Patient encouraged to complete Cologuard test for colon cancer screening.  She promises that she will do so before next visit.  Patient was given the opportunity to ask questions.  Patient verbalized  understanding of the plan and was able to repeat key elements of the plan.   Orders Placed This Encounter  Procedures  . POCT glucose (manual entry)     Requested Prescriptions    No prescriptions requested or ordered in this encounter    No follow-ups on file.  Karle Plumber, MD, FACP

## 2020-10-23 NOTE — Progress Notes (Signed)
Patient presents for vaccination against influenza per orders of Dr. Johnson. Consent given. Counseling provided. No contraindications exists. Vaccine administered without incident.  ° °Luke Van Ausdall, PharmD, CPP °Clinical Pharmacist °[facility] °[phone] ° °

## 2020-10-24 LAB — COMPREHENSIVE METABOLIC PANEL
ALT: 17 IU/L (ref 0–32)
AST: 14 IU/L (ref 0–40)
Albumin/Globulin Ratio: 1.4 (ref 1.2–2.2)
Albumin: 4 g/dL (ref 3.8–4.8)
Alkaline Phosphatase: 84 IU/L (ref 44–121)
BUN/Creatinine Ratio: 34 — ABNORMAL HIGH (ref 12–28)
BUN: 25 mg/dL (ref 8–27)
Bilirubin Total: 0.6 mg/dL (ref 0.0–1.2)
CO2: 28 mmol/L (ref 20–29)
Calcium: 9.9 mg/dL (ref 8.7–10.3)
Chloride: 101 mmol/L (ref 96–106)
Creatinine, Ser: 0.74 mg/dL (ref 0.57–1.00)
GFR calc Af Amer: 101 mL/min/{1.73_m2} (ref >59)
GFR calc non Af Amer: 88 mL/min/{1.73_m2} (ref >59)
Globulin, Total: 2.8 g/dL (ref 1.5–4.5)
Glucose: 100 mg/dL — ABNORMAL HIGH (ref 65–99)
Potassium: 4.2 mmol/L (ref 3.5–5.2)
Sodium: 140 mmol/L (ref 134–144)
Total Protein: 6.8 g/dL (ref 6.0–8.5)

## 2020-10-24 LAB — LIPID PANEL
Chol/HDL Ratio: 2.2 ratio (ref 0.0–4.4)
Cholesterol, Total: 157 mg/dL (ref 100–199)
HDL: 73 mg/dL (ref >39)
LDL Chol Calc (NIH): 66 mg/dL (ref 0–99)
Triglycerides: 98 mg/dL (ref 0–149)
VLDL Cholesterol Cal: 18 mg/dL (ref 5–40)

## 2020-10-24 LAB — CBC
Hematocrit: 37.8 % (ref 34.0–46.6)
Hemoglobin: 12.4 g/dL (ref 11.1–15.9)
MCH: 26.3 pg — ABNORMAL LOW (ref 26.6–33.0)
MCHC: 32.8 g/dL (ref 31.5–35.7)
MCV: 80 fL (ref 79–97)
Platelets: 418 10*3/uL (ref 150–450)
RBC: 4.72 x10E6/uL (ref 3.77–5.28)
RDW: 15.7 % — ABNORMAL HIGH (ref 11.7–15.4)
WBC: 9.7 10*3/uL (ref 3.4–10.8)

## 2020-10-24 LAB — HEMOGLOBIN A1C
Est. average glucose Bld gHb Est-mCnc: 134 mg/dL
Hgb A1c MFr Bld: 6.3 % — ABNORMAL HIGH (ref 4.8–5.6)

## 2020-11-09 ENCOUNTER — Other Ambulatory Visit: Payer: Self-pay | Admitting: Internal Medicine

## 2020-11-09 DIAGNOSIS — E119 Type 2 diabetes mellitus without complications: Secondary | ICD-10-CM

## 2020-11-09 MED FILL — CONTOUR NEXT STRIPS: 50 days supply | Qty: 50 | Fill #0

## 2020-11-09 NOTE — Telephone Encounter (Signed)
Requested Prescriptions  Pending Prescriptions Disp Refills  . CONTOUR NEXT TEST test strip [Pharmacy Med Name: CONTOUR NEXT STRIPS Strip] 50 strip 12    Sig: USE AS INSTRUCTED ONCE DAILY     Endocrinology: Diabetes - Testing Supplies Passed - 11/09/2020  2:10 PM      Passed - Valid encounter within last 12 months    Recent Outpatient Visits          2 weeks ago Need for influenza vaccination   [facility] [place], Jeannett Senior L, RPH-CPP   2 weeks ago Type 2 diabetes mellitus with morbid obesity ([facility])   [facility] Jonah Blue B, MD   4 months ago Type 2 diabetes mellitus with morbid obesity [facility])   [place] [facility] Marcine Matar, MD   1 year ago Diabetes mellitus without complication [facility])   [facility] Marcine Matar, MD   1 year ago Diabetes mellitus without complication [facility])   [facility] Marcine Matar, MD      Future Appointments            In 3 months Laural Benes, Binnie Rail, MD [facility]

## 2020-11-13 DIAGNOSIS — Z20822 Contact with and (suspected) exposure to covid-19: Secondary | ICD-10-CM | POA: Diagnosis not present

## 2020-12-08 ENCOUNTER — Other Ambulatory Visit: Payer: Self-pay | Admitting: Internal Medicine

## 2020-12-08 DIAGNOSIS — E119 Type 2 diabetes mellitus without complications: Secondary | ICD-10-CM

## 2020-12-10 ENCOUNTER — Other Ambulatory Visit: Payer: Self-pay | Admitting: Internal Medicine

## 2020-12-10 NOTE — Telephone Encounter (Signed)
Requested Prescriptions  Pending Prescriptions Disp Refills  . metFORMIN (GLUCOPHAGE) 500 MG tablet [Pharmacy Med Name: METFORMIN HCL 500 MG TABS 500 Tablet] 90 tablet 0    Sig: TAKE 1 TABLET (500 MG TOTAL) BY MOUTH DAILY WITH BREAKFAST.     Endocrinology:  Diabetes - Biguanides Passed - 12/08/2020  9:21 AM      Passed - Cr in normal range and within 360 days    Creat  Date Value Ref Range Status  12/26/2014 0.77 0.50 - 1.10 mg/dL Final   Creatinine, Ser  Date Value Ref Range Status  10/23/2020 0.74 0.57 - 1.00 mg/dL Final   Creatinine, Urine  Date Value Ref Range Status  11/10/2014 28.9 mg/dL Final    Comment:    No reference range established.         Passed - HBA1C is between 0 and 7.9 and within 180 days    HbA1c, POC (prediabetic range)  Date Value Ref Range Status  06/22/2020 6.1 5.7 - 6.4 % Final   Hgb A1c MFr Bld  Date Value Ref Range Status  10/23/2020 6.3 (H) 4.8 - 5.6 % Final    Comment:             Prediabetes: 5.7 - 6.4          Diabetes: >6.4          Glycemic control for adults with diabetes: <7.0          Passed - AA eGFR in normal range and within 360 days    GFR calc Af Amer  Date Value Ref Range Status  10/23/2020 101 >59 mL/min/1.73 Final    Comment:    **In accordance with recommendations from the NKF-ASN Task force,**   [facility] is in the process of updating its eGFR calculation to the   2021 CKD-EPI creatinine equation that estimates kidney function   without a race variable.    GFR calc non Af Amer  Date Value Ref Range Status  10/23/2020 88 >59 mL/min/1.73 Final         Passed - Valid encounter within last 6 months    Recent Outpatient Visits          1 month ago Need for influenza vaccination   [place], Jarome Matin, RPH-CPP   1 month ago Type 2 diabetes mellitus with morbid obesity ([place])   [place] Karle Plumber B, MD   5 months ago Type 2 diabetes  mellitus with morbid obesity [facility])   [place] Ladell Pier, MD   1 year ago Diabetes mellitus without complication [facility])   [employer], MD   1 year ago Diabetes mellitus without complication [facility])   [place], MD      Future Appointments            In 2 months Wynetta Emery Dalbert Batman, MD [place]

## 2020-12-11 MED FILL — METFORMIN HCL 500 MG TABS: 500 | 90 days supply | Qty: 90 | Fill #0

## 2021-01-11 ENCOUNTER — Other Ambulatory Visit: Payer: Self-pay | Admitting: Internal Medicine

## 2021-01-11 DIAGNOSIS — I10 Essential (primary) hypertension: Secondary | ICD-10-CM

## 2021-01-11 MED FILL — LOSARTAN-HCTZ 100-25 MG TAB: 100-25 | 90 days supply | Qty: 90 | Fill #1

## 2021-01-11 MED FILL — hydrALAZINE HCL 50 MG TABS: 50 | 30 days supply | Qty: 90 | Fill #0

## 2021-02-13 ENCOUNTER — Telehealth: Payer: BC Managed Care – PPO | Admitting: Internal Medicine

## 2021-02-20 ENCOUNTER — Telehealth: Payer: BC Managed Care – PPO | Admitting: Internal Medicine

## 2021-03-23 ENCOUNTER — Other Ambulatory Visit: Payer: Self-pay | Admitting: Internal Medicine

## 2021-03-23 DIAGNOSIS — E119 Type 2 diabetes mellitus without complications: Secondary | ICD-10-CM

## 2021-03-23 MED ORDER — METFORMIN HCL 500 MG PO TABS
ORAL_TABLET | Freq: Every day | ORAL | 0 refills | Status: DC
  Filled 2021-03-23: qty 30, 30d supply, fill #0

## 2021-03-26 ENCOUNTER — Other Ambulatory Visit: Payer: Self-pay

## 2021-03-27 ENCOUNTER — Other Ambulatory Visit: Payer: Self-pay

## 2021-04-06 ENCOUNTER — Telehealth: Payer: Self-pay | Admitting: Internal Medicine

## 2021-04-11 ENCOUNTER — Ambulatory Visit: Payer: Self-pay | Attending: Internal Medicine | Admitting: Physician Assistant

## 2021-04-11 ENCOUNTER — Other Ambulatory Visit: Payer: Self-pay

## 2021-04-11 DIAGNOSIS — E559 Vitamin D deficiency, unspecified: Secondary | ICD-10-CM

## 2021-04-11 DIAGNOSIS — J40 Bronchitis, not specified as acute or chronic: Secondary | ICD-10-CM

## 2021-04-11 DIAGNOSIS — I10 Essential (primary) hypertension: Secondary | ICD-10-CM

## 2021-04-11 DIAGNOSIS — E119 Type 2 diabetes mellitus without complications: Secondary | ICD-10-CM

## 2021-04-11 MED ORDER — HYDRALAZINE HCL 50 MG PO TABS
ORAL_TABLET | ORAL | 1 refills | Status: DC
  Filled 2021-04-11 – 2021-04-26 (×2): qty 90, 30d supply, fill #0
  Filled 2021-07-23: qty 90, 30d supply, fill #1

## 2021-04-11 MED ORDER — ALBUTEROL SULFATE HFA 108 (90 BASE) MCG/ACT IN AERS: 2.0000 | INHALATION_SPRAY | RESPIRATORY_TRACT | 0 refills | Status: DC | PRN

## 2021-04-11 MED ORDER — ALBUTEROL SULFATE HFA 108 (90 BASE) MCG/ACT IN AERS
2.0000 | INHALATION_SPRAY | RESPIRATORY_TRACT | 0 refills | Status: DC | PRN
  Filled 2021-04-11 – 2021-04-26 (×2): qty 18, 25d supply, fill #0

## 2021-04-11 MED ORDER — METFORMIN HCL 500 MG PO TABS
500.0000 mg | ORAL_TABLET | Freq: Every day | ORAL | 1 refills | Status: DC
  Filled 2021-04-11 – 2021-04-26 (×2): qty 90, 90d supply, fill #0

## 2021-04-11 MED ORDER — LOSARTAN POTASSIUM-HCTZ 100-25 MG PO TABS
1.0000 | ORAL_TABLET | Freq: Every day | ORAL | 1 refills | Status: DC
  Filled 2021-04-11 – 2021-04-26 (×2): qty 30, 30d supply, fill #0
  Filled 2021-05-31: qty 30, 30d supply, fill #1
  Filled 2021-07-06: qty 30, 30d supply, fill #2
  Filled 2021-08-06: qty 30, 30d supply, fill #3

## 2021-04-11 NOTE — Addendum Note (Signed)
Addended by: Anders Simmonds on: 04/11/2021 02:20 PM   Modules accepted: Orders

## 2021-04-11 NOTE — Progress Notes (Signed)
Virtual Visit via Telephone Note  I connected with Linda Cooper on 04/11/21 at 11:10 AM EDT by telephone and verified that I am speaking with the correct person using two identifiers.  Location: Patient: home Provider: [facility]  I discussed the limitations, risks, security and privacy concerns of performing an evaluation and management service by telephone and the availability of in person appointments. I also discussed with the patient that there may be a patient responsible charge related to this service. The patient expressed understanding and agreed to proceed.   History of Present Illness:  Patient is doing well and needs med RF.  BP running 118-154/80-87.  Blood sugars running from 120-130s.  Compliant with meds.  Has been eating too much sugar lately.  No new issues or concerns    Observations/Objective:  NAD.  A&Ox3   Assessment and Plan:  1. Diabetes mellitus without complication (HCC) - metFORMIN (GLUCOPHAGE) 500 MG tablet; Take 1 tablet (500 mg total) by mouth daily with breakfast.  Dispense: 90 tablet; Refill: 1 - Hemoglobin A1c; Future - Lipid panel; Future - Comprehensive metabolic panel; Future  2. Essential hypertension suboptimal control but many normotensive reading and sometimes she forgets her second dose of hydralazine - losartan-hydrochlorothiazide (HYZAAR) 100-25 MG tablet; TAKE 1 TABLET BY MOUTH DAILY.  Dispense: 90 tablet; Refill: 1 - hydrALAZINE (APRESOLINE) 50 MG tablet; TAKE 1.5 TABLETS (75 MG TOTAL) BY MOUTH 2 (TWO) TIMES DAILY.  Dispense: 90 tablet; Refill: 1 - Comprehensive metabolic panel; Future  3. Bronchospasm - albuterol (VENTOLIN HFA) 108 (90 Base) MCG/ACT inhaler; Inhale 2 puffs into the lungs every 4 (four) hours as needed for wheezing.  Dispense: 1 each; Refill: 0  4. Vitamin D deficiency - Vitamin D, 25-hydroxy; Future    Follow Up Instructions: See PCP in 3 months   I discussed the assessment and treatment plan with the patient. The  patient was provided an opportunity to ask questions and all were answered. The patient agreed with the plan and demonstrated an understanding of the instructions.   The patient was advised to call back or seek an in-person evaluation if the symptoms worsen or if the condition fails to improve as anticipated.  I provided 14 minutes of non-face-to-face time during this encounter.   Georgian Co, PA-C  Patient ID: Linda Cooper, female   DOB: 10/29/1961, 60 y.o.   MRN: [id number]

## 2021-04-18 ENCOUNTER — Other Ambulatory Visit: Payer: Self-pay

## 2021-04-26 ENCOUNTER — Other Ambulatory Visit: Payer: Self-pay

## 2021-04-26 ENCOUNTER — Ambulatory Visit: Payer: Self-pay | Attending: Internal Medicine

## 2021-04-26 DIAGNOSIS — E119 Type 2 diabetes mellitus without complications: Secondary | ICD-10-CM

## 2021-04-26 DIAGNOSIS — E559 Vitamin D deficiency, unspecified: Secondary | ICD-10-CM

## 2021-04-26 DIAGNOSIS — I10 Essential (primary) hypertension: Secondary | ICD-10-CM

## 2021-04-27 LAB — COMPREHENSIVE METABOLIC PANEL
ALT: 16 IU/L (ref 0–32)
AST: 16 IU/L (ref 0–40)
Albumin/Globulin Ratio: 1.7 (ref 1.2–2.2)
Albumin: 4.1 g/dL (ref 3.8–4.8)
Alkaline Phosphatase: 99 IU/L (ref 44–121)
BUN/Creatinine Ratio: 33 — ABNORMAL HIGH (ref 12–28)
BUN: 24 mg/dL (ref 8–27)
Bilirubin Total: 0.6 mg/dL (ref 0.0–1.2)
CO2: 22 mmol/L (ref 20–29)
Calcium: 9.2 mg/dL (ref 8.7–10.3)
Chloride: 102 mmol/L (ref 96–106)
Creatinine, Ser: 0.72 mg/dL (ref 0.57–1.00)
Globulin, Total: 2.4 g/dL (ref 1.5–4.5)
Glucose: 101 mg/dL — ABNORMAL HIGH (ref 65–99)
Potassium: 4.4 mmol/L (ref 3.5–5.2)
Sodium: 140 mmol/L (ref 134–144)
Total Protein: 6.5 g/dL (ref 6.0–8.5)
eGFR: 95 mL/min/{1.73_m2} (ref >59)

## 2021-04-27 LAB — LIPID PANEL
Chol/HDL Ratio: 2.3 ratio (ref 0.0–4.4)
Cholesterol, Total: 152 mg/dL (ref 100–199)
HDL: 65 mg/dL (ref >39)
LDL Chol Calc (NIH): 69 mg/dL (ref 0–99)
Triglycerides: 100 mg/dL (ref 0–149)
VLDL Cholesterol Cal: 18 mg/dL (ref 5–40)

## 2021-04-27 LAB — HEMOGLOBIN A1C
Est. average glucose Bld gHb Est-mCnc: 134 mg/dL
Hgb A1c MFr Bld: 6.3 % — ABNORMAL HIGH (ref 4.8–5.6)

## 2021-04-27 LAB — VITAMIN D 25 HYDROXY (VIT D DEFICIENCY, FRACTURES): Vit D, 25-Hydroxy: 40.4 ng/mL (ref 30.0–100.0)

## 2021-05-01 ENCOUNTER — Other Ambulatory Visit: Payer: Self-pay

## 2021-05-02 ENCOUNTER — Other Ambulatory Visit: Payer: Self-pay

## 2021-05-02 ENCOUNTER — Other Ambulatory Visit: Payer: Self-pay | Admitting: Physician Assistant

## 2021-05-02 DIAGNOSIS — E119 Type 2 diabetes mellitus without complications: Secondary | ICD-10-CM

## 2021-05-02 MED ORDER — METFORMIN HCL 500 MG PO TABS
500.0000 mg | ORAL_TABLET | Freq: Two times a day (BID) | ORAL | 1 refills | Status: DC
  Filled 2021-05-02 – 2021-07-23 (×2): qty 180, 90d supply, fill #0

## 2021-05-31 ENCOUNTER — Other Ambulatory Visit: Payer: Self-pay

## 2021-06-04 ENCOUNTER — Other Ambulatory Visit (HOSPITAL_COMMUNITY): Payer: Self-pay

## 2021-07-06 ENCOUNTER — Other Ambulatory Visit: Payer: Self-pay

## 2021-07-09 ENCOUNTER — Other Ambulatory Visit: Payer: Self-pay

## 2021-07-23 ENCOUNTER — Other Ambulatory Visit: Payer: Self-pay

## 2021-07-24 ENCOUNTER — Other Ambulatory Visit: Payer: Self-pay

## 2021-07-27 ENCOUNTER — Ambulatory Visit: Payer: Self-pay | Admitting: Internal Medicine

## 2021-08-06 ENCOUNTER — Other Ambulatory Visit: Payer: Self-pay

## 2021-08-16 ENCOUNTER — Other Ambulatory Visit: Payer: Self-pay

## 2021-08-16 ENCOUNTER — Ambulatory Visit: Payer: Self-pay | Admitting: *Deleted

## 2021-08-16 ENCOUNTER — Ambulatory Visit: Payer: Self-pay | Admitting: Physician Assistant

## 2021-08-16 VITALS — BP 172/103 | HR 86 | Temp 98.2°F | Resp 18 | Ht 62.0 in | Wt 295.0 lb

## 2021-08-16 DIAGNOSIS — K0889 Other specified disorders of teeth and supporting structures: Secondary | ICD-10-CM

## 2021-08-16 DIAGNOSIS — I10 Essential (primary) hypertension: Secondary | ICD-10-CM

## 2021-08-16 DIAGNOSIS — Z6841 Body Mass Index (BMI) 40.0 and over, adult: Secondary | ICD-10-CM

## 2021-08-16 MED ORDER — AMOXICILLIN-POT CLAVULANATE 875-125 MG PO TABS
1.0000 | ORAL_TABLET | Freq: Two times a day (BID) | ORAL | 0 refills | Status: DC
  Filled 2021-08-16: qty 20, 10d supply, fill #0

## 2021-08-16 MED ORDER — ACETAMINOPHEN-CODEINE #3 300-30 MG PO TABS
1.0000 | ORAL_TABLET | Freq: Four times a day (QID) | ORAL | 0 refills | Status: AC | PRN
  Filled 2021-08-16: qty 24, 3d supply, fill #0

## 2021-08-16 NOTE — Progress Notes (Signed)
Established Patient Office Visit  Subjective:  Patient ID: Linda Cooper, female    DOB: [birth year]  Age: 60 y.o. MRN: [id number]  CC:  Chief Complaint  Patient presents with   Dental Pain    Top left side    HPI Linda Cooper  presents with dental pain x 1 day. Reports broken tooth to L upper jaw. Has tried OTC Aleve with a little relief. Worse with chewing. Describes shooting pain to temple and ear. Does not see a dentist regularly. Reports similar symptoms in the past.  Reports that she does check her BP at home, did not take BP meds yet today.  States reading this AM of 143/79 with wrist BP cuff, yesterday was lower but actual number unknown.    Past Medical History:  Diagnosis Date   'light-for-dates' infant with signs of fetal malnutrition    Anemia    Arthritis    Asthma    Diabetes mellitus without complication ([place]) Dx 2751   GERD (gastroesophageal reflux disease)    Hypertension Dx 1990   Knee pain, bilateral    Obesity    Rheumatic fever childhood    has intermittent murmur and hand arthritis     Past Surgical History:  Procedure Laterality Date   ABDOMINAL HYSTERECTOMY  02-05-2005    HERNIA REPAIR  7001    Umbilical     Family History  Problem Relation Age of Onset   Hypertension Mother    Diabetes Mother    Stroke Mother    Heart disease Mother    Obesity Mother    Hypertension Father    Diabetes Father    Stroke Father    Obesity Father    Heart disease Father    Hyperlipidemia Father    Diabetes Brother    Hypertension Brother    Gout Brother    Heart disease Sister    Breast cancer Cousin    Cancer Neg Hx     Social History   Socioeconomic History   Marital status: Single    Spouse name: Not on file   Number of children: 1    Years of education: college    Highest education level: Not on file  Occupational History   Occupation: Forensic scientist    Comment: [employer], in home care,   Tobacco Use   Smoking status: Never    Smokeless tobacco: Never  Vaping Use   Vaping Use: Never used  Substance and Sexual Activity   Alcohol use: No   Drug use: No   Sexual activity: Not Currently  Other Topics Concern   Not on file  Social History Narrative   Lives at home with son.    Son is 42.       She is motivated to lose weight and improve health by the expected death of her father  01-05-17) and the sudden deaths of her cousin (found dead in car one month after death of his wife 03/05/17)  and his wife (died after childbirth 2017/02/05)    Exercise: minimal    Social Determinants of Health   Financial Resource Strain: Not on file  Food Insecurity: Not on file  Transportation Needs: Not on file  Physical Activity: Not on file  Stress: Not on file  Social Connections: Not on file  Intimate Partner Violence: Not on file    Outpatient Medications Prior to Visit  Medication Sig Dispense Refill   albuterol (VENTOLIN HFA) 108 (90 Base) MCG/ACT inhaler  Inhale 2 puffs into the lungs every 4 (four) hours as needed for wheezing. 18 g 0   Blood Glucose Monitoring Suppl (CONTOUR NEXT MONITOR) w/Device KIT 1 Units by Does not apply route daily. 1 kit 0   glucose blood test strip USE AS INSTRUCTED ONCE DAILY 50 strip 12   hydrALAZINE (APRESOLINE) 50 MG tablet TAKE 1.5 TABLETS (75 MG TOTAL) BY MOUTH 2 (TWO) TIMES DAILY. 90 tablet 1   losartan-hydrochlorothiazide (HYZAAR) 100-25 MG tablet TAKE 1 TABLET BY MOUTH DAILY. 90 tablet 1   metFORMIN (GLUCOPHAGE) 500 MG tablet Take 1 tablet (500 mg total) by mouth 2 (two) times daily with a meal. 180 tablet 1   MICROLET LANCETS MISC Use as directed once daily 100 each 12   Multiple Vitamins-Minerals (MULTIVITAMIN WITH MINERALS) tablet Take 1 tablet by mouth daily.     naproxen (NAPROSYN) 500 MG tablet Take 1 tablet (500 mg total) by mouth 2 (two) times daily. 60 tablet 0   NON FORMULARY SEGA PRO BLADDER HEALTH 100 MG DAILY     Vitamin D, Ergocalciferol, (DRISDOL) 1.25 MG (50000 UT) CAPS  capsule Take 1 capsule (50,000 Units total) by mouth every 7 (seven) days. 4 capsule 0   No facility-administered medications prior to visit.    Allergies  Allergen Reactions   Ace Inhibitors Cough   Erythromycin Base     Cramping in abdomen is reaction/no rash    ROS Review of Systems  Constitutional:  Negative for chills and fever.  HENT:  Positive for dental problem.   Eyes: Negative.   Respiratory:  Negative for shortness of breath.   Cardiovascular:  Negative for chest pain.  Gastrointestinal:  Negative for nausea and vomiting.  Endocrine: Negative.   Genitourinary: Negative.   Musculoskeletal:  Negative for neck pain.  Skin: Negative.   Allergic/Immunologic: Negative.   Neurological: Negative.   Hematological: Negative.   Psychiatric/Behavioral: Negative.       Objective:    Physical Exam Vitals and nursing note reviewed.  Constitutional:      General: She is not in acute distress.    Appearance: Normal appearance. She is obese. She is not ill-appearing.  HENT:     Head: Normocephalic and atraumatic.     Right Ear: External ear normal.     Left Ear: External ear normal.     Nose: Nose normal.     Mouth/Throat:     Mouth: Mucous membranes are moist.     Dentition: Abnormal dentition. Dental tenderness, dental caries and dental abscesses present.      Comments: Broken tooth noted, swelling noted Eyes:     Extraocular Movements: Extraocular movements intact.     Conjunctiva/sclera: Conjunctivae normal.     Pupils: Pupils are equal, round, and reactive to light.  Cardiovascular:     Rate and Rhythm: Normal rate and regular rhythm.     Pulses: Normal pulses.     Heart sounds: Murmur heard.  Musculoskeletal:        General: Normal range of motion.     Cervical back: Normal range of motion and neck supple.     Comments: Using cane   Skin:    General: Skin is warm and dry.  Neurological:     General: No focal deficit present.     Mental Status: She is  alert and oriented to person, place, and time.  Psychiatric:        Mood and Affect: Mood normal.  Behavior: Behavior normal.        Thought Content: Thought content normal.        Judgment: Judgment normal.    BP (!) 172/103 (BP Location: Left Arm, Patient Position: Sitting, Cuff Size: Large)   Pulse 86   Temp 98.2 F (36.8 C) (Oral)   Resp 18   Ht 5' 2"  (1.575 m)   Wt 295 lb (133.8 kg)   SpO2 99%   BMI 53.96 kg/m  Wt Readings from Last 3 Encounters:  08/16/21 295 lb (133.8 kg)  10/23/20 281 lb 3.2 oz (127.6 kg)  06/22/20 298 lb (135.2 kg)     Health Maintenance Due  Topic Date Due   OPHTHALMOLOGY EXAM  Never done   COLONOSCOPY (Pts 45-58yr Insurance coverage will need to be confirmed)  Never done   Zoster Vaccines- Shingrix (1 of 2) Never done   FOOT EXAM  02/12/2020   COVID-19 Vaccine (3 - Booster for [employer]) 07/25/2020   MAMMOGRAM  10/13/2020   INFLUENZA VACCINE  07/09/2021    There are no preventive care reminders to display for this patient.  Lab Results  Component Value Date   TSH 0.977 12/29/2018   Lab Results  Component Value Date   WBC 9.7 10/23/2020   HGB 12.4 10/23/2020   HCT 37.8 10/23/2020   MCV 80 10/23/2020   PLT 418 10/23/2020   Lab Results  Component Value Date   NA 140 04/26/2021   K 4.4 04/26/2021   CO2 22 04/26/2021   GLUCOSE 101 (H) 04/26/2021   BUN 24 04/26/2021   CREATININE 0.72 04/26/2021   BILITOT 0.6 04/26/2021   ALKPHOS 99 04/26/2021   AST 16 04/26/2021   ALT 16 04/26/2021   PROT 6.5 04/26/2021   ALBUMIN 4.1 04/26/2021   CALCIUM 9.2 04/26/2021   ANIONGAP 9 03/02/2018   EGFR 95 04/26/2021   Lab Results  Component Value Date   CHOL 152 04/26/2021   Lab Results  Component Value Date   HDL 65 04/26/2021   Lab Results  Component Value Date   LDLCALC 69 04/26/2021   Lab Results  Component Value Date   TRIG 100 04/26/2021   Lab Results  Component Value Date   CHOLHDL 2.3 04/26/2021   Lab Results   Component Value Date   HGBA1C 6.3 (H) 04/26/2021      Assessment & Plan:   Problem List Items Addressed This Visit       Cardiovascular and Mediastinum   Elevated blood pressure reading in office with diagnosis of hypertension     Other   Morbid obesity with body mass index (BMI) of 50.0 to 59.9 in adult (HCC)   Pain, dental - Primary   Relevant Medications   amoxicillin-clavulanate (AUGMENTIN) 875-125 MG tablet   acetaminophen-codeine (TYLENOL #3) 300-30 MG tablet    Meds ordered this encounter  Medications   amoxicillin-clavulanate (AUGMENTIN) 875-125 MG tablet    Sig: Take 1 tablet by mouth 2 (two) times daily.    Dispense:  20 tablet    Refill:  0    Order Specific Question:   Supervising Provider    Answer:   WElsie Stain[[id number]]   acetaminophen-codeine (TYLENOL #3) 300-30 MG tablet    Sig: Take 1-2 tablets by mouth every 6 (six) hours as needed for up to 3 days for moderate pain.    Dispense:  24 tablet    Refill:  0    Order Specific Question:   Supervising Provider  Answer:   Asencion Noble E [[id number]]  1. Pain, dental Trial Augmentin, Tylenol 3 for severe pain.  Check of [place] controlled substance registry within normal limits.  Red flags given for prompt reevaluation.  Patient education given on supportive care, strongly encouraged to follow-up with dentistry as soon as possible. - amoxicillin-clavulanate (AUGMENTIN) 875-125 MG tablet; Take 1 tablet by mouth 2 (two) times daily.  Dispense: 20 tablet; Refill: 0 - acetaminophen-codeine (TYLENOL #3) 300-30 MG tablet; Take 1-2 tablets by mouth every 6 (six) hours as needed for up to 3 days for moderate pain.  Dispense: 24 tablet; Refill: 0  2. Elevated blood pressure reading in office with diagnosis of hypertension Blood pressure elevated in office today, encourage patient to continue checking blood pressure at home, keep a written log and have available for all office visits.  Red flags given for prompt  reevaluation.  3. Morbid obesity with body mass index (BMI) of 50.0 to 59.9 in adult [facility])    I have reviewed the patient's medical history (PMH, PSH, Social History, Family History, Medications, and allergies) , and have been updated if relevant. I spent 30 minutes reviewing chart and  face to face time with patient.     Follow-up: Return if symptoms worsen or fail to improve.    Loraine Grip Mayers, PA-C

## 2021-08-16 NOTE — Progress Notes (Signed)
Patient reports top left side tooth pain beginning last night. Patient has taken aleve with minimal relief. Patient has not taken medication today and patient has not eaten today. Patient last took medication on yesterday. Patient has had this pain in the past and felt as though she over qualified for the income based dental services.

## 2021-08-16 NOTE — Telephone Encounter (Signed)
Left sided facial pain since last night. Left upper tooth that has been broken and treated for infection in the past. Has not seen dentist related to no insurance. No fever, no swelling reported. No difficulty breathing/swallowing at this time. Care Advice including tylenol and apply cold compress and go to the mobile clinic today for treatment and referral to dentist. Agreed with plan.     Reason for Disposition  Cracked or chipped tooth  Answer Assessment - Initial Assessment Questions 1. LOCATION: "Which tooth is hurting?"  (e.g., right-side/left-side, upper/lower, front/back)     Left upper  2. ONSET: "When did the toothache start?"  (e.g., hours, days)      Severe pain started last night. 3. SEVERITY: "How bad is the toothache?"  (Scale 1-10; mild, moderate or severe)   - MILD (1-3): doesn't interfere with chewing    - MODERATE (4-7): interferes with chewing, interferes with normal activities, awakens from sleep     - SEVERE (8-10): unable to eat, unable to do any normal activities, excruciating pain        Has had a broken tooth since at least October. 4. SWELLING: "Is there any visible swelling of your face?"     Pain more than anything 5. OTHER SYMPTOMS: "Do you have any other symptoms?" (e.g., fever)     No fever 6. PREGNANCY: "Is there any chance you are pregnant?" "When was your last menstrual period?"     na  Protocols used: [facility]

## 2021-08-16 NOTE — Patient Instructions (Addendum)
You will take augmentin twice a day for the next 10 days.  You can use Tylenol or ibuprofen over the counter for pain and Tylenol-3 for severe pain.  I encourage you to follow up with dentistry ASAP  Your blood pressure is elevated today, please make sure to check your blood pressure at home several times a day, keep a written log and have available for your next office visit.  Please feel free to return to the mobile unit if your readings continue to be elevated.  I hope that you feel better soon, please let us know if there is anything else we can do for you  Roney Jaffe, PA-C Physician Assistant [facility] [email or web address]   Dental Abscess A dental abscess is an infection around a tooth that may involve pain, swelling, and a collection of pus, as well as other symptoms. Treatment is important to help with symptoms and to prevent the infection from spreading. The general types of dental abscesses are: Pulpal abscess. This abscess may form from the inner part of the tooth (pulp). Periodontal abscess. This abscess may form from the gum. What are the causes? This condition is caused by a bacterial infection in or around the tooth. It may result from: Severe tooth decay (cavities). Trauma to the tooth, such as a broken or chipped tooth. What increases the risk? This condition is more likely to develop in males. It is also more likely to develop in people who: Have cavities. Have severe gum disease. Eat sugary snacks between meals. Use tobacco products. Have diabetes. Have a weakened disease-fighting system (immune system). Do not brush and care for their teeth regularly. What are the signs or symptoms? Mild symptoms of this condition include: Tenderness. Bad breath. Fever. A bitter taste in the mouth. Pain in and around the infected tooth. Moderate symptoms of this condition include: Swollen neck glands. Chills. Pus  drainage. Swelling and redness around the infected tooth, in the mouth, or in the face. Severe pain in and around the infected tooth. Severe symptoms of this condition include: Difficulty swallowing. Difficulty opening the mouth. Nausea. Vomiting. How is this diagnosed? This condition is diagnosed based on: Your symptoms and your medical and dental history. An examination of the infected tooth. During the exam, your dental care provider may tap on the infected tooth. You may also need to have X-rays taken of the affected area. How is this treated? This condition is treated by getting rid of the infection. This may be done with: Antibiotic medicines. These may be used in certain situations. Antibacterial mouth rinse. Incision and drainage. This procedure is done by making an incision in the abscess to drain out the pus. Removing pus is the first priority in treating an abscess. A root canal. This may be performed to save the tooth. Your dental care provider accesses the visible part of your tooth (crown) with a drill and removes any infected pulp. Then the space is filled and sealed off. Tooth extraction. The tooth is pulled out if it cannot be saved by other treatment. You may also receive treatment for pain, such as: Acetaminophen or NSAIDs. Gels that contain a numbing medicine. An injection to block the pain near your nerve. Follow these instructions at home: Medicines Take over-the-counter and prescription medicines only as told by your dental care provider. If you were prescribed an antibiotic, take it as told by your dental care provider. Do not stop taking the antibiotic even if you start to feel  better. If you were prescribed a gel that contains a numbing medicine, use it exactly as told in the directions. Do not use these gels for children who are younger than 67 years of age. Use an antibacterial mouth rinse as told by your dental care provider. General instructions  Gargle  with a mixture of salt and water 3-4 times a day or as needed. To make salt water, completely dissolve -1 tsp (3-6 g) of salt in 1 cup (237 mL) of warm water. Eat a soft diet while your abscess is healing. Drink enough fluid to keep your urine pale yellow. Do not apply heat to the outside of your mouth. Do not use any products that contain nicotine or tobacco. These products include cigarettes, chewing tobacco, and vaping devices, such as e-cigarettes. If you need help quitting, ask your dental care provider. Keep all follow-up visits. This is important. How is this prevented?  Excellent dental home care, which includes brushing your teeth every morning and night with fluoride toothpaste. Floss one time each day. Get regularly scheduled dental cleanings. Consider having a dental sealant applied on teeth that have deep grooves to prevent cavities. Drink fluoridated water regularly. This includes most tap water. Check the label on bottled water to see if it contains fluoride. Reduce or eliminate sugary drinks. Eat healthy meals and snacks. Wear a mouth guard or face shield to protect your teeth while playing sports. Contact a health care provider if: Your pain is worse and is not helped by medicine. You have swelling. You see pus around the tooth. You have a fever or chills. Get help right away if: Your symptoms suddenly get worse. You have a very bad headache. You have problems breathing or swallowing. You have trouble opening your mouth. You have swelling in your neck or around your eye. These symptoms may represent a serious problem that is an emergency. Do not wait to see if the symptoms will go away. Get medical help right away. Call your local emergency services (911 in the [place] Do not drive yourself to the hospital. Summary A dental abscess is a collection of pus in or around a tooth that results from an infection. A dental abscess may result from severe tooth decay, trauma to  the tooth, or severe gum disease around a tooth. Symptoms include severe pain, swelling, redness, and drainage of pus in and around the infected tooth. The first priority in treating a dental abscess is to drain out the pus. Treatment may also involve removing damage inside the tooth (root canal) or extracting the tooth. This information is not intended to replace advice given to you by your health care provider. Make sure you discuss any questions you have with your health care provider. Document Revised: 02/01/2021 Document Reviewed: 02/01/2021 Elsevier Patient Education  2022 [employer].

## 2021-08-31 ENCOUNTER — Encounter: Payer: Self-pay | Admitting: Internal Medicine

## 2021-08-31 ENCOUNTER — Ambulatory Visit: Payer: Self-pay | Attending: Internal Medicine | Admitting: Internal Medicine

## 2021-08-31 ENCOUNTER — Other Ambulatory Visit: Payer: Self-pay

## 2021-08-31 DIAGNOSIS — R0609 Other forms of dyspnea: Secondary | ICD-10-CM

## 2021-08-31 DIAGNOSIS — I10 Essential (primary) hypertension: Secondary | ICD-10-CM

## 2021-08-31 DIAGNOSIS — Z23 Encounter for immunization: Secondary | ICD-10-CM

## 2021-08-31 DIAGNOSIS — N3941 Urge incontinence: Secondary | ICD-10-CM

## 2021-08-31 DIAGNOSIS — R0600 Dyspnea, unspecified: Secondary | ICD-10-CM

## 2021-08-31 DIAGNOSIS — E1169 Type 2 diabetes mellitus with other specified complication: Secondary | ICD-10-CM

## 2021-08-31 DIAGNOSIS — Z1211 Encounter for screening for malignant neoplasm of colon: Secondary | ICD-10-CM

## 2021-08-31 LAB — POCT GLYCOSYLATED HEMOGLOBIN (HGB A1C): HbA1c, POC (controlled diabetic range): 6 % (ref 0.0–7.0)

## 2021-08-31 LAB — GLUCOSE, POCT (MANUAL RESULT ENTRY): POC Glucose: 103 mg/dl — AB (ref 70–99)

## 2021-08-31 MED ORDER — METFORMIN HCL 500 MG PO TABS
500.0000 mg | ORAL_TABLET | Freq: Every day | ORAL | 1 refills | Status: DC
  Filled 2021-08-31: qty 90, 90d supply, fill #0

## 2021-08-31 MED ORDER — SOLIFENACIN SUCCINATE 5 MG PO TABS
5.0000 mg | ORAL_TABLET | Freq: Every day | ORAL | 4 refills | Status: DC
  Filled 2021-08-31: qty 30, 30d supply, fill #0
  Filled 2021-09-24: qty 30, 30d supply, fill #1
  Filled 2021-10-25: qty 30, 30d supply, fill #2
  Filled 2021-11-23: qty 30, 30d supply, fill #3
  Filled 2021-12-26: qty 30, 30d supply, fill #4
  Filled 2021-12-26: qty 30, 30d supply, fill #0

## 2021-08-31 MED ORDER — HYDRALAZINE HCL 50 MG PO TABS
ORAL_TABLET | ORAL | 3 refills | Status: DC
  Filled 2021-08-31: qty 45, 30d supply, fill #0
  Filled 2021-10-04: qty 45, 30d supply, fill #1
  Filled 2021-10-29: qty 45, 30d supply, fill #2
  Filled 2021-11-23: qty 45, 30d supply, fill #3
  Filled 2021-11-23: qty 90, 30d supply, fill #3
  Filled 2022-02-04: qty 90, 30d supply, fill #4
  Filled 2022-02-04: qty 90, 30d supply, fill #0
  Filled 2022-04-14: qty 45, 15d supply, fill #1

## 2021-08-31 MED ORDER — LOSARTAN POTASSIUM-HCTZ 100-25 MG PO TABS
1.0000 | ORAL_TABLET | Freq: Every day | ORAL | 3 refills | Status: DC
  Filled 2021-08-31: qty 30, 30d supply, fill #0
  Filled 2021-10-10: qty 30, 30d supply, fill #1
  Filled 2021-11-06: qty 30, 30d supply, fill #2
  Filled 2021-12-10: qty 30, 30d supply, fill #3
  Filled 2022-01-13: qty 30, 30d supply, fill #4
  Filled 2022-01-14: qty 30, 30d supply, fill #0
  Filled 2022-02-06: qty 30, 30d supply, fill #1
  Filled 2022-03-19: qty 30, 30d supply, fill #2
  Filled 2022-04-14: qty 30, 30d supply, fill #3
  Filled 2022-05-10: qty 30, 30d supply, fill #4
  Filled 2022-06-13: qty 30, 30d supply, fill #5
  Filled 2022-07-16: qty 30, 30d supply, fill #6

## 2021-08-31 NOTE — Patient Instructions (Signed)

## 2021-08-31 NOTE — Progress Notes (Signed)
Patient ID: Linda Cooper, female    DOB: 31-May-1961  MRN: [id number]  CC: chronic ds management  Subjective: Linda Cooper is a 60 y.o. female who presents for chronic ds management Her concerns today include:  Hx of HTN, DM, morbid obesity and OA knees  DIABETES TYPE 2 Last A1C:   Results for orders placed or performed in visit on 08/31/21  POCT glucose (manual entry)  Result Value Ref Range   POC Glucose 103 (A) 70 - 99 mg/dl  POCT glycosylated hemoglobin (Hb A1C)  Result Value Ref Range   Hemoglobin A1C     HbA1c POC (<> result, manual entry)     HbA1c, POC (prediabetic range)     HbA1c, POC (controlled diabetic range) 6.0 0.0 - 7.0 %    Med Adherence:  _0  Yes -taking Metformin 500 mg once a day.  PA wrote last rxn as BID inadvertantly   _1  No Medication side effects:  _2  Yes    _3  No Home Monitoring?  _4  Yes a few times a wk    _5  No Home glucose results range: 110-124 Diet Adherence: _6  Yes    _7  No - a lot of "stress eating" and not cooking as much as she should.   Gained 13 lbs since 10/2020 Exercise: _8  Yes    _9  No - not able to do much due to severe OA knees Hypoglycemic episodes?: _10  Yes    _11  No Numbness of the feet? _12  Yes    _13  No Retinopathy hx? _14  Yes    _15  No Last eye exam: over due.  No insurance Comments:   Has appt with dentist 1-2 wks to have tooth pulled.  Had rheumatic fever age 51. Thinks she needs pre-extraction abx  HTN:  took meds already today. Checks BP regularly.  Has been 140/90s when she was in pain.  This a.m was in the 130s/80s Reports some SOB when going up steps and walking long distance.  No CP or PND/orthopnea  When has urge to urinate she has to go right away to prevent incontinence.  This has been going on for years ever since her hysterectomy. Using Azo daily which helps.  Use to be on Detrol and was doing Kegals but not very helpful.   HM: due for flu shot. Still has cologuard kit.  Received letter that kit has  expired.  Agrees for FIT. Due for COVID booster. Due for shingrix.  She will get this on her next visit. Patient Active Problem List   Diagnosis Date Noted   Pain, dental 08/16/2021   Type 2 diabetes mellitus with hyperglycemia, without long-term current use of insulin ([place]) 06/22/2020   Trigger middle finger of left hand 11/08/2019   Carpal tunnel syndrome of left wrist 07/09/2019   Morbid obesity with body mass index (BMI) of 50.0 to 59.9 in adult [facility]) 10/16/2018   Morbid obesity ([place]) 11/11/2014   PCOS (polycystic ovarian syndrome) 11/10/2014   Degenerative arthritis of knee, bilateral 11/10/2014   Diabetes mellitus without complication (HCC)    Elevated blood pressure reading in office with diagnosis of hypertension      Current Outpatient Medications on File Prior to Visit  Medication Sig Dispense Refill   Blood Glucose Monitoring Suppl (CONTOUR NEXT MONITOR) w/Device KIT 1 Units by Does not apply route daily. 1 kit 0   glucose blood test strip USE AS INSTRUCTED ONCE DAILY 50 strip 12   MICROLET LANCETS MISC Use as directed once daily  100 each 12   Multiple Vitamins-Minerals (MULTIVITAMIN WITH MINERALS) tablet Take 1 tablet by mouth daily.     naproxen (NAPROSYN) 500 MG tablet Take 1 tablet (500 mg total) by mouth 2 (two) times daily. 60 tablet 0   NON FORMULARY SEGA PRO BLADDER HEALTH 100 MG DAILY     Vitamin D, Ergocalciferol, (DRISDOL) 1.25 MG (50000 UT) CAPS capsule Take 1 capsule (50,000 Units total) by mouth every 7 (seven) days. 4 capsule 0   albuterol (VENTOLIN HFA) 108 (90 Base) MCG/ACT inhaler Inhale 2 puffs into the lungs every 4 (four) hours as needed for wheezing. (Patient not taking: Reported on 08/31/2021) 18 g 0   No current facility-administered medications on file prior to visit.    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    Son is 74.       She is motivated to lose weight and improve health by the expected death of her father  01-18-2017) and the sudden deaths of her cousin (found dead in car one month after death of his wife 2017-03-18)  and his wife (died after childbirth 02/18/17)    Exercise: minimal    Social Determinants of Health   Financial Resource Strain: Not on file  Food Insecurity: Not on file  Transportation Needs: Not on file  Physical Activity: Not on file  Stress: Not on file  Social Connections: Not on file  Intimate Partner Violence: Not on file    Family History  Problem Relation Age of Onset   Hypertension Mother    Diabetes Mother    Stroke Mother    Heart disease Mother    Obesity Mother    Hypertension Father    Diabetes Father    Stroke Father    Obesity Father    Heart disease Father    Hyperlipidemia Father    Diabetes Brother    Hypertension Brother    Gout Brother    Heart disease Sister    Breast cancer Cousin    Cancer Neg Hx     Past Surgical History:  Procedure Laterality Date   ABDOMINAL HYSTERECTOMY  2005/02/18    HERNIA REPAIR  6644    Umbilical     ROS: Review of Systems Negative except as stated above  PHYSICAL EXAM: BP 135/82   Pulse 87   Resp 16   Wt 294 lb 12.8 oz (133.7 kg)   SpO2 98%   BMI 53.92 kg/m   Wt Readings from Last 3 Encounters:  08/31/21 294 lb 12.8 oz (133.7 kg)  08/16/21 295 lb (133.8 kg)  10/23/20 281 lb  3.2 oz (127.6 kg)    Physical Exam  General appearance - alert, well appearing, morbidly obese older African-American female and in no distress.  She ambulates with a cane. Mental status - normal mood, behavior, speech, dress, motor activity, and thought processes Neck - supple, no significant adenopathy Chest - clear to auscultation, no wheezes, rales or rhonchi, symmetric air entry Heart - normal rate, regular rhythm, normal S1, S2, no murmurs, rubs, clicks or gallops Extremities - peripheral pulses normal, no pedal edema, no clubbing or cyanosis   CMP Latest Ref Rng & Units 04/26/2021 10/23/2020 11/11/2019  Glucose 65 - 99 mg/dL 101(H) 100(H) 112(H)  BUN 8 - 27 mg/dL _0 Creatinine 0.57 - 1.00 mg/dL 0.72 0.74 0.78  Sodium 134 - 144 mmol/L 140 140 140  Potassium 3.5 - 5.2 mmol/L 4.4 4.2 4.3  Chloride 96 - 106 mmol/L 102 101 101  CO2 20 - 29 mmol/L _1 Calcium 8.7 - 10.3 mg/dL 9.2 9.9 9.4  Total Protein 6.0 - 8.5 g/dL 6.5 6.8 6.8  Total Bilirubin 0.0 - 1.2 mg/dL 0.6 0.6 0.9  Alkaline Phos 44 - 121 IU/L 99 84 94  AST 0 - 40 IU/L _2 ALT 0 - 32 IU/L _3 Lipid Panel     Component Value Date/Time   CHOL 152 04/26/2021 1138   TRIG 100 04/26/2021 1138   HDL 65 04/26/2021 1138   CHOLHDL 2.3 04/26/2021 1138   CHOLHDL 2.0 11/10/2014 1148   VLDL 21 11/10/2014 1148   LDLCALC 69 04/26/2021 1138    CBC    Component Value Date/Time   WBC 9.7 10/23/2020 1621   WBC 9.1 03/02/2018 1625   RBC 4.72 10/23/2020 1621   RBC 4.89 03/02/2018 1625   HGB 12.4 10/23/2020 1621   HCT 37.8 10/23/2020 1621   PLT 418 10/23/2020 1621   MCV 80 10/23/2020 1621   MCH 26.3 (L) 10/23/2020 1621   MCH 25.6 (L) 03/02/2018 1625   MCHC 32.8 10/23/2020 1621   MCHC 32.6 03/02/2018 1625   RDW 15.7 (H) 10/23/2020 1621   LYMPHSABS 3.0 12/29/2018 1546   EOSABS 0.4 12/29/2018 1546   BASOSABS 0.1 12/29/2018 1546    ASSESSMENT AND PLAN:  1. Type 2 diabetes mellitus with morbid  obesity (HCC) A1c is at goal.  She will continue with metformin 1 tablet daily. -Strongly advised that she try to control her eating and try better meal planning. Get eye exam when she is able to afford. - POCT glucose (manual entry) - POCT glycosylated hemoglobin (Hb A1C) - metFORMIN (GLUCOPHAGE) 500 MG tablet; Take 1 tablet (500 mg total) by mouth daily with breakfast.  Dispense: 90 tablet; Refill: 1  2. Essential hypertension Close to goal.  Continue current medications and low-salt diet - hydrALAZINE (APRESOLINE) 50 MG tablet; TAKE 1.5 TABLETS (75 MG TOTAL) BY MOUTH 2 (TWO) TIMES DAILY.  Dispense: 90 tablet; Refill: 3 - losartan-hydrochlorothiazide (HYZAAR) 100-25 MG tablet; TAKE 1 TABLET BY MOUTH DAILY.  Dispense: 90 tablet; Refill: 3  3. DOE (dyspnea on exertion) Most likely due to deconditioning from obesity.  However we will check BNP to screen for CHF. - Brain natriuretic peptide  4. Urge incontinence Will give a trial of Vesicare. - solifenacin (VESICARE) 5 MG tablet; Take 1 tablet (5 mg total) by mouth daily.  Dispense: 30 tablet; Refill: 4  5. Screening for colon cancer - Fecal occult blood, imunochemical([facility])  6. Need for immunization against influenza - Flu Vaccine QUAD 31moIM (Fluarix, Fluzone &  Alfiuria Quad PF)   Patient was given the opportunity to ask questions.  Patient verbalized understanding of the plan and was able to repeat key elements of the plan.   Orders Placed This Encounter  Procedures   Fecal occult blood, imunochemical([facility])   Flu Vaccine QUAD 22moIM (Fluarix, Fluzone & Alfiuria Quad PF)   Brain natriuretic peptide   POCT glucose (manual entry)   POCT glycosylated hemoglobin (Hb A1C)     Requested Prescriptions   Signed Prescriptions Disp Refills   hydrALAZINE (APRESOLINE) 50 MG tablet 90 tablet 3    Sig: TAKE 1.5 TABLETS (75 MG TOTAL) BY MOUTH 2 (TWO) TIMES DAILY.   losartan-hydrochlorothiazide (HYZAAR) 100-25 MG  tablet 90 tablet 3    Sig: TAKE 1 TABLET BY MOUTH DAILY.   solifenacin (VESICARE) 5 MG tablet 30 tablet 4    Sig: Take 1 tablet (5 mg total) by mouth daily.   metFORMIN (GLUCOPHAGE) 500 MG tablet 90 tablet 1    Sig: Take 1 tablet (500 mg total) by mouth daily with breakfast.    Return in about 4 months (around 12/31/2021).  DKarle Plumber MD, FACP

## 2021-09-01 LAB — BRAIN NATRIURETIC PEPTIDE: BNP: 15.1 pg/mL (ref 0.0–100.0)

## 2021-09-12 LAB — FECAL OCCULT BLOOD, IMMUNOCHEMICAL: Fecal Occult Bld: NEGATIVE

## 2021-09-24 ENCOUNTER — Other Ambulatory Visit: Payer: Self-pay

## 2021-09-24 MED FILL — Glucose Blood Test Strip: 50 days supply | Qty: 50 | Fill #0 | Status: AC

## 2021-09-28 ENCOUNTER — Other Ambulatory Visit: Payer: Self-pay

## 2021-10-04 ENCOUNTER — Other Ambulatory Visit: Payer: Self-pay

## 2021-10-05 ENCOUNTER — Other Ambulatory Visit: Payer: Self-pay

## 2021-10-10 ENCOUNTER — Other Ambulatory Visit: Payer: Self-pay

## 2021-10-11 ENCOUNTER — Other Ambulatory Visit: Payer: Self-pay

## 2021-10-25 ENCOUNTER — Other Ambulatory Visit: Payer: Self-pay

## 2021-10-29 ENCOUNTER — Other Ambulatory Visit: Payer: Self-pay

## 2021-10-30 ENCOUNTER — Other Ambulatory Visit: Payer: Self-pay

## 2021-11-06 ENCOUNTER — Other Ambulatory Visit: Payer: Self-pay

## 2021-11-09 ENCOUNTER — Other Ambulatory Visit: Payer: Self-pay

## 2021-11-23 ENCOUNTER — Other Ambulatory Visit: Payer: Self-pay

## 2021-11-26 ENCOUNTER — Other Ambulatory Visit: Payer: Self-pay

## 2021-12-10 ENCOUNTER — Other Ambulatory Visit: Payer: Self-pay

## 2021-12-12 ENCOUNTER — Telehealth: Payer: Self-pay | Admitting: Nurse Practitioner

## 2021-12-12 ENCOUNTER — Ambulatory Visit: Payer: Self-pay

## 2021-12-12 DIAGNOSIS — U071 COVID-19: Secondary | ICD-10-CM

## 2021-12-12 MED ORDER — MOLNUPIRAVIR EUA 200MG CAPSULE
4.0000 | ORAL_CAPSULE | Freq: Two times a day (BID) | ORAL | 0 refills | Status: AC
  Filled 2021-12-12 – 2021-12-13 (×2): qty 40, 5d supply, fill #0

## 2021-12-12 NOTE — Patient Instructions (Signed)
You are being prescribed MOLNUPIRAVIR for COVID-19 infection.  ° ° °Please call the pharmacy or go through the drive through vs going inside if you are picking up the mediation yourself to prevent further spread. If prescribed to a [facility] affiliated pharmacy, a pharmacist will bring the medication out to your car. ° ° °ADMINISTRATION INSTRUCTIONS: °Take with or without food. Swallow the tablets whole. Don't chew, crush, or break the medications because it might not work as well ° °For each dose of the medication, you should be taking FOUR tablets at one time, TWICE a day  ° °Finish your full five-day course of Molnupiravir even if you feel better before you're done. Stopping this medication too early can make it less effective to prevent severe illness related to COVID19.   ° °Molnupiravir is prescribed for YOU ONLY. Don't share it with others, even if they have similar symptoms as you. This medication might not be right for everyone.  ° °Make sure to take steps to protect yourself and others while you're taking this medication in order to get well soon and to prevent others from getting sick with COVID-19. ° ° °**If you are of childbearing potential (any gender) - it is advised to not get pregnant while taking this medication and recommended that condoms are used for female partners the next 3 months after taking the medication out of extreme caution  ° ° °COMMON SIDE EFFECTS: °Diarrhea °Nausea  °Dizziness ° ° ° °If your COVID-19 symptoms get worse, get medical help right away. Call 911 if you experience symptoms such as worsening cough, trouble breathing, chest pain that doesn't go away, confusion, a hard time staying awake, and pale or blue-colored skin. °This medication won't prevent all COVID-19 cases from getting worse.  ° ° °

## 2021-12-12 NOTE — Telephone Encounter (Signed)
°  Chief Complaint: COVID Symptoms: cough, fatigue, chills Frequency: 1-2 days Pertinent Negatives: Patient denies SOB Disposition: [] ED /[] Urgent Care (no appt availability in office) / [] Appointment(In office/virtual)/ [x]  [facility]/ [] Home Care/ [] Refused Recommended Disposition /[] [facility]/ []  Follow-up with PCP Additional Notes: Pt was scheduled for virtual UC visit and advised to continue taking OTC meds to help with symptoms. Care advice given and pt verbalized understanding. No other questions/concerns noted.     Reason for Disposition  [1] Fever returns after gone for over 24 hours AND [2] symptoms worse or not improved  Answer Assessment - Initial Assessment Questions 1. COVID-19 DIAGNOSIS: "Who made your COVID-19 diagnosis?" "Was it confirmed by a positive lab test or self-test?" If not diagnosed by a doctor (or NP/PA), ask "Are there lots of cases (community spread) where you live?" Note: See public health department website, if unsure.     Home test  3. ONSET: "When did the COVID-19 symptoms start?"      yesterday 5. COUGH: "Do you have a cough?" If Yes, ask: "How bad is the cough?"       yes 6. FEVER: "Do you have a fever?" If Yes, ask: "What is your temperature, how was it measured, and when did it start?"     NO 7. RESPIRATORY STATUS: "Describe your breathing?" (e.g., shortness of breath, wheezing, unable to speak)      No 10. VACCINE: "Have you had the COVID-19 vaccine?" If Yes, ask: "Which one, how many shots, when did you get it?"       Yes 11. BOOSTER: "Have you received your COVID-19 booster?" If Yes, ask: "Which one and when did you get it?"       No 13. OTHER SYMPTOMS: "Do you have any other symptoms?"  (e.g., chills, fatigue, headache, loss of smell or taste, muscle pain, sore throat)       Cough, fatigue,  Protocols used: Coronavirus (COVID-19) Diagnosed or Suspected-A-AH

## 2021-12-12 NOTE — Progress Notes (Signed)
° °Virtual Visit Consent  ° °Linda Cooper, you are scheduled for a virtual visit with Mary-Margaret Martin, FNP, a [facility] provider, today.   °  °Just as with appointments in the office, your consent must be obtained to participate.  Your consent will be active for this visit and any virtual visit you may have with one of our providers in the next 365 days.   °  °If you have a MyChart account, a copy of this consent can be sent to you electronically.  All virtual visits are billed to your insurance company just like a traditional visit in the office.   ° °As this is a virtual visit, video technology does not allow for your provider to perform a traditional examination.  This may limit your provider's ability to fully assess your condition.  If your provider identifies any concerns that need to be evaluated in person or the need to arrange testing (such as labs, EKG, etc.), we will make arrangements to do so.   °  °Although advances in technology are sophisticated, we cannot ensure that it will always work on either your end or our end.  If the connection with a video visit is poor, the visit may have to be switched to a telephone visit.  With either a video or telephone visit, we are not always able to ensure that we have a secure connection.    ° °I need to obtain your verbal consent now.   Are you willing to proceed with your visit today? YES °  °Linda Cooper has provided verbal consent on 12/12/2021 for a virtual visit (video or telephone).lost video connection during visit. °Mary-Margaret Martin, FNP  ° °Date: 12/12/2021 3:37 PM ° ° °Virtual Visit via Video Note  ° °I, Mary-Margaret Martin, connected with Linda Cooper ([id number], 03/02/1961) on 12/12/21 at  3:45 PM EST by a video-enabled telemedicine application and verified that I am speaking with the correct person using two identifiers. ° °Location: °Patient: Virtual Visit Location Patient: Home °Provider: Virtual Visit Location Provider:  Mobile °  °I discussed the limitations of evaluation and management by telemedicine and the availability of in person appointments. The patient expressed understanding and agreed to proceed.   ° °History of Present Illness: °Linda Cooper is a 60 y.o. who identifies as a female who was assigned female at birth, and is being seen today for covid positive. ° °HPI: URI  °This is a new problem. The current episode started yesterday. The problem has been gradually worsening. There has been no fever (chills). Associated symptoms include congestion, coughing, headaches and rhinorrhea. Pertinent negatives include no sore throat. She has tried decongestant for the symptoms. The treatment provided mild relief.  Tested positive for covid this morning °Review of Systems  °HENT:  Positive for congestion and rhinorrhea. Negative for sore throat.   °Respiratory:  Positive for cough.   °Neurological:  Positive for headaches.  ° °Problems:  °Patient Active Problem List  ° Diagnosis Date Noted  ° Urge incontinence 08/31/2021  ° Pain, dental 08/16/2021  ° Type 2 diabetes mellitus with hyperglycemia, without long-term current use of insulin (HCC) 06/22/2020  ° Trigger middle finger of left hand 11/08/2019  ° Carpal tunnel syndrome of left wrist 07/09/2019  ° Morbid obesity with body mass index (BMI) of 50.0 to 59.9 in adult (HCC) 10/16/2018  ° Morbid obesity (HCC) 11/11/2014  ° PCOS (polycystic ovarian syndrome) 11/10/2014  ° Degenerative arthritis of knee, bilateral 11/10/2014  °   Diabetes mellitus without complication (HCC)    Elevated blood pressure reading in office with diagnosis of hypertension     Allergies:  Allergies  Allergen Reactions   Ace Inhibitors Cough   Erythromycin Base     Cramping in abdomen is reaction/no rash   Medications:  Current Outpatient Medications:    albuterol (VENTOLIN HFA) 108 (90 Base) MCG/ACT inhaler, Inhale 2 puffs into the lungs every 4 (four) hours as needed for wheezing. (Patient  not taking: Reported on 08/31/2021), Disp: 18 g, Rfl: 0   Blood Glucose Monitoring Suppl (CONTOUR NEXT MONITOR) w/Device KIT, 1 Units by Does not apply route daily., Disp: 1 kit, Rfl: 0   hydrALAZINE (APRESOLINE) 50 MG tablet, TAKE 1.5 TABLETS (75 MG TOTAL) BY MOUTH 2 (TWO) TIMES DAILY., Disp: 90 tablet, Rfl: 3   losartan-hydrochlorothiazide (HYZAAR) 100-25 MG tablet, TAKE 1 TABLET BY MOUTH DAILY., Disp: 90 tablet, Rfl: 3   metFORMIN (GLUCOPHAGE) 500 MG tablet, Take 1 tablet (500 mg total) by mouth daily with breakfast., Disp: 90 tablet, Rfl: 1   MICROLET LANCETS MISC, Use as directed once daily, Disp: 100 each, Rfl: 12   Multiple Vitamins-Minerals (MULTIVITAMIN WITH MINERALS) tablet, Take 1 tablet by mouth daily., Disp: , Rfl:    naproxen (NAPROSYN) 500 MG tablet, Take 1 tablet (500 mg total) by mouth 2 (two) times daily., Disp: 60 tablet, Rfl: 0   NON FORMULARY, SEGA PRO BLADDER HEALTH 100 MG DAILY, Disp: , Rfl:    solifenacin (VESICARE) 5 MG tablet, Take 1 tablet (5 mg total) by mouth daily., Disp: 30 tablet, Rfl: 4   Vitamin D, Ergocalciferol, (DRISDOL) 1.25 MG (50000 UT) CAPS capsule, Take 1 capsule (50,000 Units total) by mouth every 7 (seven) days., Disp: 4 capsule, Rfl: 0  Observations/Objective: Patient is well-developed, well-nourished in no acute distress.  Resting comfortably  at home.  Head is normocephalic, atraumatic.  No labored breathing.  Speech is clear and coherent with logical content.  Patient is alert and oriented at baseline.  Voice hoarse  Assessment and Plan:  Linda Cooper in today with chief complaint of Covid Positive   1. Lab test positive for detection of COVID-19 virus 1. Take meds as prescribed 2. Use a cool mist humidifier especially during the winter months and when heat has been humid. 3. Use saline nose sprays frequently 4. Saline irrigations of the nose can be very helpful if done frequently.  * 4X daily for 1 week*  * Use of a nettie pot can  be helpful with this. Follow directions with this* 5. Drink plenty of fluids 6. Keep thermostat turn down low 7.For any cough or congestion- mucinex or delsym 8. For fever or aces or pains- take tylenol or ibuprofen appropriate for age and weight.  * for fevers greater than 101 orally you may alternate ibuprofen and tylenol every  3 hours.   Meds ordered this encounter  Medications   molnupiravir EUA (LAGEVRIO) 200 mg CAPS capsule    Sig: Take 4 capsules (800 mg total) by mouth 2 (two) times daily for 5 days.    Dispense:  40 capsule    Refill:  0    Order Specific Question:   Supervising Provider    Answer:   Noemi Chapel [[id number]]        Follow Up Instructions: I discussed the assessment and treatment plan with the patient. The patient was provided an opportunity to ask questions and all were answered. The patient agreed with the plan and  demonstrated an understanding of the instructions.  A copy of instructions were sent to the patient via MyChart. ° °The patient was advised to call back or seek an in-person evaluation if the symptoms worsen or if the condition fails to improve as anticipated. ° °Time:  °I spent 12 minutes with the patient via telehealth technology discussing the above problems/concerns.   ° °Mary-Margaret Martin, FNP ° ° °

## 2021-12-13 ENCOUNTER — Other Ambulatory Visit (HOSPITAL_COMMUNITY): Payer: Self-pay

## 2021-12-13 ENCOUNTER — Other Ambulatory Visit: Payer: Self-pay

## 2021-12-15 ENCOUNTER — Other Ambulatory Visit: Payer: Self-pay

## 2021-12-18 ENCOUNTER — Ambulatory Visit: Payer: Self-pay

## 2021-12-18 NOTE — Telephone Encounter (Signed)
Pt called, she is asking if she can go back to work since she has finished medication and quarantine but still tested positive and still has cough. Advised pt at this point she shouldn't still be contagious but it would be up to her employer if they are ok with her returning. Advised pt to make sure she doesn't have a fever if and when she does go back. Pt verbalized understanding.     Summary: COVID positive/Asking if she cna go back to work   Pt calling stated she finished medication molnupiravir EUA (LAGEVRIO) 200 mg CAPS capsule. Pt stated she is still positive for COVID she tested positive again today.  Pt stated her symptoms are only cough and feels very tired. Pt seeking clinical advice she is wanting to know if she can go back to work.   Please advise.      Reason for Disposition  General information question, no triage required and triager able to answer question  Answer Assessment - Initial Assessment Questions 1. REASON FOR CALL or QUESTION: "What is your reason for calling today?" or "How can I best help you?" or "What question do you have that I can help answer?"     Pt is wanting to know if she can go back to work  Protocols used: Information Only Call - No Triage-A-AH

## 2021-12-26 ENCOUNTER — Other Ambulatory Visit: Payer: Self-pay

## 2021-12-28 ENCOUNTER — Other Ambulatory Visit: Payer: Self-pay

## 2021-12-31 ENCOUNTER — Encounter: Payer: Self-pay | Admitting: Internal Medicine

## 2021-12-31 ENCOUNTER — Other Ambulatory Visit: Payer: Self-pay

## 2021-12-31 ENCOUNTER — Ambulatory Visit: Payer: 59 | Attending: Internal Medicine | Admitting: Internal Medicine

## 2021-12-31 DIAGNOSIS — I10 Essential (primary) hypertension: Secondary | ICD-10-CM | POA: Diagnosis not present

## 2021-12-31 DIAGNOSIS — E1169 Type 2 diabetes mellitus with other specified complication: Secondary | ICD-10-CM | POA: Diagnosis not present

## 2021-12-31 DIAGNOSIS — N3941 Urge incontinence: Secondary | ICD-10-CM | POA: Diagnosis not present

## 2021-12-31 DIAGNOSIS — M170 Bilateral primary osteoarthritis of knee: Secondary | ICD-10-CM

## 2021-12-31 DIAGNOSIS — Z1231 Encounter for screening mammogram for malignant neoplasm of breast: Secondary | ICD-10-CM

## 2021-12-31 DIAGNOSIS — Z6841 Body Mass Index (BMI) 40.0 and over, adult: Secondary | ICD-10-CM

## 2021-12-31 LAB — POCT GLYCOSYLATED HEMOGLOBIN (HGB A1C): HbA1c, POC (controlled diabetic range): 6.2 % (ref 0.0–7.0)

## 2021-12-31 MED ORDER — METFORMIN HCL ER 500 MG PO TB24
500.0000 mg | ORAL_TABLET | Freq: Every day | ORAL | 4 refills | Status: DC
  Filled 2021-12-31 – 2022-01-15 (×2): qty 30, 30d supply, fill #0
  Filled 2022-02-06: qty 30, 30d supply, fill #1
  Filled 2022-03-19: qty 30, 30d supply, fill #2
  Filled 2022-04-14: qty 30, 30d supply, fill #3
  Filled 2022-05-10: qty 30, 30d supply, fill #4

## 2021-12-31 NOTE — Progress Notes (Signed)
Patient ID: Linda Cooper, female    DOB: 05-20-1961  MRN: [id number]  CC: Diabetes, Hypertension, and Knee Pain (B/l)   Subjective: Linda Cooper is a 61 y.o. female who presents for chronic ds management Her concerns today include:  Hx of HTN, DM, morbid obesity and OA knees,   Got COVID in the new yr. Has not had COVID booster.  Wants to get it.  DIABETES TYPE 2 Last A1C:   Results for orders placed or performed in visit on 12/31/21  POCT glycosylated hemoglobin (Hb A1C)  Result Value Ref Range   Hemoglobin A1C     HbA1c POC (<> result, manual entry)     HbA1c, POC (prediabetic range)     HbA1c, POC (controlled diabetic range) 6.2 0.0 - 7.0 %    Med Adherence:  _0  Yes -Metformin 500 mg daily.  Would like to get back on Metformin ER.  Feels it worked better for her and longer acting.  Medication side effects:  _1  Yes    _2  No Home Monitoring?  _3  Yes several times a wk.  She has a log     Home glucose results range:1109-131 Diet Adherence: _4  Yes -doing better but still craves candies.  Joined Wgh Wat[employer] the end of November.  Had loss up to 11 lbs but healthy eating fell off when she got COVID.  Started [employer] Watchers at 305 lbs. Today she is 297 lbs.  Eating wraps from [place] for lunch Exercise: able to move a little more with the wgh loss.  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 Hypoglycemic episodes?: _5  Yes    _6  No Numbness of the feet? _7  Yes    _8  No Retinopathy hx? _9  Yes    _10  No Last eye exam:  Comments:   HTN:  forgot to take meds this a.m before coming.   Checks BP 2-3x/wk.  Reports it has been running good.  140-150/90 before meds; 120s/70-80s after meds.    OA knees:  now that she has insurance would like referral to ortho.  She was seeing Dr. Jeri Lager with [facility].  Not in network for her so will need referral to different ortho.  OAB:  started on Vesicare on last visit.  Reports it has helped Patient Active Problem List    Diagnosis Date Noted   Urge incontinence 08/31/2021   Pain, dental 08/16/2021   Type 2 diabetes mellitus with hyperglycemia, without long-term current use of insulin ([place]) 06/22/2020   Trigger middle finger of left hand 11/08/2019   Carpal tunnel syndrome of left wrist 07/09/2019   Morbid obesity with body mass index (BMI) of 50.0 to 59.9 in adult [facility]) 10/16/2018   Morbid obesity ([place]) 11/11/2014   PCOS (polycystic ovarian syndrome) 11/10/2014   Degenerative arthritis of knee, bilateral 11/10/2014   Diabetes mellitus without complication (HCC)    Elevated blood pressure reading in office with diagnosis of hypertension      Current Outpatient Medications on File Prior to Visit  Medication Sig Dispense Refill   albuterol (VENTOLIN HFA) 108 (90 Base) MCG/ACT inhaler Inhale 2 puffs into the lungs every 4 (four) hours as needed for wheezing. (Patient not taking: Reported on 08/31/2021) 18 g 0   Blood Glucose Monitoring Suppl (CONTOUR NEXT MONITOR) w/Device KIT 1 Units by Does not apply route daily. 1 kit 0   hydrALAZINE (APRESOLINE) 50 MG tablet TAKE 1.5 TABLETS (75 MG TOTAL) BY MOUTH 2 (TWO) TIMES  DAILY. 90 tablet 3   losartan-hydrochlorothiazide (HYZAAR) 100-25 MG tablet TAKE 1 TABLET BY MOUTH DAILY. 90 tablet 3   MICROLET LANCETS MISC Use as directed once daily 100 each 12   Multiple Vitamins-Minerals (MULTIVITAMIN WITH MINERALS) tablet Take 1 tablet by mouth daily.     naproxen (NAPROSYN) 500 MG tablet Take 1 tablet (500 mg total) by mouth 2 (two) times daily. 60 tablet 0   NON FORMULARY SEGA PRO BLADDER HEALTH 100 MG DAILY     solifenacin (VESICARE) 5 MG tablet Take 1 tablet (5 mg total) by mouth daily. 30 tablet 4   Vitamin D, Ergocalciferol, (DRISDOL) 1.25 MG (50000 UT) CAPS capsule Take 1 capsule (50,000 Units total) by mouth every 7 (seven) days. 4 capsule 0   No current facility-administered medications on file prior to visit.    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    Son is 68.       She is motivated to lose weight and improve health by the expected death of her father  2017/01/23) and the sudden deaths of her cousin (found dead in car one month after death of his wife 03-23-2017)  and his wife (died after childbirth 02-23-17)    Exercise: minimal    Social Determinants of Health   Financial Resource Strain: Not on file  Food Insecurity: Not on file  Transportation Needs: Not on file  Physical Activity: Not on file  Stress: Not on file  Social Connections: Not on file  Intimate Partner Violence: Not on file    Family History  Problem Relation Age of Onset   Hypertension Mother    Diabetes Mother    Stroke Mother    Heart disease Mother    Obesity Mother    Hypertension Father    Diabetes Father    Stroke Father    Obesity Father    Heart disease Father    Hyperlipidemia Father    Diabetes Brother    Hypertension Brother    Gout Brother    Heart disease Sister    Breast cancer Cousin    Cancer Neg Hx     Past Surgical History:  Procedure Laterality Date   ABDOMINAL HYSTERECTOMY  02/23/05    HERNIA REPAIR  7412    Umbilical     ROS: Review of Systems Negative except as stated above  PHYSICAL EXAM: BP (!) 165/94    Pulse 80    Resp 16    Wt 297 lb (134.7 kg)    SpO2 100%    BMI 54.32  kg/m   Wt Readings from Last 3 Encounters:  12/31/21 297 lb (134.7 kg)  08/31/21  294 lb 12.8 oz (133.7 kg)  08/16/21 295 lb (133.8 kg)    Physical Exam  General appearance - alert, well appearing, and in no distress Mental status - normal mood, behavior, speech, dress, motor activity, and thought processes Neck - supple, no significant adenopathy Chest - clear to auscultation, no wheezes, rales or rhonchi, symmetric air entry Heart - normal rate, regular rhythm, normal S1, S2, no murmurs, rubs, clicks or gallops Extremities - trace LE edema   CMP Latest Ref Rng & Units 04/26/2021 10/23/2020 11/11/2019  Glucose 65 - 99 mg/dL 101(H) 100(H) 112(H)  BUN 8 - 27 mg/dL _0 Creatinine 0.57 - 1.00 mg/dL 0.72 0.74 0.78  Sodium 134 - 144 mmol/L 140 140 140  Potassium 3.5 - 5.2 mmol/L 4.4 4.2 4.3  Chloride 96 - 106 mmol/L 102 101 101  CO2 20 - 29 mmol/L _1 Calcium 8.7 - 10.3 mg/dL 9.2 9.9 9.4  Total Protein 6.0 - 8.5 g/dL 6.5 6.8 6.8  Total Bilirubin 0.0 - 1.2 mg/dL 0.6 0.6 0.9  Alkaline Phos 44 - 121 IU/L 99 84 94  AST 0 - 40 IU/L _2 ALT 0 - 32 IU/L _3 Lipid Panel     Component Value Date/Time   CHOL 152 04/26/2021 1138   TRIG 100 04/26/2021 1138   HDL 65 04/26/2021 1138   CHOLHDL 2.3 04/26/2021 1138   CHOLHDL 2.0 11/10/2014 1148   VLDL 21 11/10/2014 1148   LDLCALC 69 04/26/2021 1138    CBC    Component Value Date/Time   WBC 9.7 10/23/2020 1621   WBC 9.1 03/02/2018 1625   RBC 4.72 10/23/2020 1621   RBC 4.89 03/02/2018 1625   HGB 12.4 10/23/2020 1621   HCT 37.8 10/23/2020 1621   PLT 418 10/23/2020 1621   MCV 80 10/23/2020 1621   MCH 26.3 (L) 10/23/2020 1621   MCH 25.6 (L) 03/02/2018 1625   MCHC 32.8 10/23/2020 1621   MCHC 32.6 03/02/2018 1625   RDW 15.7 (H) 10/23/2020 1621   LYMPHSABS 3.0 12/29/2018 1546   EOSABS 0.4 12/29/2018 1546   BASOSABS 0.1 12/29/2018 1546    ASSESSMENT AND PLAN: 1. Type 2 diabetes mellitus with morbid obesity  ([place]) At goal.  Commended her on wgh loss so far and encourage her to keep up good work. Change Metformin to XR Discussed putting her on GLP1 to assist with wgh loss.  Pt does not want inj med at this time - POCT glycosylated hemoglobin (Hb A1C) - Ambulatory referral to Ophthalmology - metFORMIN (GLUCOPHAGE XR) 500 MG 24 hr tablet; Take 1 tablet (500 mg total) by mouth daily with breakfast.  Dispense: 30 tablet; Refill: 4  2. Essential hypertension Not at goal.  Did not take Hyzaar and Hydralazine as yet for the morning.   No changes made as pt reports BP at home at goal  3. Urge incontinence Continue Vesicare  4. Primary osteoarthritis of both knees - Ambulatory referral to Orthopedic Surgery  5. Encounter for screening mammogram for malignant neoplasm of breast - MM Digital Screening; Future    Patient was given the opportunity to ask questions.  Patient verbalized understanding of the plan and was able to repeat key elements of the plan.   Orders Placed This Encounter  Procedures   MM Digital Screening   Ambulatory referral to Orthopedic Surgery   Ambulatory referral to Ophthalmology   POCT glycosylated hemoglobin (Hb A1C)  Requested Prescriptions   Signed Prescriptions Disp Refills   metFORMIN (GLUCOPHAGE XR) 500 MG 24 hr tablet 30 tablet 4    Sig: Take 1 tablet (500 mg total) by mouth daily with breakfast.    Return in about 4 months (around 04/30/2022).  Karle Plumber, MD, FACP

## 2022-01-04 ENCOUNTER — Other Ambulatory Visit: Payer: Self-pay

## 2022-01-04 ENCOUNTER — Other Ambulatory Visit: Payer: Self-pay | Admitting: Internal Medicine

## 2022-01-04 DIAGNOSIS — Z1231 Encounter for screening mammogram for malignant neoplasm of breast: Secondary | ICD-10-CM

## 2022-01-11 ENCOUNTER — Ambulatory Visit: Payer: Self-pay

## 2022-01-11 ENCOUNTER — Other Ambulatory Visit: Payer: Self-pay

## 2022-01-11 ENCOUNTER — Encounter: Payer: Self-pay | Admitting: Orthopedic Surgery

## 2022-01-11 ENCOUNTER — Telehealth: Payer: Self-pay

## 2022-01-11 ENCOUNTER — Ambulatory Visit
Admission: RE | Admit: 2022-01-11 | Discharge: 2022-01-11 | Disposition: A | Discharge status: Home / Self Care | Payer: 59 | Source: Ambulatory Visit | Attending: Internal Medicine | Admitting: Internal Medicine

## 2022-01-11 ENCOUNTER — Ambulatory Visit (INDEPENDENT_AMBULATORY_CARE_PROVIDER_SITE_OTHER): Payer: 59

## 2022-01-11 ENCOUNTER — Ambulatory Visit: Payer: 59 | Admitting: Orthopedic Surgery

## 2022-01-11 VITALS — Ht 62.0 in | Wt 297.0 lb

## 2022-01-11 DIAGNOSIS — M25562 Pain in left knee: Secondary | ICD-10-CM

## 2022-01-11 DIAGNOSIS — M25561 Pain in right knee: Secondary | ICD-10-CM

## 2022-01-11 DIAGNOSIS — M170 Bilateral primary osteoarthritis of knee: Secondary | ICD-10-CM

## 2022-01-11 DIAGNOSIS — Z1231 Encounter for screening mammogram for malignant neoplasm of breast: Secondary | ICD-10-CM

## 2022-01-11 MED ORDER — METHYLPREDNISOLONE ACETATE 40 MG/ML IJ SUSP
40.0000 mg | INTRAMUSCULAR | Status: AC | PRN
  Administered 2022-01-11: 40 mg via INTRA_ARTICULAR

## 2022-01-11 MED ORDER — LIDOCAINE HCL 1 % IJ SOLN
5.0000 mL | INTRAMUSCULAR | Status: AC | PRN
  Administered 2022-01-11: 5 mL

## 2022-01-11 MED ORDER — BUPIVACAINE HCL 0.25 % IJ SOLN
4.0000 mL | INTRAMUSCULAR | Status: AC | PRN
  Administered 2022-01-11: 4 mL via INTRA_ARTICULAR

## 2022-01-11 NOTE — Telephone Encounter (Signed)
Auth needed for bilat knee gel injection  

## 2022-01-11 NOTE — Progress Notes (Signed)
Office Visit Note   Patient: Linda Cooper           Date of Birth: 1961-11-22           MRN: [id number] Visit Date: 01/11/2022 Requested by: Ladell Pier, MD [place],  [place] [id number] PCP: Ladell Pier, MD  Subjective: Chief Complaint  Patient presents with   Left Knee - Pain   Right Knee - Pain    HPI: Linda Cooper is a 61 y.o. female who presents to the office complaining of bilateral knee pain.  Patient complains of chronic knee pain.  Left knee bothers her more than her right.  Localizes pain to the medial aspect of both knees primarily.  No radiation of pain.  Pain does not wake her up at night.  She has about 5 to 10-minute walking endurance.  No history of prior surgery.  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  She struggles with getting up and down stairs.  She has decreased range of motion and notes her pain is worse with weightbearing, stairs, inclement weather.  She has had prior injections in both knees with some relief.  She uses KT tape, Biofreeze, Aleve twice per day, turmeric for management of her pain.  She has history of obesity and is currently on weight watchers to work on weight loss.  Also has history of diabetes with last A1c 6.2.  Ambulates with a cane..                ROS: All systems reviewed are negative as they relate to the chief complaint within the history of present illness.  Patient denies fevers or chills.  Assessment & Plan: Visit Diagnoses:  1. Pain in both knees, unspecified chronicity     Plan: Patient is a 61 year old female who presents for evaluation of bilateral knee pain.  She has severe bilateral knee osteoarthritis.  She is working on weight loss to help with her knee pain and to become a better surgical candidate.  Currently not a candidate for surgery due to her BMI.  She has had cortisone injections in the past with good relief and would like  to try this again today.  She would also like to try viscosupplementation injections to see if this will provide any relief.  Plan to preapproved patient for gel injections of bilateral knees.  She would like to try left knee cortisone injection today.  Tolerated the procedure well.  Follow-up with the office after gel injection approval.  This patient is diagnosed with osteoarthritis of the knee(s).    Radiographs show evidence of joint space narrowing, osteophytes, subchondral sclerosis and/or subchondral cysts.  This patient has knee pain which interferes with functional and activities of daily living.    This patient has experienced inadequate response, adverse effects and/or intolerance with conservative treatments such as acetaminophen, NSAIDS, topical creams, physical therapy or regular exercise, knee bracing and/or weight loss.   This patient has experienced inadequate response or has a contraindication to intra articular steroid injections for at least 3 months.   This patient is not scheduled to have a total knee replacement within 6 months of starting treatment with viscosupplementation.   Follow-Up Instructions: No follow-ups on file.   Orders:  Orders Placed This Encounter  Procedures   XR Knee 1-2 Views Right   XR Knee 1-2 Views Left   No orders of the defined  types were placed in this encounter.     Procedures: Large Joint Inj: L knee on 01/11/2022 11:34 AM Indications: diagnostic evaluation, joint swelling and pain Details: 18 G 1.5 in needle, superolateral approach  Arthrogram: No  Medications: 5 mL lidocaine 1 %; 40 mg methylPREDNISolone acetate 40 MG/ML; 4 mL bupivacaine 0.25 % Aspirate: 10 mL Outcome: tolerated well, no immediate complications Procedure, treatment alternatives, risks and benefits explained, specific risks discussed. Consent was given by the patient. Immediately prior to procedure a time out was called to verify the correct patient, procedure,  equipment, support staff and site/side marked as required. Patient was prepped and draped in the usual sterile fashion.      Clinical Data: No additional findings.  Objective: Vital Signs: Ht 5\' 2"  (1.575 m)    Wt 297 lb (134.7 kg)    BMI 54.32 kg/m   Physical Exam:  Constitutional: Patient appears well-developed HEENT:  Head: Normocephalic Eyes:EOM are normal Neck: Normal range of motion Cardiovascular: Normal rate Pulmonary/chest: Effort normal Neurologic: Patient is alert Skin: Skin is warm Psychiatric: Patient has normal mood and affect  Ortho Exam: Ortho exam demonstrates bilateral knees with crepitus noted passive motion of the knees.  Right knee with 10 degrees extension and 95 degrees of knee flexion.  Left knee with 5 degrees extension and 8 degrees of knee flexion.  Able to perform straight leg raise with good quad strength bilaterally.  Tenderness over the medial lateral joint lines, primarily over the medial joint line bilaterally.  No pain with hip range of motion.  Positive effusion of the left knee.  Specialty Comments:  No specialty comments available.  Imaging: No results found.   PMFS History: Patient Active Problem List   Diagnosis Date Noted   Urge incontinence 08/31/2021   Pain, dental 08/16/2021   Type 2 diabetes mellitus with hyperglycemia, without long-term current use of insulin ([place]) 06/22/2020   Trigger middle finger of left hand 11/08/2019   Carpal tunnel syndrome of left wrist 07/09/2019   Morbid obesity with body mass index (BMI) of 50.0 to 59.9 in adult ([place]) 10/16/2018   Morbid obesity ([place]) 11/11/2014   PCOS (polycystic ovarian syndrome) 11/10/2014   Degenerative arthritis of knee, bilateral 11/10/2014   Diabetes mellitus without complication (HCC)    Elevated blood pressure reading in office with diagnosis of hypertension    Past Medical History:  Diagnosis Date   'light-for-dates' infant with signs of fetal malnutrition     Anemia    Arthritis    Asthma    Diabetes mellitus without complication ([place]) Dx [id number]   GERD (gastroesophageal reflux disease)    Hypertension Dx 1990   Knee pain, bilateral    Obesity    Rheumatic fever childhood    has intermittent murmur and hand arthritis     Family History  Problem Relation Age of Onset   Hypertension Mother    Diabetes Mother    Stroke Mother    Heart disease Mother    Obesity Mother    Hypertension Father    Diabetes Father    Stroke Father    Obesity Father    Heart disease Father    Hyperlipidemia Father    Diabetes Brother    Hypertension Brother    Gout Brother    Heart disease Sister    Breast cancer Cousin    Cancer Neg Hx     Past Surgical History:  Procedure Laterality Date   ABDOMINAL HYSTERECTOMY  2006  HERNIA REPAIR  [id number]    Umbilical    Social History   Occupational History   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

## 2022-01-11 NOTE — Telephone Encounter (Signed)
Noted  

## 2022-01-14 ENCOUNTER — Other Ambulatory Visit: Payer: Self-pay

## 2022-01-15 ENCOUNTER — Other Ambulatory Visit: Payer: Self-pay

## 2022-01-28 ENCOUNTER — Other Ambulatory Visit: Payer: Self-pay | Admitting: Internal Medicine

## 2022-01-28 DIAGNOSIS — N3941 Urge incontinence: Secondary | ICD-10-CM

## 2022-01-29 ENCOUNTER — Other Ambulatory Visit: Payer: Self-pay

## 2022-01-29 MED ORDER — SOLIFENACIN SUCCINATE 5 MG PO TABS
5.0000 mg | ORAL_TABLET | Freq: Every day | ORAL | 4 refills | Status: DC
  Filled 2022-01-29: qty 30, 30d supply, fill #0
  Filled 2022-02-26: qty 30, 30d supply, fill #1
  Filled 2022-03-27: qty 30, 30d supply, fill #2
  Filled 2022-04-30: qty 30, 30d supply, fill #3
  Filled 2022-05-27: qty 30, 30d supply, fill #4

## 2022-01-29 NOTE — Telephone Encounter (Signed)
Requested Prescriptions  Pending Prescriptions Disp Refills   solifenacin (VESICARE) 5 MG tablet 30 tablet 4    Sig: Take 1 tablet (5 mg total) by mouth daily.     Urology:  Bladder Agents 2 Passed - 01/28/2022  9:32 AM      Passed - Cr in normal range and within 360 days    Creat  Date Value Ref Range Status  12/26/2014 0.77 0.50 - 1.10 mg/dL Final   Creatinine, Ser  Date Value Ref Range Status  04/26/2021 0.72 0.57 - 1.00 mg/dL Final   Creatinine, Urine  Date Value Ref Range Status  11/10/2014 28.9 mg/dL Final    Comment:    No reference range established.         Passed - ALT in normal range and within 360 days    ALT  Date Value Ref Range Status  04/26/2021 16 0 - 32 IU/L Final         Passed - AST in normal range and within 360 days    AST  Date Value Ref Range Status  04/26/2021 16 0 - 40 IU/L Final         Passed - Valid encounter within last 12 months    Recent Outpatient Visits          4 weeks ago Type 2 diabetes mellitus with morbid obesity (HCC)   [facility] [place], Gavin Pound B, MD   5 months ago Type 2 diabetes mellitus with morbid obesity [facility])   [facility] Marcine Matar, MD   9 months ago Vitamin D deficiency   [facility] [place], [place], [place]   1 year ago Need for influenza vaccination   [facility] Drucilla Chalet, RPH-CPP   1 year ago Type 2 diabetes mellitus with morbid obesity [facility])    [facility] Marcine Matar, MD

## 2022-02-04 ENCOUNTER — Other Ambulatory Visit: Payer: Self-pay

## 2022-02-06 ENCOUNTER — Other Ambulatory Visit: Payer: Self-pay

## 2022-02-07 ENCOUNTER — Other Ambulatory Visit: Payer: Self-pay

## 2022-02-13 ENCOUNTER — Telehealth: Payer: Self-pay | Admitting: Orthopedic Surgery

## 2022-02-13 NOTE — Telephone Encounter (Signed)
Patient called needing update on approval of the gel injection. The number to contact patient is [phone] ?

## 2022-02-13 NOTE — Telephone Encounter (Signed)
Talked with patient and advised her that her insurance does not cover gel injection.  Did advise her of TriVisc, but patient stated that she would think about it and call back. ?

## 2022-02-26 ENCOUNTER — Other Ambulatory Visit: Payer: Self-pay

## 2022-02-27 ENCOUNTER — Other Ambulatory Visit: Payer: Self-pay | Admitting: Internal Medicine

## 2022-02-27 ENCOUNTER — Other Ambulatory Visit: Payer: Self-pay

## 2022-02-27 DIAGNOSIS — E119 Type 2 diabetes mellitus without complications: Secondary | ICD-10-CM

## 2022-02-27 MED ORDER — CONTOUR NEXT TEST VI STRP
ORAL_STRIP | 1 refills | Status: AC
  Filled 2022-02-27: qty 25, 25d supply, fill #0
  Filled 2022-08-15: qty 25, 25d supply, fill #1
  Filled 2022-08-30: qty 50, 30d supply, fill #1
  Filled 2022-08-30: qty 25, 25d supply, fill #1
  Filled 2022-11-26: qty 50, 30d supply, fill #2

## 2022-02-28 ENCOUNTER — Other Ambulatory Visit: Payer: Self-pay

## 2022-03-19 ENCOUNTER — Other Ambulatory Visit: Payer: Self-pay

## 2022-03-27 ENCOUNTER — Other Ambulatory Visit: Payer: Self-pay

## 2022-04-01 ENCOUNTER — Other Ambulatory Visit: Payer: Self-pay

## 2022-04-15 ENCOUNTER — Other Ambulatory Visit: Payer: Self-pay

## 2022-04-16 ENCOUNTER — Other Ambulatory Visit: Payer: Self-pay

## 2022-04-30 ENCOUNTER — Other Ambulatory Visit: Payer: Self-pay

## 2022-05-01 ENCOUNTER — Other Ambulatory Visit: Payer: Self-pay

## 2022-05-10 ENCOUNTER — Other Ambulatory Visit: Payer: Self-pay

## 2022-05-10 ENCOUNTER — Other Ambulatory Visit: Payer: Self-pay | Admitting: Internal Medicine

## 2022-05-10 DIAGNOSIS — I10 Essential (primary) hypertension: Secondary | ICD-10-CM

## 2022-05-10 MED ORDER — HYDRALAZINE HCL 50 MG PO TABS
ORAL_TABLET | ORAL | 0 refills | Status: DC
  Filled 2022-05-10: qty 90, 30d supply, fill #0

## 2022-05-27 ENCOUNTER — Other Ambulatory Visit: Payer: Self-pay

## 2022-05-29 ENCOUNTER — Other Ambulatory Visit: Payer: Self-pay

## 2022-05-29 ENCOUNTER — Telehealth: Payer: Self-pay | Admitting: Pharmacist

## 2022-05-29 NOTE — Telephone Encounter (Signed)
Patient attempted to be outreached by Jacolyn Reedy, PharmD Candidate on 05/29/22 to discuss hypertension. Left voicemail for her to return our call at her convenience     Catie TClearance Coots, PharmD, [facility] [phone]

## 2022-06-13 ENCOUNTER — Other Ambulatory Visit: Payer: Self-pay | Admitting: Internal Medicine

## 2022-06-13 ENCOUNTER — Other Ambulatory Visit: Payer: Self-pay

## 2022-06-13 DIAGNOSIS — E1169 Type 2 diabetes mellitus with other specified complication: Secondary | ICD-10-CM

## 2022-06-13 MED ORDER — METFORMIN HCL ER 500 MG PO TB24
500.0000 mg | ORAL_TABLET | Freq: Every day | ORAL | 0 refills | Status: DC
  Filled 2022-06-13: qty 90, 90d supply, fill #0

## 2022-06-13 NOTE — Telephone Encounter (Signed)
Requested Prescriptions  Pending Prescriptions Disp Refills  . metFORMIN (GLUCOPHAGE-XR) 500 MG 24 hr tablet 90 tablet 0    Sig: Take 1 tablet (500 mg total) by mouth daily with breakfast.     Endocrinology:  Diabetes - Biguanides Failed - 06/13/2022  8:32 AM      Failed - Cr in normal range and within 360 days    Creat  Date Value Ref Range Status  12/26/2014 0.77 0.50 - 1.10 mg/dL Final   Creatinine, Ser  Date Value Ref Range Status  04/26/2021 0.72 0.57 - 1.00 mg/dL Final   Creatinine, Urine  Date Value Ref Range Status  11/10/2014 28.9 mg/dL Final    Comment:    No reference range established.         Failed - eGFR in normal range and within 360 days    GFR calc Af Amer  Date Value Ref Range Status  10/23/2020 101 >59 mL/min/1.73 Final    Comment:    **In accordance with recommendations from the NKF-ASN Task force,**   [facility] is in the process of updating its eGFR calculation to the   2021 CKD-EPI creatinine equation that estimates kidney function   without a race variable.    GFR calc non Af Amer  Date Value Ref Range Status  10/23/2020 88 >59 mL/min/1.73 Final   eGFR  Date Value Ref Range Status  04/26/2021 95 >59 mL/min/1.73 Final         Failed - B12 Level in normal range and within 720 days    Vitamin B-12  Date Value Ref Range Status  12/29/2018 482 232 - 1,245 pg/mL Final         Failed - CBC within normal limits and completed in the last 12 months    WBC  Date Value Ref Range Status  10/23/2020 9.7 3.4 - 10.8 x10E3/uL Final  03/02/2018 9.1 4.0 - 10.5 K/uL Final   RBC  Date Value Ref Range Status  10/23/2020 4.72 3.77 - 5.28 x10E6/uL Final  03/02/2018 4.89 3.87 - 5.11 MIL/uL Final   Hemoglobin  Date Value Ref Range Status  10/23/2020 12.4 11.1 - 15.9 g/dL Final   Hematocrit  Date Value Ref Range Status  10/23/2020 37.8 34.0 - 46.6 % Final   MCHC  Date Value Ref Range Status  10/23/2020 32.8 31.5 - 35.7 g/dL Final  03/02/2018 32.6 30.0 -  36.0 g/dL Final   [facility]  Date Value Ref Range Status  10/23/2020 26.3 (L) 26.6 - 33.0 pg Final  03/02/2018 25.6 (L) 26.0 - 34.0 pg Final   MCV  Date Value Ref Range Status  10/23/2020 80 79 - 97 fL Final   No results found for: "PLTCOUNTKUC", "LABPLAT", "POCPLA" RDW  Date Value Ref Range Status  10/23/2020 15.7 (H) 11.7 - 15.4 % Final         Passed - HBA1C is between 0 and 7.9 and within 180 days    HbA1c, POC (prediabetic range)  Date Value Ref Range Status  06/22/2020 6.1 5.7 - 6.4 % Final   HbA1c, POC (controlled diabetic range)  Date Value Ref Range Status  12/31/2021 6.2 0.0 - 7.0 % Final         Passed - Valid encounter within last 6 months    Recent Outpatient Visits          5 months ago Type 2 diabetes mellitus with morbid obesity [facility])   [place] [facility] Ladell Pier, MD  9 months ago Type 2 diabetes mellitus with morbid obesity [facility] [place])   [place], MD   1 year ago Vitamin D deficiency   [place], [place], [place]   1 year ago Need for influenza vaccination   [place], RPH-CPP   1 year ago Type 2 diabetes mellitus with morbid obesity [facility])   [facility] Ladell Pier, MD

## 2022-06-19 ENCOUNTER — Other Ambulatory Visit: Payer: Self-pay

## 2022-06-24 ENCOUNTER — Encounter (HOSPITAL_COMMUNITY): Payer: Self-pay | Admitting: Emergency Medicine

## 2022-06-24 ENCOUNTER — Emergency Department (HOSPITAL_COMMUNITY): Payer: 59

## 2022-06-24 ENCOUNTER — Ambulatory Visit: Payer: Self-pay

## 2022-06-24 ENCOUNTER — Emergency Department (HOSPITAL_COMMUNITY)
Admission: EM | Admit: 2022-06-24 | Discharge: 2022-06-24 | Disposition: A | Discharge status: Home / Self Care | Payer: 59 | Attending: Emergency Medicine | Admitting: Emergency Medicine

## 2022-06-24 ENCOUNTER — Other Ambulatory Visit: Payer: Self-pay

## 2022-06-24 DIAGNOSIS — Z7984 Long term (current) use of oral hypoglycemic drugs: Secondary | ICD-10-CM | POA: Insufficient documentation

## 2022-06-24 DIAGNOSIS — Z79899 Other long term (current) drug therapy: Secondary | ICD-10-CM | POA: Insufficient documentation

## 2022-06-24 DIAGNOSIS — I10 Essential (primary) hypertension: Secondary | ICD-10-CM | POA: Diagnosis not present

## 2022-06-24 DIAGNOSIS — M546 Pain in thoracic spine: Secondary | ICD-10-CM | POA: Diagnosis not present

## 2022-06-24 DIAGNOSIS — J45909 Unspecified asthma, uncomplicated: Secondary | ICD-10-CM | POA: Diagnosis not present

## 2022-06-24 DIAGNOSIS — R0602 Shortness of breath: Secondary | ICD-10-CM | POA: Insufficient documentation

## 2022-06-24 DIAGNOSIS — R42 Dizziness and giddiness: Secondary | ICD-10-CM | POA: Diagnosis not present

## 2022-06-24 DIAGNOSIS — E119 Type 2 diabetes mellitus without complications: Secondary | ICD-10-CM | POA: Diagnosis not present

## 2022-06-24 LAB — CBC WITH DIFFERENTIAL/PLATELET
Abs Immature Granulocytes: 0.03 10*3/uL (ref 0.00–0.07)
Basophils Absolute: 0.1 10*3/uL (ref 0.0–0.1)
Basophils Relative: 1 %
Eosinophils Absolute: 0.2 10*3/uL (ref 0.0–0.5)
Eosinophils Relative: 3 %
HCT: 36.7 % (ref 36.0–46.0)
Hemoglobin: 11.8 g/dL — ABNORMAL LOW (ref 12.0–15.0)
Immature Granulocytes: 0 %
Lymphocytes Relative: 33 %
Lymphs Abs: 2.5 10*3/uL (ref 0.7–4.0)
MCH: 25.5 pg — ABNORMAL LOW (ref 26.0–34.0)
MCHC: 32.2 g/dL (ref 30.0–36.0)
MCV: 79.3 fL — ABNORMAL LOW (ref 80.0–100.0)
Monocytes Absolute: 0.7 10*3/uL (ref 0.1–1.0)
Monocytes Relative: 10 %
Neutro Abs: 4 10*3/uL (ref 1.7–7.7)
Neutrophils Relative %: 53 %
Platelets: 363 10*3/uL (ref 150–400)
RBC: 4.63 MIL/uL (ref 3.87–5.11)
RDW: 16.1 % — ABNORMAL HIGH (ref 11.5–15.5)
WBC: 7.5 10*3/uL (ref 4.0–10.5)
nRBC: 0 % (ref 0.0–0.2)

## 2022-06-24 LAB — BRAIN NATRIURETIC PEPTIDE: B Natriuretic Peptide: 19 pg/mL (ref 0.0–100.0)

## 2022-06-24 LAB — TROPONIN I (HIGH SENSITIVITY)
Troponin I (High Sensitivity): 5 ng/L (ref <18)
Troponin I (High Sensitivity): 5 ng/L (ref <18)

## 2022-06-24 LAB — BASIC METABOLIC PANEL
Anion gap: 9 (ref 5–15)
BUN: 23 mg/dL (ref 8–23)
CO2: 27 mmol/L (ref 22–32)
Calcium: 9.3 mg/dL (ref 8.9–10.3)
Chloride: 103 mmol/L (ref 98–111)
Creatinine, Ser: 0.82 mg/dL (ref 0.44–1.00)
GFR, Estimated: >60 mL/min (ref >60)
Glucose, Bld: 112 mg/dL — ABNORMAL HIGH (ref 70–99)
Potassium: 4.2 mmol/L (ref 3.5–5.1)
Sodium: 139 mmol/L (ref 135–145)

## 2022-06-24 LAB — TSH: TSH: 1.422 u[IU]/mL (ref 0.350–4.500)

## 2022-06-24 MED ORDER — IOHEXOL 350 MG/ML SOLN
83.0000 mL | Freq: Once | INTRAVENOUS | Status: AC | PRN
  Administered 2022-06-24: 83 mL via INTRAVENOUS

## 2022-06-24 NOTE — Telephone Encounter (Signed)
  Chief Complaint: Dizziness, SOB, Cough, labile BP, Cough Symptoms: ibid Frequency: since Friday Pertinent Negatives: Patient denies Fever Disposition: [x] ED /[] Urgent Care (no appt availability in office) / [] Appointment(In office/virtual)/ []  [facility]/ [] Home Care/ [] Refused Recommended Disposition /[] [place] [facility]/ []  Follow-up with PCP Additional Notes: Pt states that starting Friday she has been feeling off. She has had SOB, and dizziness (where she feels like she is going to pass out - loss of Periferal vision), labile BP's some were very low now a bit elevated, and cough. PT will have her cousin to take her to ED. Reason for Disposition  [1] MILD difficulty breathing (e.g., minimal/no SOB at rest, SOB with walking, pulse <100) AND [2] NEW-onset or WORSE than normal  [1] MODERATE dizziness (e.g., interferes with normal activities) AND [2] has NOT been evaluated by doctor (or NP/PA) for this  (Exception: Dizziness caused by heat exposure, sudden standing, or poor fluid intake.)  Protocols used: Breathing Difficulty-A-AH, Dizziness - Lightheadedness-A-AH

## 2022-06-24 NOTE — ED Triage Notes (Signed)
Pt states on Friday she started having blurry vision and "dizzy spells".  Pt states this continued over the weekend, then started feeling more SOB. Pt complains of a pain in the middle of her mid upper back as well that started Saturday night. Pt also complains of headaches.

## 2022-06-24 NOTE — ED Provider Notes (Signed)
[facility]ERGENCY DEPARTMENT Provider Note   CSN: [id number] Arrival date & time: 06/24/22  8466     History {Add pertinent medical, surgical, social history, OB history to HPI:1} No chief complaint on file.   Linda Cooper is a 61 y.o. female.  HPI     Home Medications Prior to Admission medications   Medication Sig Start Date End Date Taking? Authorizing Provider  albuterol (VENTOLIN HFA) 108 (90 Base) MCG/ACT inhaler Inhale 2 puffs into the lungs every 4 (four) hours as needed for wheezing. Patient not taking: Reported on 08/31/2021 04/11/21   Argentina Donovan, PA-C  Blood Glucose Monitoring Suppl (CONTOUR NEXT MONITOR) w/Device KIT 1 Units by Does not apply route daily. 09/28/18   Ladell Pier, MD  glucose blood (CONTOUR NEXT TEST) test strip USE AS INSTRUCTED ONCE DAILY 02/27/22 03/03/23  Ladell Pier, MD  hydrALAZINE (APRESOLINE) 50 MG tablet TAKE 1.5 TABLETS (75 MG TOTAL) BY MOUTH 2 (TWO) TIMES DAILY. 05/10/22   Ladell Pier, MD  losartan-hydrochlorothiazide (HYZAAR) 100-25 MG tablet TAKE 1 TABLET BY MOUTH DAILY. 08/31/21 08/31/22  Ladell Pier, MD  metFORMIN (GLUCOPHAGE-XR) 500 MG 24 hr tablet Take 1 tablet (500 mg total) by mouth daily with breakfast. 06/13/22   Ladell Pier, MD  MICROLET LANCETS MISC Use as directed once daily 09/28/18   Ladell Pier, MD  Multiple Vitamins-Minerals (MULTIVITAMIN WITH MINERALS) tablet Take 1 tablet by mouth daily.    [provider]  naproxen (NAPROSYN) 500 MG tablet Take 1 tablet (500 mg total) by mouth 2 (two) times daily. 01/18/18   Tereasa Coop, PA-C  NON FORMULARY SEGA PRO BLADDER HEALTH 100 MG DAILY    [provider]  solifenacin (VESICARE) 5 MG tablet Take 1 tablet (5 mg total) by mouth daily. 01/29/22   Ladell Pier, MD  Vitamin D, Ergocalciferol, (DRISDOL) 1.25 MG (50000 UT) CAPS capsule Take 1 capsule (50,000 Units total) by mouth every 7 (seven) days.  01/12/19   Laqueta Linden, MD      Allergies    Ace inhibitors and Erythromycin base    Review of Systems   Review of Systems  Physical Exam Updated Vital Signs BP (!) 164/98 (BP Location: Right Arm)   Pulse 70   Temp 98.4 F (36.9 C) (Oral)   Resp 16   SpO2 97%  Physical Exam  ED Results / Procedures / Treatments   Labs (all labs ordered are listed, but only abnormal results are displayed) Labs Reviewed  BASIC METABOLIC PANEL - Abnormal; Notable for the following components:      Result Value   Glucose, Bld 112 (*)    All other components within normal limits  CBC WITH DIFFERENTIAL/PLATELET - Abnormal; Notable for the following components:   Hemoglobin 11.8 (*)    MCV 79.3 (*)    MCH 25.5 (*)    RDW 16.1 (*)    All other components within normal limits  BRAIN NATRIURETIC PEPTIDE  TSH  TROPONIN I (HIGH SENSITIVITY)  TROPONIN I (HIGH SENSITIVITY)    EKG None  Radiology DG Chest 2 View  Result Date: 06/24/2022 CLINICAL DATA:  A 61 year old female presents for evaluation of shortness of breath and chest discomfort. EXAM: CHEST - 2 VIEW COMPARISON:  March 02, 2018. FINDINGS: Cardiomediastinal contours and hilar structures are stable with signs of moderate cardiac enlargement. No signs of lobar consolidation. No pneumothorax. No pleural effusion. On lateral projection added density in the  subcarinal region and middle mediastinum is of uncertain significance. On limited assessment no acute skeletal process. IMPRESSION: 1. Cardiomegaly without signs of acute cardiopulmonary disease. 2. On lateral projection added density in the subcarinal region and middle mediastinum is of uncertain significance. Findings could reflect adenopathy. Follow-up with chest CT is suggested for further evaluation. Electronically Signed   By: Zetta Bills M.D.   On: 06/24/2022 10:55   CT Head Wo Contrast  Result Date: 06/24/2022 CLINICAL DATA:  Headache, new or worsening (Age >= 50y) EXAM: CT  HEAD WITHOUT CONTRAST TECHNIQUE: Contiguous axial images were obtained from the base of the skull through the vertex without intravenous contrast. RADIATION DOSE REDUCTION: This exam was performed according to the departmental dose-optimization program which includes automated exposure control, adjustment of the mA and/or kV according to patient size and/or use of iterative reconstruction technique. COMPARISON:  2009 FINDINGS: Brain: There is no acute intracranial hemorrhage, mass effect, or edema. Gray-white differentiation is preserved. There is no extra-axial fluid collection. Ventricles and sulci are within normal limits in size and configuration. Minimal patchy low-density in the supratentorial white matter is nonspecific but may reflect minor chronic microvascular ischemic changes. Vascular: There is atherosclerotic calcification at the skull base. Skull: Calvarium is unremarkable. Sinuses/Orbits: No acute finding. Other: None. IMPRESSION: No acute intracranial abnormality. Electronically Signed   By: Macy Mis M.D.   On: 06/24/2022 10:44    Procedures Procedures  {Document cardiac monitor, telemetry assessment procedure when appropriate:1}  Medications Ordered in ED Medications - No data to display  ED Course/ Medical Decision Making/ A&P                           Medical Decision Making  ***  {Document critical care time when appropriate:1} {Document review of labs and clinical decision tools ie heart score, Chads2Vasc2 etc:1}  {Document your independent review of radiology images, and any outside records:1} {Document your discussion with family members, caretakers, and with consultants:1} {Document social determinants of health affecting pt's care:1} {Document your decision making why or why not admission, treatments were needed:1} Final Clinical Impression(s) / ED Diagnoses Final diagnoses:  None    Rx / DC Orders ED Discharge Orders     None

## 2022-06-24 NOTE — ED Provider Triage Note (Signed)
Emergency Medicine Provider Triage Evaluation Note  Linda Cooper , a 61 y.o. female  was evaluated in triage.  Pt complains of multiple complaints- onset of HA (BL), Neck pain (BL) and mild dizziness without n/v 3 days ago. + blurry vision. No hx of same. Now feeling chest discomfort and sob..  Review of Systems  Positive: ha Negative: fever  Physical Exam  BP (!) 173/114 (BP Location: Right Arm)   Pulse 84   Temp 98.3 F (36.8 C) (Oral)   Resp 18   SpO2 93%  Gen:   Awake, no distress   Resp:  Normal effort  MSK:   Moves extremities without difficulty  Other:  No vertigo  Medical Decision Making  Medically screening exam initiated at 10:06 AM.  Appropriate orders placed.  Linda Cooper was informed that the remainder of the evaluation will be completed by another provider, this initial triage assessment does not replace that evaluation, and the importance of remaining in the ED until their evaluation is complete.  Work up [employer], [place], PA-C 06/24/22 1008

## 2022-06-24 NOTE — Discharge Instructions (Addendum)
You were seen in the emergency department for evaluation of shortness of breath.  Extensive laboratory work was performed to test for signs of infection, fluid overload, and heart attack all of which were reassuring.  No signs of electrolyte abnormalities.  The CT of your head be performed was very reassuring and showed no acute findings.  The CT of your chest we performed did not show any signs of blood clot in your lungs and showed no signs of underlying pneumonia or excessive fluid.  In the circumstance of these findings we feel that your symptoms are most likely musculoskeletal in origin.  We recommend continuing symptomatic management at home with ibuprofen and Tylenol as well as appropriate rest.  Please follow-up with your primary care provider in the coming days for symptom recheck.

## 2022-06-27 ENCOUNTER — Other Ambulatory Visit: Payer: Self-pay | Admitting: Internal Medicine

## 2022-06-27 ENCOUNTER — Other Ambulatory Visit: Payer: Self-pay

## 2022-06-27 DIAGNOSIS — N3941 Urge incontinence: Secondary | ICD-10-CM

## 2022-06-27 DIAGNOSIS — I10 Essential (primary) hypertension: Secondary | ICD-10-CM

## 2022-06-27 MED ORDER — HYDRALAZINE HCL 50 MG PO TABS
ORAL_TABLET | ORAL | 0 refills | Status: DC
  Filled 2022-06-27: qty 90, 30d supply, fill #0

## 2022-06-27 MED ORDER — SOLIFENACIN SUCCINATE 5 MG PO TABS
5.0000 mg | ORAL_TABLET | Freq: Every day | ORAL | 0 refills | Status: DC
  Filled 2022-06-27: qty 30, 30d supply, fill #0

## 2022-06-28 ENCOUNTER — Ambulatory Visit: Payer: 59 | Admitting: Surgical

## 2022-06-28 ENCOUNTER — Other Ambulatory Visit: Payer: Self-pay

## 2022-06-28 DIAGNOSIS — M170 Bilateral primary osteoarthritis of knee: Secondary | ICD-10-CM

## 2022-06-29 ENCOUNTER — Encounter: Payer: Self-pay | Admitting: Surgical

## 2022-06-29 MED ORDER — BUPIVACAINE HCL 0.25 % IJ SOLN
4.0000 mL | INTRAMUSCULAR | Status: AC | PRN
  Administered 2022-06-28: 4 mL via INTRA_ARTICULAR

## 2022-06-29 MED ORDER — LIDOCAINE HCL 1 % IJ SOLN
5.0000 mL | INTRAMUSCULAR | Status: AC | PRN
  Administered 2022-06-28: 5 mL

## 2022-06-29 MED ORDER — METHYLPREDNISOLONE ACETATE 40 MG/ML IJ SUSP
40.0000 mg | INTRAMUSCULAR | Status: AC | PRN
  Administered 2022-06-28: 40 mg via INTRA_ARTICULAR

## 2022-06-29 NOTE — Progress Notes (Signed)
Office Visit Note   Patient: Linda Cooper           Date of Birth: March 09, 1961           MRN: [id number] Visit Date: 06/28/2022 Requested by: Marcine Matar, MD [address] [place] [address] [place],  [place] [address] PCP: Marcine Matar, MD  Subjective: Chief Complaint  Patient presents with   Right Knee - Pain   Left Knee - Pain    HPI: Linda Cooper is a 61 y.o. female who presents to the office complaining of bilateral knee pain.  Patient has history of severe bilateral knee osteoarthritis.  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  She had left knee injection in early February that provided good relief for her and lasted up until 1 to 2 months ago.  She would like to repeat this injection in both knees.  No recent injury.  She is working on weight loss to help with her knee pain and for consideration of knee replacement..                ROS: All systems reviewed are negative as they relate to the chief complaint within the history of present illness.  Patient denies fevers or chills.  Assessment & Plan: Visit Diagnoses:  1. Primary osteoarthritis of both knees     Plan: Patient is a 61 year old female who presents for evaluation of bilateral knee osteoarthritis.  She would like to have bilateral knee injections.  Previous left knee injection with cortisone provided good relief for her for several months.  She is still working on weight loss for possibility of total knee arthroplasty in the future.  Tolerated bilateral knee injections well without difficulty.  Follow-up with the office as needed.  Follow-Up Instructions: No follow-ups on file.   Orders:  No orders of the defined types were placed in this encounter.  No orders of the defined types were placed in this encounter.     Procedures: Large Joint Inj: bilateral knee on 06/28/2022 1:44 PM Indications: diagnostic evaluation, joint swelling and pain Details: 18 G 1.5 in needle,  superolateral approach  Arthrogram: No  Medications (Right): 5 mL lidocaine 1 %; 4 mL bupivacaine 0.25 %; 40 mg methylPREDNISolone acetate 40 MG/ML Medications (Left): 5 mL lidocaine 1 %; 4 mL bupivacaine 0.25 %; 40 mg methylPREDNISolone acetate 40 MG/ML Outcome: tolerated well, no immediate complications Procedure, treatment alternatives, risks and benefits explained, specific risks discussed. Consent was given by the patient. Immediately prior to procedure a time out was called to verify the correct patient, procedure, equipment, support staff and site/side marked as required. Patient was prepped and draped in the usual sterile fashion.       Clinical Data: No additional findings.  Objective: Vital Signs: There were no vitals taken for this visit.  Physical Exam:  Constitutional: Patient appears well-developed HEENT:  Head: Normocephalic Eyes:EOM are normal Neck: Normal range of motion Cardiovascular: Normal rate Pulmonary/chest: Effort normal Neurologic: Patient is alert Skin: Skin is warm Psychiatric: Patient has normal mood and affect  Ortho Exam: Ortho exam demonstrates bilateral knees with no effusion.  Able to perform straight leg raise bilaterally.  No pain with hip range of motion.  Bilateral knees with 5 degree extension and 95 degrees of knee flexion on the right with 80 degrees of knee flexion on the left.  Specialty Comments:  No specialty comments available.  Imaging: No results found.  PMFS History: Patient Active Problem List   Diagnosis Date Noted   Urge incontinence 08/31/2021   Pain, dental 08/16/2021   Type 2 diabetes mellitus with hyperglycemia, without long-term current use of insulin (HCC) 06/22/2020   Trigger middle finger of left hand 11/08/2019   Carpal tunnel syndrome of left wrist 07/09/2019   Morbid obesity with body mass index (BMI) of 50.0 to 59.9 in adult (HCC) 10/16/2018   Morbid obesity (HCC) 11/11/2014   PCOS (polycystic ovarian  syndrome) 11/10/2014   Degenerative arthritis of knee, bilateral 11/10/2014   Diabetes mellitus without complication (HCC)    Elevated blood pressure reading in office with diagnosis of hypertension    Past Medical History:  Diagnosis Date   'light-for-dates' infant with signs of fetal malnutrition    Anemia    Arthritis    Asthma    Diabetes mellitus without complication (HCC) Dx 2007   GERD (gastroesophageal reflux disease)    Hypertension Dx 1990   Knee pain, bilateral    Obesity    Rheumatic fever childhood    has intermittent murmur and hand arthritis     Family History  Problem Relation Age of Onset   Hypertension Mother    Diabetes Mother    Stroke Mother    Heart disease Mother    Obesity Mother    Hypertension Father    Diabetes Father    Stroke Father    Obesity Father    Heart disease Father    Hyperlipidemia Father    Diabetes Brother    Hypertension Brother    Gout Brother    Heart disease Sister    Breast cancer Cousin    Cancer Neg Hx     Past Surgical History:  Procedure Laterality Date   ABDOMINAL HYSTERECTOMY  2006    HERNIA REPAIR  2006    Umbilical    Social History   Occupational History   Occupation: Technical brewer    Comment: [employer], in home care,   Tobacco Use   Smoking status: Never   Smokeless tobacco: Never  Vaping Use   Vaping Use: Never used  Substance and Sexual Activity   Alcohol use: No   Drug use: No   Sexual activity: Not Currently

## 2022-07-16 ENCOUNTER — Other Ambulatory Visit: Payer: Self-pay

## 2022-07-17 ENCOUNTER — Other Ambulatory Visit: Payer: Self-pay

## 2022-07-17 NOTE — Progress Notes (Unsigned)
Patient appearing on report for [facility] - Hypertension Control report due to last documented ambulatory blood pressure of 165/94 on 12/31/2021. Next appointment with [facility] is 07/24/2022.   Outreached patient to discuss hypertension control and medication management. Left voicemail for patient to return my call at their convenience.   Valeda Malm, Pharm.D. PGY-2 Ambulatory Care Pharmacy Resident 07/17/2022 3:07 PM

## 2022-07-19 ENCOUNTER — Other Ambulatory Visit: Payer: Self-pay

## 2022-07-24 ENCOUNTER — Encounter: Payer: Self-pay | Admitting: Physician Assistant

## 2022-07-24 ENCOUNTER — Ambulatory Visit: Payer: 59 | Attending: Physician Assistant | Admitting: Physician Assistant

## 2022-07-24 ENCOUNTER — Other Ambulatory Visit: Payer: Self-pay

## 2022-07-24 VITALS — BP 160/103 | HR 92 | Temp 98.0°F | Resp 17 | Ht 62.0 in | Wt 307.0 lb

## 2022-07-24 DIAGNOSIS — N3941 Urge incontinence: Secondary | ICD-10-CM | POA: Diagnosis not present

## 2022-07-24 DIAGNOSIS — R0609 Other forms of dyspnea: Secondary | ICD-10-CM

## 2022-07-24 DIAGNOSIS — E1169 Type 2 diabetes mellitus with other specified complication: Secondary | ICD-10-CM

## 2022-07-24 DIAGNOSIS — I10 Essential (primary) hypertension: Secondary | ICD-10-CM

## 2022-07-24 DIAGNOSIS — J40 Bronchitis, not specified as acute or chronic: Secondary | ICD-10-CM

## 2022-07-24 DIAGNOSIS — Z09 Encounter for follow-up examination after completed treatment for conditions other than malignant neoplasm: Secondary | ICD-10-CM

## 2022-07-24 MED ORDER — SOLIFENACIN SUCCINATE 10 MG PO TABS
10.0000 mg | ORAL_TABLET | Freq: Every day | ORAL | 1 refills | Status: DC
  Filled 2022-07-24: qty 30, 30d supply, fill #0
  Filled 2022-08-29: qty 30, 30d supply, fill #1
  Filled 2022-09-23: qty 30, 30d supply, fill #2
  Filled 2022-10-28: qty 30, 30d supply, fill #3
  Filled 2022-11-25: qty 30, 30d supply, fill #4
  Filled 2022-12-27: qty 30, 30d supply, fill #5

## 2022-07-24 MED ORDER — LOSARTAN POTASSIUM-HCTZ 100-25 MG PO TABS
1.0000 | ORAL_TABLET | Freq: Every day | ORAL | 3 refills | Status: DC
Start: 2022-07-24 — End: 2023-08-25
  Filled 2022-07-24: qty 90, 90d supply, fill #0
  Filled 2022-08-15: qty 30, 30d supply, fill #0
  Filled 2022-09-17: qty 30, 30d supply, fill #1
  Filled 2022-10-17: qty 30, 30d supply, fill #2
  Filled 2022-11-13 – 2022-11-20 (×2): qty 30, 30d supply, fill #3
  Filled 2022-12-17: qty 30, 30d supply, fill #4
  Filled 2023-01-15: qty 30, 30d supply, fill #5
  Filled 2023-02-17: qty 30, 30d supply, fill #6
  Filled 2023-03-18: qty 30, 30d supply, fill #7
  Filled 2023-04-17: qty 90, 90d supply, fill #8
  Filled 2023-07-22: qty 30, 30d supply, fill #9

## 2022-07-24 MED ORDER — HYDRALAZINE HCL 50 MG PO TABS
75.0000 mg | ORAL_TABLET | Freq: Three times a day (TID) | ORAL | 3 refills | Status: DC
  Filled 2022-07-24 – 2022-08-01 (×2): qty 135, 30d supply, fill #0

## 2022-07-24 MED ORDER — ALBUTEROL SULFATE HFA 108 (90 BASE) MCG/ACT IN AERS
2.0000 | INHALATION_SPRAY | RESPIRATORY_TRACT | 1 refills | Status: AC | PRN
  Filled 2022-07-24: qty 8.5, 17d supply, fill #0
  Filled 2022-08-01: qty 6.7, 16d supply, fill #0

## 2022-07-24 MED ORDER — METFORMIN HCL ER 500 MG PO TB24
500.0000 mg | ORAL_TABLET | Freq: Every day | ORAL | 1 refills | Status: DC
  Filled 2022-07-24: qty 90, 90d supply, fill #0
  Filled 2022-11-13 – 2022-11-20 (×2): qty 30, 30d supply, fill #0
  Filled 2022-12-17: qty 30, 30d supply, fill #1
  Filled 2023-02-07: qty 30, 30d supply, fill #2
  Filled 2023-03-11: qty 30, 30d supply, fill #3
  Filled 2023-04-14: qty 30, 30d supply, fill #4
  Filled 2023-05-15 (×2): qty 30, 30d supply, fill #5

## 2022-07-24 NOTE — Patient Instructions (Signed)
Check blood pressure at least once daily and record-bring readings to next visit

## 2022-07-24 NOTE — Progress Notes (Signed)
Patient ID: Linda Cooper, female   DOB: 11/07/1961, 62 y.o.   MRN: [id number]     Linda Cooper, is a 61 y.o. female  IZT:[id number]  [id number]:[id number]  DOB - 1961/01/28  Chief Complaint  Patient presents with   Follow-up    SOB, Thoracic back pain, intermittent pain with burping, antacid helped, unk etiology       Subjective:   Linda Cooper is a 61 y.o. female here today for a follow up after ED visit 06/24/2022 for Pain in back bt shoulder blades  and SOB.  The pain is better/improving.  Still having DOE and requesting RF albuterol.  She is wanting to talk to Dr Wynetta Emery about disability.  No orthopnea.  Requesting an albuterol inhaler bc it has always helped in the past.  Denies CP.    March-one brother died massive MI. 04/23/2023 -another brother died.  She has been grieving and unable to focus on her health.  She has not been on meds for depression in the past and not interested in taking anything  Blood pressure 160/91 this morning.  BP 140's-150s/90s at home  From ED note HPI Patient is a 61 year old female with a history of HTN, GERD, DM, asthma, arthritis, rheumatic fever who presents emergency department for evaluation of shortness of breath.  According to the patient this past Friday she had onset of intermittent lightheadedness as well as shortness of breath.  Her shortness of breath was made worse with laying flat.  She has not noted any cough or chest pain.  Patient does note that 2 days ago she had onset of pain in her mid thoracic back which radiates around under her bilateral breasts.  This pain is made worse with palpation and movement.  She denies a history of similar symptoms.  She is currently compliant with all of her home medications.  She denies any urinary symptoms, nausea, vomiting, diarrhea.  She denies any direct trauma to her back or history of IVDU.  Patient is a 61 year old female who presents emergency department as above.  On initial presentation patient's  vital signs are stable and she is afebrile.  Physical exam does reveal some tenderness palpation in the patient's mid thoracic back.  There is tenderness radiates around her bilateral breast.  No direct trauma to the area or overlying bruising.  Baseline laboratory work and imaging studies have been ordered from triage.   BMP grossly unremarkable.  BNP 19.  Delta troponin 5 and 5.  CBC without evidence of leukocytosis but a mild microcytic anemia at 11.8.  TSH within normal limits.  CT head showed no acute intracranial abnormalities.  Chest x-ray showed cardiomegaly without signs of acute cardiopulmonary disease.  In the setting of the patient's ongoing shortness of breath possibility for PE and thus a CT PE was ordered which did not show any acute intrathoracic pathology, including no pulmonary artery embolus.   In the absence of any acute emergent findings patient was felt appropriate for further management in the outpatient setting.  She was instructed to follow-up with her primary care provider in the coming days for symptom recheck.  Plan and findings discussed with patient at bedside and she verbalized understanding was agreeable.  Patient discharged from the ED in stable condition.   No problems updated.  ALLERGIES: Allergies  Allergen Reactions   Ace Inhibitors Cough   Erythromycin Base     Cramping in abdomen is reaction/no rash    PAST MEDICAL HISTORY: Past Medical History:  Diagnosis  Date   'light-for-dates' infant with signs of fetal malnutrition    Anemia    Arthritis    Asthma    Diabetes mellitus without complication ([facility]) Dx 7124   GERD (gastroesophageal reflux disease)    Hypertension Dx 1990   Knee pain, bilateral    Obesity    Rheumatic fever childhood    has intermittent murmur and hand arthritis     MEDICATIONS AT HOME: Prior to Admission medications   Medication Sig Start Date End Date Taking? Authorizing Provider  Blood Glucose Monitoring Suppl (CONTOUR NEXT  MONITOR) w/Device KIT 1 Units by Does not apply route daily. 09/28/18  Yes Ladell Pier, MD  glucose blood (CONTOUR NEXT TEST) test strip USE AS INSTRUCTED ONCE DAILY 02/27/22 03/03/23 Yes Ladell Pier, MD  MICROLET LANCETS MISC Use as directed once daily 09/28/18  Yes Ladell Pier, MD  Multiple Vitamins-Minerals (MULTIVITAMIN WITH MINERALS) tablet Take 1 tablet by mouth daily.   Yes [provider]  naproxen (NAPROSYN) 500 MG tablet Take 1 tablet (500 mg total) by mouth 2 (two) times daily. 01/18/18  Yes Tereasa Coop, PA-C  NON FORMULARY SEGA PRO BLADDER HEALTH 100 MG DAILY   Yes [provider]  solifenacin (VESICARE) 10 MG tablet Take 1 tablet (10 mg total) by mouth daily. 07/24/22  Yes Phylicia Mcgaugh, Dionne Bucy, PA-C  Vitamin D, Ergocalciferol, (DRISDOL) 1.25 MG (50000 UT) CAPS capsule Take 1 capsule (50,000 Units total) by mouth every 7 (seven) days. 01/12/19  Yes Laqueta Linden, MD  albuterol (VENTOLIN HFA) 108 (90 Base) MCG/ACT inhaler Inhale 2 puffs into the lungs every 4 (four) hours as needed for wheezing. 07/24/22   Argentina Donovan, PA-C  hydrALAZINE (APRESOLINE) 50 MG tablet Take 1.5 tablets (75 mg total) by mouth 3 (three) times daily. 07/24/22   Argentina Donovan, PA-C  losartan-hydrochlorothiazide (HYZAAR) 100-25 MG tablet TAKE 1 TABLET BY MOUTH DAILY. 07/24/22 07/24/23  Argentina Donovan, PA-C  metFORMIN (GLUCOPHAGE-XR) 500 MG 24 hr tablet Take 1 tablet (500 mg total) by mouth daily with breakfast. 07/24/22   Sinclair Alligood, Dionne Bucy, PA-C    ROS: Neg HEENT Neg cardiac Neg GI Neg GU Neg MS Neg psych; some grief Neg neuro  Objective:   Vitals:   07/24/22 1109 07/24/22 1224  BP: (!) 161/103 (!) 160/103  Pulse: 92   Resp: 17   Temp: 98 F (36.7 C)   SpO2: 98%   Weight: (!) 307 lb (139.3 kg)   Height: 5' 2"  (1.575 m)    Exam General appearance : Awake, alert, not in any distress. Speech Clear. Not toxic looking;  morbidly obese.   Depressed/blunted affect.   HEENT: Atraumatic and Normocephalic, pupils equally reactive to light and accomodation Neck: Supple, no JVD. No cervical lymphadenopathy.  Chest: fair air entry bilaterally, CTAB.  No rales/rhonchi.  There are some mild wheezes throughout CVS: S1 S2 regular, no murmurs.  Extremities: B/L Lower Ext shows mild edema, both legs are warm to touch Neurology: Awake alert, and oriented X 3, CN II-XII intact, Non focal Skin: No Rash  Data Review Lab Results  Component Value Date   HGBA1C 6.2 12/31/2021   HGBA1C 6.0 08/31/2021   HGBA1C 6.3 (H) 04/26/2021    Assessment & Plan   1. Bronchitis-previously albuterol helpful for SOB - albuterol (VENTOLIN HFA) 108 (90 Base) MCG/ACT inhaler; Inhale 2 puffs into the lungs every 4 (four) hours as needed for wheezing.  Dispense: 18 g; Refill: 1  2. Essential hypertension Not controlled - hydrALAZINE (APRESOLINE) 50 MG tablet; Take 1.5 tablets (75 mg total) by mouth 3 (three) times daily.  Dispense: 135 tablet; Refill: 3 Continue losartan/hct 110/25  3. Type 2 diabetes mellitus with morbid obesity (HCC) - Hemoglobin A1c - metFORMIN (GLUCOPHAGE-XR) 500 MG 24 hr tablet; Take 1 tablet (500 mg total) by mouth daily with breakfast.  Dispense: 90 tablet; Refill: 1  4. Urge incontinence 67m only somewhat helpful-increase dose - solifenacin (VESICARE) 10 MG tablet; Take 1 tablet (10 mg total) by mouth daily.  Dispense: 90 tablet; Refill: 1  5. DOE (dyspnea on exertion) - albuterol (VENTOLIN HFA) 108 (90 Base) MCG/ACT inhaler; Inhale 2 puffs into the lungs every 4 (four) hours as needed for wheezing.  Dispense: 18 g; Refill: 1  6. Hospital discharge follow-up improving    Return in about 6 weeks (around 09/04/2022) for see PCP for f/up in 6 weeks for BP recheck and other concerns.  The patient was given clear instructions to go to ER or return to medical center if symptoms don't improve, worsen or new problems develop. The  patient verbalized understanding. The patient was told to call to get lab results if they haven't heard anything in the next week.      AFreeman Caldron PA-C [facility]  07/24/2022, 12:25 PM

## 2022-07-25 ENCOUNTER — Other Ambulatory Visit: Payer: Self-pay

## 2022-07-25 LAB — HEMOGLOBIN A1C
Est. average glucose Bld gHb Est-mCnc: 140 mg/dL
Hgb A1c MFr Bld: 6.5 % — ABNORMAL HIGH (ref 4.8–5.6)

## 2022-07-26 ENCOUNTER — Other Ambulatory Visit: Payer: Self-pay

## 2022-07-31 ENCOUNTER — Other Ambulatory Visit: Payer: Self-pay

## 2022-08-01 ENCOUNTER — Other Ambulatory Visit: Payer: Self-pay

## 2022-08-02 ENCOUNTER — Other Ambulatory Visit: Payer: Self-pay

## 2022-08-15 ENCOUNTER — Other Ambulatory Visit: Payer: Self-pay

## 2022-08-16 ENCOUNTER — Other Ambulatory Visit: Payer: Self-pay

## 2022-08-19 ENCOUNTER — Other Ambulatory Visit: Payer: Self-pay

## 2022-08-20 ENCOUNTER — Other Ambulatory Visit: Payer: Self-pay

## 2022-08-26 ENCOUNTER — Other Ambulatory Visit: Payer: Self-pay

## 2022-08-29 ENCOUNTER — Other Ambulatory Visit: Payer: Self-pay

## 2022-08-30 ENCOUNTER — Other Ambulatory Visit: Payer: Self-pay

## 2022-09-06 ENCOUNTER — Encounter: Payer: Self-pay | Admitting: Internal Medicine

## 2022-09-06 ENCOUNTER — Ambulatory Visit (HOSPITAL_BASED_OUTPATIENT_CLINIC_OR_DEPARTMENT_OTHER): Payer: Self-pay | Admitting: Pharmacist

## 2022-09-06 ENCOUNTER — Encounter: Payer: Self-pay | Admitting: Pharmacist

## 2022-09-06 ENCOUNTER — Ambulatory Visit: Payer: Self-pay | Attending: Internal Medicine | Admitting: Internal Medicine

## 2022-09-06 ENCOUNTER — Other Ambulatory Visit: Payer: Self-pay

## 2022-09-06 DIAGNOSIS — M19041 Primary osteoarthritis, right hand: Secondary | ICD-10-CM

## 2022-09-06 DIAGNOSIS — I517 Cardiomegaly: Secondary | ICD-10-CM

## 2022-09-06 DIAGNOSIS — E1169 Type 2 diabetes mellitus with other specified complication: Secondary | ICD-10-CM | POA: Diagnosis not present

## 2022-09-06 DIAGNOSIS — M19042 Primary osteoarthritis, left hand: Secondary | ICD-10-CM

## 2022-09-06 DIAGNOSIS — I152 Hypertension secondary to endocrine disorders: Secondary | ICD-10-CM

## 2022-09-06 DIAGNOSIS — R600 Localized edema: Secondary | ICD-10-CM

## 2022-09-06 DIAGNOSIS — I700 Atherosclerosis of aorta: Secondary | ICD-10-CM | POA: Diagnosis not present

## 2022-09-06 DIAGNOSIS — R0609 Other forms of dyspnea: Secondary | ICD-10-CM

## 2022-09-06 DIAGNOSIS — Z23 Encounter for immunization: Secondary | ICD-10-CM

## 2022-09-06 DIAGNOSIS — M170 Bilateral primary osteoarthritis of knee: Secondary | ICD-10-CM

## 2022-09-06 DIAGNOSIS — E1159 Type 2 diabetes mellitus with other circulatory complications: Secondary | ICD-10-CM

## 2022-09-06 DIAGNOSIS — Z79899 Other long term (current) drug therapy: Secondary | ICD-10-CM

## 2022-09-06 LAB — POCT GLYCOSYLATED HEMOGLOBIN (HGB A1C): HbA1c, POC (controlled diabetic range): 6.4 % (ref 0.0–7.0)

## 2022-09-06 MED ORDER — ZOSTER VAC RECOMB ADJUVANTED 50 MCG/0.5ML IM SUSR: 0.5000 mL | Freq: Once | INTRAMUSCULAR | 0 refills | Status: AC

## 2022-09-06 MED ORDER — TRULICITY 0.75 MG/0.5ML ~~LOC~~ SOAJ
0.7500 mg | SUBCUTANEOUS | 2 refills | Status: DC
  Filled 2022-09-06: qty 2, 28d supply, fill #0
  Filled 2022-10-04: qty 2, 28d supply, fill #1
  Filled 2022-11-02 – 2022-11-04 (×2): qty 2, 28d supply, fill #2

## 2022-09-06 MED ORDER — HYDRALAZINE HCL 100 MG PO TABS
100.0000 mg | ORAL_TABLET | Freq: Three times a day (TID) | ORAL | 5 refills | Status: DC
  Filled 2022-09-06: qty 90, 30d supply, fill #0
  Filled 2022-10-04 – 2022-11-25 (×2): qty 90, 30d supply, fill #1
  Filled 2023-01-21: qty 90, 30d supply, fill #2

## 2022-09-06 NOTE — Progress Notes (Signed)
Patient was educated on the use of the Trulicity pen. Reviewed necessary supplies and operation of the pen. Also reviewed goal blood glucose levels. Patient was able to demonstrate use. All questions and concerns were addressed.  Time spent counseling: 15 minutes  Follow-up: prn   Benard Halsted, PharmD, [place], [place] [phone]

## 2022-09-06 NOTE — Progress Notes (Signed)
Patient ID: Linda Cooper, female    DOB: 11/28/1961  MRN: [id number]  CC: chronic ds management  Subjective: Linda Cooper is a 61 y.o. female who presents for chronic ds management Her concerns today include:  Hx of HTN, DM, morbid obesity and OA knees,   Has insurance with [employer].     DM: Results for orders placed or performed in visit on 09/06/22  POCT glycosylated hemoglobin (Hb A1C)  Result Value Ref Range   Hemoglobin A1C     HbA1c POC (<> result, manual entry)     HbA1c, POC (prediabetic range)     HbA1c, POC (controlled diabetic range) 6.4 0.0 - 7.0 %  Taking Metformin 500 mg daily. Up 11 lbs since 01/2022.   Eating late at nights.  Tries to eat last meal before 7 p.m but does not always work out that way despite good intentions.  Working on portion sizes.  Still does a lot of stress eating  Due for eye exam.  She has had no insurance but it does not cover vision.  She will pay for eye exam out-of-pocket.  HTN: Reports compliance with Hyzaar 100/25 mg daily, hydralazine 75 mg 3 times a day.  Checks BP 1-2x/day Has log book.  Wrist device.  Her most recent readings are 166/98, 148/89, 154/85, 133/72 and 152/83. Limits salt in foods If BP low she skips evening dose of Hydralazine Dealing with a lot of family stress   OA Knees:  saw ortho Dr. Marlou Sa. Had inj both knees July 2023 Did not discuss surgery option Legs feel strain at times.  Also having RT hip pain now.  She is sure that it is arthritis of the hip.  Also has arthritis in her hands.  They hurt in cold weather.  She uses Biofreeze spray on her hands which helps.  Seen in the ER in July for chest pains.  Had chest x-ray that showed some cardiomegaly.  Also had CT of the chest that showed cardiomegaly and aortic atherosclerosis.  BNP was normal.  She endorses dyspnea with minimal exertion.  She has edema in the legs.  She wears compression socks.  HM:due for flu and shingles vaccine Patient Active Problem List    Diagnosis Date Noted   Urge incontinence 08/31/2021   Pain, dental 08/16/2021   Type 2 diabetes mellitus with hyperglycemia, without long-term current use of insulin ([place]) 06/22/2020   Trigger middle finger of left hand 11/08/2019   Carpal tunnel syndrome of left wrist 07/09/2019   Morbid obesity with body mass index (BMI) of 50.0 to 59.9 in adult [facility]) 10/16/2018   Morbid obesity ([place]) 11/11/2014   PCOS (polycystic ovarian syndrome) 11/10/2014   Degenerative arthritis of knee, bilateral 11/10/2014   Diabetes mellitus without complication (HCC)    Elevated blood pressure reading in office with diagnosis of hypertension      Current Outpatient Medications on File Prior to Visit  Medication Sig Dispense Refill   albuterol (VENTOLIN HFA) 108 (90 Base) MCG/ACT inhaler Inhale 2 puffs into the lungs every 4 (four) hours as needed for wheezing. 6.7 g 1   Blood Glucose Monitoring Suppl (CONTOUR NEXT MONITOR) w/Device KIT 1 Units by Does not apply route daily. 1 kit 0   glucose blood (CONTOUR NEXT TEST) test strip USE AS INSTRUCTED ONCE DAILY 100 strip 1   hydrALAZINE (APRESOLINE) 50 MG tablet Take 1.5 tablets (75 mg total) by mouth 3 (three) times daily. 135 tablet 3   losartan-hydrochlorothiazide (HYZAAR)  100-25 MG tablet TAKE 1 TABLET BY MOUTH DAILY. 90 tablet 3   metFORMIN (GLUCOPHAGE-XR) 500 MG 24 hr tablet Take 1 tablet (500 mg total) by mouth daily with breakfast. 90 tablet 1   MICROLET LANCETS MISC Use as directed once daily 100 each 12   Multiple Vitamins-Minerals (MULTIVITAMIN WITH MINERALS) tablet Take 1 tablet by mouth daily.     naproxen (NAPROSYN) 500 MG tablet Take 1 tablet (500 mg total) by mouth 2 (two) times daily. 60 tablet 0   NON FORMULARY SEGA PRO BLADDER HEALTH 100 MG DAILY     solifenacin (VESICARE) 10 MG tablet Take 1 tablet (10 mg total) by mouth daily. 90 tablet 1   Vitamin D, Ergocalciferol, (DRISDOL) 1.25 MG (50000 UT) CAPS capsule Take 1 capsule (50,000 Units total)  by mouth every 7 (seven) days. 4 capsule 0   No current facility-administered medications on file prior to visit.    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    Son is 75.       She is motivated to lose weight and improve health by the expected death of her father  2017/01/13) and the sudden deaths of her cousin (found dead in car one month after death of his wife March 13, 2017)  and his wife (died after childbirth 13-Feb-2017)    Exercise: minimal    Social Determinants of Health   Financial Resource Strain: Not on file  Food Insecurity: Not on file  Transportation Needs: Not on file  Physical Activity: Not on file  Stress: Not on file  Social Connections: Not on file  Intimate Partner Violence: Not on file    Family History  Problem Relation Age of Onset   Hypertension Mother    Diabetes Mother    Stroke Mother    Heart disease Mother    Obesity Mother    Hypertension Father    Diabetes Father    Stroke Father    Obesity Father    Heart disease Father    Hyperlipidemia Father    Diabetes Brother    Hypertension Brother    Gout Brother    Heart disease Sister    Breast cancer Cousin    Cancer Neg Hx     Past Surgical History:  Procedure Laterality Date   ABDOMINAL HYSTERECTOMY  02/13/2005    HERNIA REPAIR  5809    Umbilical      ROS: Review of Systems Negative except as stated above  PHYSICAL EXAM: BP 138/84   Pulse 98   Ht _0  (1.575 m)   Wt (!) 308 lb 9.6 oz (140 kg)   SpO2 99%   BMI 56.44 kg/m  Wt Readings from Last 3 Encounters:  09/06/22 (!) 308 lb 9.6 oz (140 kg)  07/24/22 (!) 307 lb (139.3 kg)  01/11/22 297 lb (134.7 kg)  Repeat 147/81  Physical Exam  General appearance - alert, well appearing, and in no distress Mental status - alert, oriented to person, place, and time Neck - supple, no significant adenopathy Chest - clear to auscultation, no wheezes, rales or rhonchi, symmetric air entry Heart - normal rate, regular rhythm, normal S1, S2, no murmurs, rubs, clicks or gallops Extremities -patient is wearing compression socks.  She has 1+ edema in the legs. 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     Latest Ref Rng & Units 06/24/2022   10:15 AM 04/26/2021   11:38 AM 10/23/2020    4:21 PM  CMP  Glucose 70 - 99 mg/dL 112  101  100   BUN 8 - 23 mg/dL _0 Creatinine 0.44 - 1.00 mg/dL 0.82  0.72  0.74   Sodium 135 - 145 mmol/L 139  140  140   Potassium 3.5 - 5.1 mmol/L 4.2  4.4  4.2   Chloride 98 - 111 mmol/L 103  102  101   CO2 22 - 32 mmol/L _1 Calcium 8.9 - 10.3 mg/dL 9.3  9.2  9.9   Total Protein 6.0 - 8.5 g/dL  6.5  6.8   Total Bilirubin 0.0 - 1.2 mg/dL  0.6  0.6   Alkaline Phos 44 - 121 IU/L  99  84   AST 0 - 40 IU/L  16  14   ALT 0 - 32 IU/L  16  17    Lipid Panel     Component Value Date/Time   CHOL 152 04/26/2021 1138   TRIG 100 04/26/2021 1138   HDL 65 04/26/2021 1138   CHOLHDL 2.3 04/26/2021 1138   CHOLHDL 2.0 11/10/2014 1148   VLDL 21 11/10/2014 1148   LDLCALC 69 04/26/2021 1138    CBC    Component Value Date/Time   WBC 7.5 06/24/2022 1015   RBC 4.63 06/24/2022 1015   HGB 11.8 (L) 06/24/2022 1015   HGB 12.4 10/23/2020 1621   HCT 36.7 06/24/2022 1015   HCT 37.8 10/23/2020 1621   PLT 363 06/24/2022 1015    PLT 418 10/23/2020 1621   MCV 79.3 (L) 06/24/2022 1015   MCV 80 10/23/2020 1621   MCH 25.5 (L) 06/24/2022 1015   MCHC 32.2 06/24/2022 1015   RDW 16.1 (H) 06/24/2022 1015   RDW 15.7 (H) 10/23/2020 1621   LYMPHSABS 2.5 06/24/2022 1015   LYMPHSABS 3.0 12/29/2018 1546   MONOABS 0.7 06/24/2022 1015   EOSABS 0.2 06/24/2022 1015   EOSABS 0.4 12/29/2018 1546   BASOSABS 0.1 06/24/2022 1015   BASOSABS 0.1 12/29/2018 1546    ASSESSMENT AND PLAN: 1. Type 2 diabetes mellitus with morbid obesity ([place]) At goal. Dietary counseling given. Discussed trying her with Trulicity for diabetes and more so to help with weight loss.  It will help decrease her appetite.  Went over with her how the medication works and possible side effects including nausea, diarrhea.  Advised of potential side effect of causing acute pancreatitis.  Advised that if she develops any vomiting with pain in the upper abdomen she should stop the medication and come in to be evaluated.  Once she starts the Trulicity, she should stop the metformin.  Patient agreeable with plan.  Clinical pharmacist to see her today to teach Trulicity injection. - POCT glycosylated hemoglobin (Hb A1C) - Microalbumin / creatinine urine ratio - Dulaglutide (TRULICITY) 1.64 WX/0.3PN SOPN; Inject 0.75 mg into the skin once a week.  Dispense: 2 mL; Refill: 2 - Ambulatory referral to Ophthalmology - Lipid panel - Hepatic Function Panel  2. Hypertension associated with type 2 diabetes mellitus ([place]) Not at goal.  Recommend increasing hydralazine to 100 mg 3 times a day. - hydrALAZINE (APRESOLINE) 100 MG tablet; Take 1 tablet (100 mg total) by mouth 3 (three) times daily.  Dispense: 90 tablet; Refill: 5  3. Primary osteoarthritis of both knees Discussed the importance of weight loss. Encouraged her to try to move more.  She has gym membership with access to the pool.  Encouraged her to start going and doing some water aerobics.  4. Primary osteoarthritis of  both hands Use Tylenol over-the-counter as needed.  5. Edema of both legs 6. DOE (dyspnea on exertion) 7. Cardiomegaly -Given her symptoms, findings and cardiomegaly on imaging, I recommend doing an echo to evaluate heart function.  Patient agreeable. - ECHOCARDIOGRAM COMPLETE; Future   8. Aortic atherosclerosis (HCC) - Lipid panel - Hepatic Function Panel  9. Need for influenza vaccination Given today.  10. Need for shingles vaccine Given prescription for Shingrix vaccine for her to take to an outside pharmacy. - Zoster Vaccine Adjuvanted [facility]) injection; Inject 0.5 mLs into the muscle once for 1 dose.  Dispense: 0.5 mL; Refill: 0    Patient was given the opportunity to ask questions.  Patient verbalized understanding of the plan and was able to repeat key elements of the plan.   This documentation was completed using Radio producer.  Any transcriptional errors are unintentional.  Orders Placed This Encounter  Procedures   POCT glycosylated hemoglobin (Hb A1C)     Requested Prescriptions    No prescriptions requested or ordered in this encounter    No follow-ups on file.  Karle Plumber, MD, FACP

## 2022-09-06 NOTE — Patient Instructions (Signed)
Your blood pressure is not at goal.  Increase the hydralazine to 100 mg 3 times a day.  I have submitted referral for you to see Dr. Katy Fitch for your diabetic eye exam.  We have started you on Trulicity for diabetes and to help with weight loss.  Once you start the medication, you can stop the metformin.  We have referred you for study on your heart: Echocardiogram to evaluate heart function.  You can take Tylenol as needed for arthritis pain.  You can also use Voltaren gel which can be purchased over-the-counter.

## 2022-09-07 LAB — HEPATIC FUNCTION PANEL
ALT: 17 IU/L (ref 0–32)
AST: 20 IU/L (ref 0–40)
Albumin: 3.7 g/dL — ABNORMAL LOW (ref 3.9–4.9)
Alkaline Phosphatase: 93 IU/L (ref 44–121)
Bilirubin Total: 0.6 mg/dL (ref 0.0–1.2)
Bilirubin, Direct: 0.17 mg/dL (ref 0.00–0.40)
Total Protein: 6.7 g/dL (ref 6.0–8.5)

## 2022-09-07 LAB — LIPID PANEL
Chol/HDL Ratio: 2.1 ratio (ref 0.0–4.4)
Cholesterol, Total: 148 mg/dL (ref 100–199)
HDL: 72 mg/dL (ref >39)
LDL Chol Calc (NIH): 58 mg/dL (ref 0–99)
Triglycerides: 97 mg/dL (ref 0–149)
VLDL Cholesterol Cal: 18 mg/dL (ref 5–40)

## 2022-09-07 LAB — MICROALBUMIN / CREATININE URINE RATIO
Creatinine, Urine: 137.2 mg/dL
Microalb/Creat Ratio: 3 mg/g creat (ref 0–29)
Microalbumin, Urine: 4.7 ug/mL

## 2022-09-09 ENCOUNTER — Other Ambulatory Visit: Payer: Self-pay

## 2022-09-10 ENCOUNTER — Other Ambulatory Visit: Payer: Self-pay

## 2022-09-17 ENCOUNTER — Other Ambulatory Visit: Payer: Self-pay

## 2022-09-24 ENCOUNTER — Other Ambulatory Visit: Payer: Self-pay

## 2022-09-25 ENCOUNTER — Other Ambulatory Visit: Payer: Self-pay

## 2022-10-04 ENCOUNTER — Other Ambulatory Visit: Payer: Self-pay

## 2022-10-17 ENCOUNTER — Other Ambulatory Visit: Payer: Self-pay

## 2022-10-28 ENCOUNTER — Other Ambulatory Visit: Payer: Self-pay

## 2022-10-29 ENCOUNTER — Other Ambulatory Visit: Payer: Self-pay

## 2022-11-04 ENCOUNTER — Other Ambulatory Visit: Payer: Self-pay

## 2022-11-04 MED ORDER — TRULICITY 0.75 MG/0.5ML ~~LOC~~ SOAJ
SUBCUTANEOUS | 0 refills | Status: DC
  Filled 2022-11-04: qty 2, 28d supply, fill #0

## 2022-11-06 ENCOUNTER — Ambulatory Visit (HOSPITAL_COMMUNITY)
Admission: RE | Admit: 2022-11-06 | Discharge: 2022-11-06 | Disposition: A | Discharge status: Home / Self Care | Payer: 59 | Source: Ambulatory Visit | Attending: Internal Medicine | Admitting: Internal Medicine

## 2022-11-06 ENCOUNTER — Other Ambulatory Visit: Payer: Self-pay

## 2022-11-06 DIAGNOSIS — R0609 Other forms of dyspnea: Secondary | ICD-10-CM

## 2022-11-06 DIAGNOSIS — R0600 Dyspnea, unspecified: Secondary | ICD-10-CM | POA: Insufficient documentation

## 2022-11-06 DIAGNOSIS — I351 Nonrheumatic aortic (valve) insufficiency: Secondary | ICD-10-CM | POA: Insufficient documentation

## 2022-11-06 DIAGNOSIS — K219 Gastro-esophageal reflux disease without esophagitis: Secondary | ICD-10-CM | POA: Insufficient documentation

## 2022-11-06 DIAGNOSIS — I517 Cardiomegaly: Secondary | ICD-10-CM | POA: Diagnosis not present

## 2022-11-06 DIAGNOSIS — I10 Essential (primary) hypertension: Secondary | ICD-10-CM | POA: Insufficient documentation

## 2022-11-06 DIAGNOSIS — E119 Type 2 diabetes mellitus without complications: Secondary | ICD-10-CM | POA: Diagnosis not present

## 2022-11-06 LAB — ECHOCARDIOGRAM COMPLETE
Area-P 1/2: 3.08 cm2
S' Lateral: 3.3 cm

## 2022-11-06 NOTE — Progress Notes (Signed)
  Echocardiogram 2D Echocardiogram has been performed.  Leta Jungling M 11/06/2022, 3:45 PM

## 2022-11-07 ENCOUNTER — Encounter: Payer: Self-pay | Admitting: Internal Medicine

## 2022-11-07 ENCOUNTER — Other Ambulatory Visit: Payer: Self-pay | Admitting: Internal Medicine

## 2022-11-08 ENCOUNTER — Other Ambulatory Visit: Payer: Self-pay | Admitting: Internal Medicine

## 2022-11-08 MED ORDER — RYBELSUS 3 MG PO TABS
3.0000 mg | ORAL_TABLET | Freq: Every day | ORAL | 2 refills | Status: DC
  Filled 2022-11-08 – 2022-11-13 (×2): qty 30, 30d supply, fill #0

## 2022-11-09 ENCOUNTER — Telehealth: Payer: Self-pay | Admitting: Internal Medicine

## 2022-11-09 DIAGNOSIS — R0609 Other forms of dyspnea: Secondary | ICD-10-CM

## 2022-11-09 DIAGNOSIS — I517 Cardiomegaly: Secondary | ICD-10-CM

## 2022-11-09 NOTE — Telephone Encounter (Signed)
PC placed to pt this evening to go over results of echo that was done for c/o DOE, LE edema and cardiomegaly seen on CXR and CTA of chest.  Patient advised that the echo showed that her heart function was slightly decreased at 50 to 55% with normal being 60 to 65%.  She did have some changes likely due to hypertension.  I recommend referral to cardiology.  Patient is agreeable to this.  Stressed the importance of good blood pressure control.  Patient states her blood pressure has been better.  Blood pressure this morning was 133/80.Linda Cooper

## 2022-11-11 ENCOUNTER — Other Ambulatory Visit: Payer: Self-pay

## 2022-11-12 ENCOUNTER — Other Ambulatory Visit: Payer: Self-pay

## 2022-11-13 ENCOUNTER — Other Ambulatory Visit: Payer: Self-pay

## 2022-11-13 ENCOUNTER — Telehealth: Payer: Self-pay

## 2022-11-13 NOTE — Telephone Encounter (Signed)
Rybelsus prior authorization submitted to ins today via covermymeds Key: [facility]

## 2022-11-14 ENCOUNTER — Encounter: Payer: Self-pay | Admitting: Internal Medicine

## 2022-11-14 NOTE — Telephone Encounter (Signed)
Prior authorization denied-pt has history of Trulicity but not Victoza. Must have history of both to meet eligibility criteria for the Rybelsus approval.

## 2022-11-19 ENCOUNTER — Other Ambulatory Visit: Payer: Self-pay

## 2022-11-20 ENCOUNTER — Other Ambulatory Visit: Payer: Self-pay

## 2022-11-20 ENCOUNTER — Ambulatory Visit: Payer: 59 | Admitting: Cardiology

## 2022-11-26 ENCOUNTER — Other Ambulatory Visit: Payer: Self-pay

## 2022-11-27 ENCOUNTER — Other Ambulatory Visit: Payer: Self-pay

## 2022-11-27 ENCOUNTER — Encounter: Payer: Self-pay | Admitting: Cardiology

## 2022-11-27 ENCOUNTER — Ambulatory Visit: Payer: 59 | Attending: Cardiology | Admitting: Cardiology

## 2022-11-27 VITALS — BP 185/92 | HR 84 | Ht 62.0 in | Wt 293.6 lb

## 2022-11-27 DIAGNOSIS — R0609 Other forms of dyspnea: Secondary | ICD-10-CM

## 2022-11-27 DIAGNOSIS — R0683 Snoring: Secondary | ICD-10-CM | POA: Diagnosis not present

## 2022-11-27 DIAGNOSIS — I10 Essential (primary) hypertension: Secondary | ICD-10-CM | POA: Diagnosis not present

## 2022-11-27 MED ORDER — ATORVASTATIN CALCIUM 10 MG PO TABS
10.0000 mg | ORAL_TABLET | Freq: Every day | ORAL | 3 refills | Status: DC
  Filled 2022-11-27 – 2022-12-17 (×2): qty 30, 30d supply, fill #0
  Filled 2023-04-17 – 2023-04-30 (×2): qty 30, 30d supply, fill #1

## 2022-11-27 MED ORDER — AMLODIPINE BESYLATE 5 MG PO TABS
5.0000 mg | ORAL_TABLET | Freq: Every day | ORAL | 3 refills | Status: DC
  Filled 2022-11-27 – 2022-12-17 (×2): qty 30, 30d supply, fill #0
  Filled 2023-01-15: qty 30, 30d supply, fill #1
  Filled 2023-02-25: qty 30, 30d supply, fill #2
  Filled 2023-03-28: qty 30, 30d supply, fill #3
  Filled 2023-04-30 – 2023-05-15 (×3): qty 30, 30d supply, fill #4
  Filled 2023-07-02: qty 30, 30d supply, fill #5
  Filled 2023-08-05: qty 30, 30d supply, fill #6
  Filled 2023-09-11: qty 30, 30d supply, fill #7
  Filled 2023-10-30 (×2): qty 30, 30d supply, fill #8

## 2022-11-27 NOTE — Progress Notes (Signed)
Cardiology Office Note:    Date:  11/27/2022   ID:  Linda, Cooper [birth year], MRN [id number]  PCP:  Ladell Pier, MD  Cardiologist:  None  Electrophysiologist:  None   Referring MD: Ladell Pier, MD   Chief Complaint  Patient presents with   Shortness of Breath    History of Present Illness:    Linda Cooper is a 61 y.o. female with a hx of asthma, diabetes, hypertension who is referred by Dr. Wynetta Emery for evaluation of dyspnea.  She reports she has been having progressively worsening dyspnea over the last months.  Reports short of breath with minimal exertion.  She denies any chest pain.  Also having lightheadedness but denies any syncope.  No palpitations.  Does report she has lower extremity edema.  She has not been exercising.  No smoking history.  Mother had CVA in 68s, father had multiple CVAs with first in his 32s.  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  Echocardiogram 11/06/2022 showed EF 50 to 71%, grade 2 diastolic dysfunction, normal RV function, no significant valvular disease.   Past Medical History:  Diagnosis Date   'light-for-dates' infant with signs of fetal malnutrition    Anemia    Arthritis    Asthma    Diabetes mellitus without complication ([place]) Dx 2197   GERD (gastroesophageal reflux disease)    Hypertension Dx 1990   Knee pain, bilateral    Obesity    Rheumatic fever childhood    has intermittent murmur and hand arthritis     Past Surgical History:  Procedure Laterality Date   ABDOMINAL HYSTERECTOMY  2006    HERNIA REPAIR  5883    Umbilical     Current Medications: Current Meds  Medication Sig   albuterol (VENTOLIN HFA) 108 (90 Base) MCG/ACT inhaler Inhale 2 puffs into the lungs every 4 (four) hours as needed for wheezing.   Blood Glucose Monitoring Suppl (CONTOUR NEXT MONITOR) w/Device KIT 1 Units by Does not apply route daily.   glucose blood (CONTOUR NEXT TEST) test strip USE AS INSTRUCTED ONCE DAILY    hydrALAZINE (APRESOLINE) 100 MG tablet Take 1 tablet (100 mg total) by mouth 3 (three) times daily.   losartan-hydrochlorothiazide (HYZAAR) 100-25 MG tablet TAKE 1 TABLET BY MOUTH DAILY.   metFORMIN (GLUCOPHAGE-XR) 500 MG 24 hr tablet Take 1 tablet (500 mg total) by mouth daily with breakfast.   MICROLET LANCETS MISC Use as directed once daily   Multiple Vitamins-Minerals (MULTIVITAMIN WITH MINERALS) tablet Take 1 tablet by mouth daily.   naproxen (NAPROSYN) 500 MG tablet Take 1 tablet (500 mg total) by mouth 2 (two) times daily.   NON FORMULARY SEGA PRO BLADDER HEALTH 100 MG DAILY   solifenacin (VESICARE) 10 MG tablet Take 1 tablet (10 mg total) by mouth daily.   Vitamin D, Ergocalciferol, (DRISDOL) 1.25 MG (50000 UT) CAPS capsule Take 1 capsule (50,000 Units total) by mouth every 7 (seven) days.     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    Son is 37.       She is motivated to lose weight and improve health by the expected death of her father  01-22-17) and the sudden deaths of her cousin (found dead in car one month after death of his wife 22-Mar-2017)  and his wife (died after childbirth 2017/02/22)    Exercise: minimal    Social Determinants of Health   Financial Resource Strain: Not on file  Food Insecurity: Not on file  Transportation Needs: Not on file  Physical Activity: Not on file  Stress: Not on  file  Social Connections: Not on file     Family History: The patient's family history includes Breast cancer in her cousin; Diabetes in her brother, father, and mother; Gout in her brother; Heart disease in her father, mother, and sister; Hyperlipidemia in her father; Hypertension in her brother, father, and mother; Obesity in her father and mother; Stroke in her father and mother. There is no history of Cancer.  ROS:   Please see the history of present illness.     All other systems reviewed and are negative.  EKGs/Labs/Other Studies Reviewed:    The following studies were reviewed today:   EKG:   11/27/2022: Normal sinus rhythm, rate 84, low voltage, nonspecific T wave flattening  Recent Labs: 06/24/2022: B Natriuretic Peptide 19.0; BUN 23; Creatinine, Ser 0.82; Hemoglobin 11.8; Platelets 363; Potassium 4.2; Sodium 139; TSH 1.422 09/06/2022: ALT 17  Recent Lipid Panel    Component Value Date/Time   CHOL 148 09/06/2022 1559   TRIG 97 09/06/2022 1559   HDL 72 09/06/2022 1559   CHOLHDL 2.1 09/06/2022 1559   CHOLHDL 2.0 11/10/2014 1148   VLDL 21 11/10/2014 1148   LDLCALC 58 09/06/2022 1559    Physical Exam:    VS:  BP (!) 185/92   Pulse 84   Ht _0  (1.575 m)   Wt 293 lb 9.6 oz (133.2 kg)   SpO2 98%   BMI 53.70 kg/m     Wt Readings from Last 3 Encounters:  11/27/22 293 lb 9.6 oz (133.2 kg)  09/06/22 (!) 308 lb 9.6 oz (140 kg)  07/24/22 (!) 307 lb (139.3 kg)     GEN:   in no acute distress HEENT: Normal NECK: No JVD; No carotid bruits LYMPHATICS: No lymphadenopathy CARDIAC: RRR, no murmurs, rubs, gallops RESPIRATORY:  Clear to auscultation without rales, wheezing or rhonchi  ABDOMEN: Soft, non-tender, non-distended MUSCULOSKELETAL:  No edema; No deformity  SKIN: Warm and dry NEUROLOGIC:  Alert and oriented x 3 PSYCHIATRIC:  Normal affect   ASSESSMENT:    1. DOE (dyspnea on exertion)   2. Essential hypertension   3. Morbid obesity ([place])   4. Snoring     PLAN:    Dyspnea: reports progressive dyspnea on exertion.  Could represent anginal equivalent, as multiple CAD risk factors (age, hypertension, T2DM).  Echocardiogram 11/06/2022 showed EF 50 to 72%, grade 2 diastolic dysfunction, normal RV function, no significant valvular disease. -Not a good candidate for coronary CTA given BMI, recommend stress PET to evaluate for ischemia  Hypertension: On hydralazine 100 mg 3 times daily, losartan-HCTZ 100-25 mg daily.  BP 185/92 in clinic.  On recheck it was down to  144/82.  She is on losartan-HCTZ 100-25 mg daily, hydralazine 100 mg 3 times daily.  Will add amlodipine 5 mg daily.  Asked to check BP daily for next 2 weeks and bring log and home BP monitor to calibrate to pharmacy hypertension clinic appointment in 2 weeks.  Suspect untreated OSA contributing to high BP.  Will check [place] sleep study  T2DM: On metformin and Rybelsus.  A1c 6.4% on 09/06/2022.  Recommend starting statin given history of diabetes, LDL is 58 on 09/06/2022, will start atorvastatin 10 mg daily  Snoring: Check sleep study.  [place] 4  Morbid obesity: Body mass index is 53.7 kg/m.  Diet/exercise recommended   RTC in 3 months  Shared Decision Making/Informed Consent The risks [chest pain, shortness of breath, cardiac arrhythmias, dizziness, blood pressure fluctuations, myocardial infarction, stroke/transient ischemic attack, nausea, vomiting, allergic reaction, radiation exposure, metallic taste sensation and life-threatening complications (estimated to be 1 in 10,000)], benefits (risk stratification, diagnosing coronary artery disease, treatment guidance) and alternatives of a cardiac PET stress test were discussed in detail with Ms. Alvidrez and she agrees to proceed.    Medication Adjustments/Labs and Tests Ordered: Current medicines are reviewed at length with the patient today.  Concerns regarding medicines are outlined above.  No orders of the defined types were placed  in this encounter.  No orders of the defined types were placed in this encounter.   There are no Patient Instructions on file for this visit.   Signed, Donato Heinz, MD  11/27/2022 2:27 PM    [facility]

## 2022-11-27 NOTE — Patient Instructions (Signed)
Medication Instructions:  START amlodipine 5 mg daily START atorvastatin (Lipitor) 10 mg daily  Please check your blood pressure at home daily, write it down.  Bring blood pressure cuff and blood pressure log to appointment with pharmacist  *If you need a refill on your cardiac medications before your next appointment, please call your pharmacy*  Testing/Procedures: CARDIAC PET- Your physician has requested that you have a Cardiac Pet Stress Test. This testing is completed at [facility] [place], [place] [address]). The schedulers will call you to get this scheduled. Please follow instructions below and call the office with any questions/concerns [phone]).  WatchPAT?  Is a FDA cleared portable home sleep study test that uses a watch and 3 points of contact to monitor 7 different channels, including your heart rate, oxygen saturations, body position, snoring, and chest motion.  The study is easy to use from the comfort of your own home and accurately detect sleep apnea.  Before bed, you attach the chest sensor, attached the sleep apnea bracelet to your nondominant hand, and attach the finger probe.  After the study, the raw data is downloaded from the watch and scored for apnea events.   For more information: [email or web address]  Patient Testing Instructions:  Do not put battery into the device until bedtime when you are ready to begin the test. Please call the support number if you need assistance after following the instructions below: 24 hour support line- [phone] or ITAMAR support at [phone] (option 2)  Download the IntelWatchPAT One" app through the google play store or App Store  Be sure to turn on or enable access to bluetooth in settlings on your smartphone/ device  Make sure no other bluetooth devices are on and within the vicinity of your smartphone/ device and WatchPAT watch during testing.  Make sure to  leave your smart phone/ device plugged in and charging all night.  When ready for bed:  Follow the instructions step by step in the WatchPAT One App to activate the testing device. For additional instructions, including video instruction, visit the WatchPAT One video on Youtube. You can search for WatchPat One within Youtube (video is 4 minutes and 18 seconds) or enter: https://youtube/watch?[email or web address]_vbiwxE Please note: You will be prompted to enter a Pin to connect via bluetooth when starting the test. The PIN will be assigned to you when you receive the test.  The device is disposable, but it recommended that you retain the device until you receive a call letting you know the study has been received and the results have been interpreted.  We will let you know if the study did not transmit to [place] properly after the test is completed. You do not need to call us to confirm the receipt of the test.  Please complete the test within 48 hours of receiving PIN.   Frequently Asked Questions:  What is Watch Dennie Bible one?  A single use fully disposable home sleep apnea testing device and will not need to be returned after completion.  What are the requirements to use WatchPAT one?  The be able to have a successful watchpat one sleep study, you should have your Watch pat one device, your smart phone, watch pat one app, your PIN number and Internet access What type of phone do I need?  You should have a smart phone that uses Android 5.1 and above or any Iphone with IOS 10 and above How can I download the WatchPAT one app?  Based on your device type search for WatchPAT one app either in google play for android devices or APP store for Iphone's Where will I get my PIN for the study?  Your PIN will be provided by your physician's office. It is used for authentication and if you lose/forget your PIN, please reach out to your providers office.  I do not have Internet at home. Can I do WatchPAT one study?  WatchPAT One  needs Internet connection throughout the night to be able to transmit the sleep data. You can use your home/local internet or your cellular's data package. However, it is always recommended to use home/local Internet. It is estimated that between 20MB-30MB will be used with each study.However, the application will be looking for 80MB space in the phone to start the study.  What happens if I lose internet or bluetooth connection?  During the internet disconnection, your phone will not be able to transmit the sleep data. All the data, will be stored in your phone. As soon as the internet connection is back on, the phone will being sending the sleep data. During the bluetooth disconnection, WatchPAT one will not be able to to send the sleep data to your phone. Data will be kept in the [facility] one until two devices have bluetooth connection back on. As soon as the connection is back on, WatchPAT one will send the sleep data to the phone.  How long do I need to wear the WatchPAT one?  After you start the study, you should wear the device at least 6 hours.  How far should I keep my phone from the device?  During the night, your phone should be within 15 feet.  What happens if I leave the room for restroom or other reasons?  Leaving the room for any reason will not cause any problem. As soon as your get back to the room, both devices will reconnect and will continue to send the sleep data. Can I use my phone during the sleep study?  Yes, you can use your phone as usual during the study. But it is recommended to put your watchpat one on when you are ready to go to bed.  How will I get my study results?  A soon as you completed your study, your sleep data will be sent to the provider. They will then share the results with you when they are ready.   Follow-Up: At [facility], you and your health needs are our priority.  As part of our continuing mission to provide you with exceptional heart care, we  have created designated Provider Care Teams.  These Care Teams include your primary Cardiologist (physician) and Advanced Practice Providers (APPs -  Physician Assistants and Nurse Practitioners) who all work together to provide you with the care you need, when you need it.  We recommend signing up for the patient portal called "MyChart".  Sign up information is provided on this After Visit Summary.  MyChart is used to connect with patients for Virtual Visits (Telemedicine).  Patients are able to view lab/test results, encounter notes, upcoming appointments, etc.  Non-urgent messages can be sent to your provider as well.   To learn more about what you can do with MyChart, go to [email or web address].    Your next appointment:   2-3 weeks with pharmacist (Blood pressure follow up) 3 months with Dr. Gardiner Rhyme  Other Instructions How to Prepare for Your Cardiac PET/CT Stress Test:  1. Please do not take these  medications before your test:   Medications that may interfere with the cardiac pharmacological stress agent (ex. nitrates - including erectile dysfunction medications, isosorbide mononitrate or beta-blockers) the day of the exam. (Erectile dysfunction medication should be held for at least 72 hrs prior to test) Theophylline containing medications for 12 hours. Dipyridamole 48 hours prior to the test. Your remaining medications may be taken with water.  2. Nothing to eat or drink, except water, 3 hours prior to arrival time.   NO caffeine/decaffeinated products, or chocolate 12 hours prior to arrival.  3. NO perfume, cologne or lotion  4. Total time is 1 to 2 hours; you may want to bring reading material for the waiting time.  5. Please report to Admitting at the [facility] Main Entrance 30 minutes early for your test.  [place], [place] [address]  Diabetic Preparation:  Hold oral medications. You may take NPH and Lantus insulin. Do not take Humalog or  Humulin R (Regular Insulin) the day of your test. Check blood sugars prior to leaving the house. If able to eat breakfast prior to 3 hour fasting, you may take all medications, including your insulin, Do not worry if you miss your breakfast dose of insulin - start at your next meal.  IF YOU THINK YOU MAY BE PREGNANT, OR ARE NURSING PLEASE INFORM THE TECHNOLOGIST.  In preparation for your appointment, medication and supplies will be purchased.  Appointment availability is limited, so if you need to cancel or reschedule, please call the Radiology Department at [phone]  24 hours in advance to avoid a cancellation fee of $100.00  What to Expect After you Arrive:  Once you arrive and check in for your appointment, you will be taken to a preparation room within the Radiology Department.  A technologist or Nurse will obtain your medical history, verify that you are correctly prepped for the exam, and explain the procedure.  Afterwards,  an IV will be started in your arm and electrodes will be placed on your skin for EKG monitoring during the stress portion of the exam. Then you will be escorted to the PET/CT scanner.  There, staff will get you positioned on the scanner and obtain a blood pressure and EKG.  During the exam, you will continue to be connected to the EKG and blood pressure machines.  A small, safe amount of a radioactive tracer will be injected in your IV to obtain a series of pictures of your heart along with an injection of a stress agent.    After your Exam:  It is recommended that you eat a meal and drink a caffeinated beverage to counter act any effects of the stress agent.  Drink plenty of fluids for the remainder of the day and urinate frequently for the first couple of hours after the exam.  Your doctor will inform you of your test results within 7-10 business days.  For questions about your test or how to prepare for your test, please call: Marchia Bond, Cardiac Imaging Nurse  Navigator  Gordy Clement, Cardiac Imaging Nurse Navigator Office: [phone]

## 2022-11-28 ENCOUNTER — Telehealth: Payer: Self-pay

## 2022-11-28 ENCOUNTER — Telehealth: Payer: Self-pay | Admitting: *Deleted

## 2022-11-28 NOTE — Telephone Encounter (Signed)
Linda Cooper notified ok to activate itamar device.

## 2022-11-28 NOTE — Telephone Encounter (Signed)
Pt returning call

## 2022-11-28 NOTE — Telephone Encounter (Signed)
I called the patient to speak with her about the [place] Device. I was unable to speak to the patient, I LVM for her to return my call.

## 2022-12-03 ENCOUNTER — Other Ambulatory Visit: Payer: Self-pay

## 2022-12-17 ENCOUNTER — Other Ambulatory Visit: Payer: Self-pay

## 2022-12-17 ENCOUNTER — Other Ambulatory Visit (HOSPITAL_BASED_OUTPATIENT_CLINIC_OR_DEPARTMENT_OTHER): Payer: Self-pay

## 2022-12-25 ENCOUNTER — Ambulatory Visit: Payer: 59

## 2022-12-27 ENCOUNTER — Other Ambulatory Visit: Payer: Self-pay

## 2022-12-31 ENCOUNTER — Ambulatory Visit (HOSPITAL_COMMUNITY): Payer: 59

## 2023-01-10 ENCOUNTER — Ambulatory Visit: Payer: 59

## 2023-01-10 ENCOUNTER — Ambulatory Visit: Payer: 59 | Attending: Internal Medicine | Admitting: Internal Medicine

## 2023-01-10 ENCOUNTER — Encounter: Payer: Self-pay | Admitting: Internal Medicine

## 2023-01-10 DIAGNOSIS — E1159 Type 2 diabetes mellitus with other circulatory complications: Secondary | ICD-10-CM | POA: Diagnosis not present

## 2023-01-10 DIAGNOSIS — R0683 Snoring: Secondary | ICD-10-CM | POA: Diagnosis not present

## 2023-01-10 DIAGNOSIS — K5900 Constipation, unspecified: Secondary | ICD-10-CM | POA: Diagnosis not present

## 2023-01-10 DIAGNOSIS — I152 Hypertension secondary to endocrine disorders: Secondary | ICD-10-CM | POA: Diagnosis not present

## 2023-01-10 DIAGNOSIS — R5383 Other fatigue: Secondary | ICD-10-CM

## 2023-01-10 DIAGNOSIS — D509 Iron deficiency anemia, unspecified: Secondary | ICD-10-CM | POA: Diagnosis not present

## 2023-01-10 DIAGNOSIS — E1169 Type 2 diabetes mellitus with other specified complication: Secondary | ICD-10-CM

## 2023-01-10 DIAGNOSIS — Z1211 Encounter for screening for malignant neoplasm of colon: Secondary | ICD-10-CM | POA: Diagnosis not present

## 2023-01-10 DIAGNOSIS — Z6841 Body Mass Index (BMI) 40.0 and over, adult: Secondary | ICD-10-CM

## 2023-01-10 LAB — POCT GLYCOSYLATED HEMOGLOBIN (HGB A1C): HbA1c, POC (controlled diabetic range): 5.9 % (ref 0.0–7.0)

## 2023-01-10 LAB — GLUCOSE, POCT (MANUAL RESULT ENTRY): POC Glucose: 111 mg/dl — AB (ref 70–99)

## 2023-01-10 NOTE — Progress Notes (Signed)
Patient ID: Linda Cooper, female    DOB: [birth year]  MRN: [id number]  CC: Diabetes (Dm f/u. Med refill. Neoma Laming received flu vax)   Subjective: Linda Cooper is a 62 y.o. female who presents for chronic ds management Her concerns today include:  Hx of HTN, DM, morbid obesity and OA knees,    DM:   Results for orders placed or performed in visit on 01/10/23  POCT glucose (manual entry)  Result Value Ref Range   POC Glucose 111 (A) 70 - 99 mg/dl  POCT glycosylated hemoglobin (Hb A1C)  Result Value Ref Range   Hemoglobin A1C     HbA1c POC (<> result, manual entry)     HbA1c, POC (prediabetic range)     HbA1c, POC (controlled diabetic range) 5.9 0.0 - 7.0 %   Prescribed Trulicity on last visit.  Took for 2 mths then insurance stopped paying for it due to her deductible.  Now back on Metformin.  Lost 19 lbs since last visit Did well with Trulicity when she was on it for the 2 mths but now her appetite has increased again.  Reports significant constipation though while on the Trulicity.  Now stools are still very hard; going Q 2 days.  Drinking a lot of water. She was eating prunes and taking Smooth Move which help alot When she has to have BM, she gets pain in lower back.  HTN: Compliant with taking hydralazine 100 mg 3 times a day, amlodipine 5 mg daily and Hyzaar 100/25 mg daily. Referred to cardiology on last visit for DOE and cardiomegaly seen on x-ray and CT scan.  Echo revealed EF of 50 to 55% with no regional wall motion abnormality and grade 2 diastolic dysfunction.  She was seen by cardiologist Dr. Gardiner Rhyme.  He ordered stress test on her but patient put off having it done due to cost and limited finances at this time.  He recommended statin therapy and prescribed atorvastatin 10 mg daily.  Patient has not started taking it as yet.  She wanted to know why she should take it if her cholesterol levels are normal  Having financial difficulty.  Work hrs have been cut.   Looking for new job.  Would like to retire but needs to work.    Complains of fatigue.  Tries to get good sleep but worked night shift for yrs.  She does not have a bed partner but reports being told by her sister that she snores like a freight train.  HM:  due for colon CA screen.  Still has rxn for Shingles vaccine.  Plans to get it soon Patient Active Problem List   Diagnosis Date Noted   Urge incontinence 08/31/2021   Pain, dental 08/16/2021   Type 2 diabetes mellitus with hyperglycemia, without long-term current use of insulin ([place]) 06/22/2020   Trigger middle finger of left hand 11/08/2019   Carpal tunnel syndrome of left wrist 07/09/2019   Morbid obesity with body mass index (BMI) of 50.0 to 59.9 in adult [facility]) 10/16/2018   Morbid obesity ([place]) 11/11/2014   PCOS (polycystic ovarian syndrome) 11/10/2014   Degenerative arthritis of knee, bilateral 11/10/2014   Diabetes mellitus without complication (HCC)    Elevated blood pressure reading in office with diagnosis of hypertension      Current Outpatient Medications on File Prior to Visit  Medication Sig Dispense Refill   albuterol (VENTOLIN HFA) 108 (90 Base) MCG/ACT inhaler Inhale 2 puffs into the lungs every 4 (four)  hours as needed for wheezing. 6.7 g 1   amLODipine (NORVASC) 5 MG tablet Take 1 tablet (5 mg total) by mouth daily. 90 tablet 3   atorvastatin (LIPITOR) 10 MG tablet Take 1 tablet (10 mg total) by mouth daily. 90 tablet 3   Blood Glucose Monitoring Suppl (CONTOUR NEXT MONITOR) w/Device KIT 1 Units by Does not apply route daily. 1 kit 0   glucose blood (CONTOUR NEXT TEST) test strip USE AS INSTRUCTED ONCE DAILY 100 strip 1   hydrALAZINE (APRESOLINE) 100 MG tablet Take 1 tablet (100 mg total) by mouth 3 (three) times daily. 90 tablet 5   losartan-hydrochlorothiazide (HYZAAR) 100-25 MG tablet TAKE 1 TABLET BY MOUTH DAILY. 90 tablet 3   metFORMIN (GLUCOPHAGE-XR) 500 MG 24 hr tablet Take 1 tablet (500 mg total) by mouth  daily with breakfast. 90 tablet 1   MICROLET LANCETS MISC Use as directed once daily 100 each 12   Multiple Vitamins-Minerals (MULTIVITAMIN WITH MINERALS) tablet Take 1 tablet by mouth daily.     naproxen (NAPROSYN) 500 MG tablet Take 1 tablet (500 mg total) by mouth 2 (two) times daily. 60 tablet 0   NON FORMULARY SEGA PRO BLADDER HEALTH 100 MG DAILY     solifenacin (VESICARE) 10 MG tablet Take 1 tablet (10 mg total) by mouth daily. 90 tablet 1   Vitamin D, Ergocalciferol, (DRISDOL) 1.25 MG (50000 UT) CAPS capsule Take 1 capsule (50,000 Units total) by mouth every 7 (seven) days. 4 capsule 0   No current facility-administered medications on file prior to visit.    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    Son is 72.       She is motivated to lose weight and improve health by the expected death of her father  2016/12/17) and the sudden deaths of her cousin (found dead in car one month after death of his wife 14-Feb-2017)  and his wife (died after childbirth 01/17/17)    Exercise: minimal    Social Determinants of Health   Financial Resource Strain: Not on file  Food Insecurity: Not on file  Transportation Needs: Not on file  Physical Activity: Not on file  Stress: Not on file  Social Connections: Not on file  Intimate Partner Violence:  Not on file    Family History  Problem Relation Age of Onset   Hypertension Mother    Diabetes Mother    Stroke Mother    Heart disease Mother    Obesity Mother    Hypertension Father    Diabetes Father    Stroke Father    Obesity Father    Heart disease Father    Hyperlipidemia  Father    Diabetes Brother    Hypertension Brother    Gout Brother    Heart disease Sister    Breast cancer Cousin    Cancer Neg Hx     Past Surgical History:  Procedure Laterality Date   ABDOMINAL HYSTERECTOMY  2006    HERNIA REPAIR  9892    Umbilical     ROS: Review of Systems Negative except as stated above  PHYSICAL EXAM: BP 130/83 (BP Location: Left Arm, Patient Position: Sitting, Cuff Size: Large)   Pulse 98   Temp 98.2 F (36.8 C) (Oral)   Ht 5\' 2"  (1.575 m)   Wt 289 lb (131.1 kg)   SpO2 98%   BMI 52.86 kg/m   Wt Readings from Last 3 Encounters:  01/10/23 289 lb (131.1 kg)  11/27/22 293 lb 9.6 oz (133.2 kg)  09/06/22 (!) 308 lb 9.6 oz (140 kg)    Physical Exam  General appearance - alert, well appearing, obese older African-American female and in no distress Mental status - normal mood, behavior, speech, dress, motor activity, and thought processes Chest - clear to auscultation, no wheezes, rales or rhonchi, symmetric air entry Heart - normal rate, regular rhythm, normal S1, S2, no murmurs, rubs, clicks or gallops Extremities - peripheral pulses normal, no pedal edema, no clubbing or cyanosis      Latest Ref Rng & Units 09/06/2022    3:59 PM 06/24/2022   10:15 AM 04/26/2021   11:38 AM  CMP  Glucose 70 - 99 mg/dL  112  101   BUN 8 - 23 mg/dL  23  24   Creatinine 0.44 - 1.00 mg/dL  0.82  0.72   Sodium 135 - 145 mmol/L  139  140   Potassium 3.5 - 5.1 mmol/L  4.2  4.4   Chloride 98 - 111 mmol/L  103  102   CO2 22 - 32 mmol/L  27  22   Calcium 8.9 - 10.3 mg/dL  9.3  9.2   Total Protein 6.0 - 8.5 g/dL 6.7   6.5   Total Bilirubin 0.0 - 1.2 mg/dL 0.6   0.6   Alkaline  Phos 44 - 121 IU/L 93   99   AST 0 - 40 IU/L 20   16   ALT 0 - 32 IU/L 17   16    Lipid Panel     Component Value Date/Time   CHOL 148 09/06/2022 1559   TRIG 97 09/06/2022 1559   HDL 72 09/06/2022 1559   CHOLHDL 2.1 09/06/2022 1559   CHOLHDL 2.0 11/10/2014 1148   VLDL 21 11/10/2014 1148   LDLCALC 58 09/06/2022 1559    CBC    Component Value Date/Time   WBC 7.5 06/24/2022 1015   RBC 4.63 06/24/2022 1015   HGB 11.8 (L) 06/24/2022 1015   HGB 12.4 10/23/2020 1621   HCT 36.7 06/24/2022 1015   HCT 37.8 10/23/2020 1621   PLT 363 06/24/2022 1015   PLT 418 10/23/2020 1621   MCV 79.3 (L) 06/24/2022 1015   MCV 80 10/23/2020 1621   MCH 25.5 (L) 06/24/2022 1015   MCHC 32.2 06/24/2022 1015   RDW 16.1 (H) 06/24/2022 1015   RDW 15.7 (H) 10/23/2020 1621   LYMPHSABS 2.5 06/24/2022 1015   LYMPHSABS 3.0 12/29/2018 1546   MONOABS 0.7 06/24/2022 1015   EOSABS 0.2 06/24/2022 1015   EOSABS 0.4 12/29/2018 1546   BASOSABS 0.1 06/24/2022 1015   BASOSABS 0.1 12/29/2018 1546  ASSESSMENT AND PLAN: 1. Type 2 diabetes mellitus with morbid obesity (HCC) A1c is at goal.  Continue low-dose metformin. Try to snack on healthy things like fruits.  Commended her on 20 pound weight loss.  It is unfortunate that her insurance does not cover for her to get back on a group 1 agent to help her achieve further weight loss - POCT glucose (manual entry) - POCT glycosylated hemoglobin (Hb A1C) - CBC - Basic Metabolic Panel  2. Hypertension associated with type 2 diabetes mellitus (HCC) Close to goal.  Continue amlodipine 5 mg daily, hydralazine 100 mg 3 times a day, Hyzaar 100/25 mg daily.  3. Constipation, unspecified constipation type Encouraged her to increase fiber in the diet in the form of green leafy vegetables.  She can continue to eat prunes several times a week if it helps in decreasing constipation.  Try to drink several glasses of water daily.  4. Screening for colon cancer Given the issues  she is having with constipation, I recommend doing colonoscopy but patient cannot afford her co-pay at this time.  She is agreeable to doing the fit test instead. - Fecal occult blood, imunochemical([facility])  5. Loud snoring 6. Other fatigue Possible sleep apnea.  Discussed doing a sleep study.  Patient wants to hold off for now until her financial situation is a little better.   Addendum 01/11/2023: Patient with mild microcytic anemia on blood test.  Iron studies will be added. Correction to physical exam above: Patient did have mild edema in the legs.  She was wearing compression socks.  Patient was given the opportunity to ask questions.  Patient verbalized understanding of the plan and was able to repeat key elements of the plan.   This documentation was completed using Paediatric nurse.  Any transcriptional errors are unintentional.  Orders Placed This Encounter  Procedures   Fecal occult blood, imunochemical([facility])   CBC   Basic Metabolic Panel   POCT glucose (manual entry)   POCT glycosylated hemoglobin (Hb A1C)     Requested Prescriptions    No prescriptions requested or ordered in this encounter    Return in about 4 months (around 05/11/2023).  Jonah Blue, MD, FACP

## 2023-01-11 ENCOUNTER — Telehealth: Payer: Self-pay | Admitting: Internal Medicine

## 2023-01-11 LAB — CBC
Hematocrit: 33.7 % — ABNORMAL LOW (ref 34.0–46.6)
Hemoglobin: 11.3 g/dL (ref 11.1–15.9)
MCH: 26.2 pg — ABNORMAL LOW (ref 26.6–33.0)
MCHC: 33.5 g/dL (ref 31.5–35.7)
MCV: 78 fL — ABNORMAL LOW (ref 79–97)
Platelets: 420 10*3/uL (ref 150–450)
RBC: 4.32 x10E6/uL (ref 3.77–5.28)
RDW: 15.5 % — ABNORMAL HIGH (ref 11.7–15.4)
WBC: 9 10*3/uL (ref 3.4–10.8)

## 2023-01-11 LAB — BASIC METABOLIC PANEL
BUN/Creatinine Ratio: 33 — ABNORMAL HIGH (ref 12–28)
BUN: 31 mg/dL — ABNORMAL HIGH (ref 8–27)
CO2: 26 mmol/L (ref 20–29)
Calcium: 9.4 mg/dL (ref 8.7–10.3)
Chloride: 100 mmol/L (ref 96–106)
Creatinine, Ser: 0.94 mg/dL (ref 0.57–1.00)
Glucose: 93 mg/dL (ref 70–99)
Potassium: 4.1 mmol/L (ref 3.5–5.2)
Sodium: 140 mmol/L (ref 134–144)
eGFR: 68 mL/min/{1.73_m2} (ref >59)

## 2023-01-11 NOTE — Addendum Note (Signed)
Addended by: Karle Plumber B on: 01/11/2023 05:04 PM   Modules accepted: Orders

## 2023-01-11 NOTE — Telephone Encounter (Signed)
Phone call placed to patient today to go over lab results.  I left a voicemail message informing her of who I am.  Advised that she has a mild anemia.  I will have the lab add on iron studies to see if this is iron deficiency.  If it is, I think she should seriously consider having a colonoscopy done to check for polyps or any abnormalities in the colon.  Advised that kidney function is okay.  I will get back to her once I have the results of the iron studies.  Advised to send me a message on MyChart if she has any questions.

## 2023-01-13 DIAGNOSIS — Z1211 Encounter for screening for malignant neoplasm of colon: Secondary | ICD-10-CM | POA: Diagnosis not present

## 2023-01-15 LAB — IRON,TIBC AND FERRITIN PANEL
Ferritin: 65 ng/mL (ref 15–150)
Iron Saturation: 16 % (ref 15–55)
Iron: 42 ug/dL (ref 27–139)
Total Iron Binding Capacity: 267 ug/dL (ref 250–450)
UIBC: 225 ug/dL (ref 118–369)

## 2023-01-15 LAB — FECAL OCCULT BLOOD, IMMUNOCHEMICAL: Fecal Occult Bld: NEGATIVE

## 2023-01-15 LAB — SPECIMEN STATUS REPORT

## 2023-01-17 ENCOUNTER — Other Ambulatory Visit: Payer: Self-pay

## 2023-01-21 ENCOUNTER — Other Ambulatory Visit: Payer: Self-pay

## 2023-01-21 ENCOUNTER — Other Ambulatory Visit: Payer: Self-pay | Admitting: Physician Assistant

## 2023-01-21 DIAGNOSIS — N3941 Urge incontinence: Secondary | ICD-10-CM

## 2023-01-21 MED ORDER — SOLIFENACIN SUCCINATE 10 MG PO TABS
10.0000 mg | ORAL_TABLET | Freq: Every day | ORAL | 1 refills | Status: DC
  Filled 2023-01-21: qty 30, 30d supply, fill #0
  Filled 2023-02-25: qty 30, 30d supply, fill #1
  Filled 2023-03-26: qty 30, 30d supply, fill #2
  Filled 2023-04-30 – 2023-05-07 (×2): qty 30, 30d supply, fill #3
  Filled 2023-06-05: qty 30, 30d supply, fill #4
  Filled 2023-07-02: qty 30, 30d supply, fill #5

## 2023-01-29 ENCOUNTER — Ambulatory Visit: Payer: 59 | Attending: Cardiology | Admitting: Pharmacist Clinician (PhC)/ Clinical Pharmacy Specialist

## 2023-01-29 VITALS — BP 136/81 | HR 92 | Ht 62.0 in | Wt 285.0 lb

## 2023-01-29 DIAGNOSIS — I10 Essential (primary) hypertension: Secondary | ICD-10-CM

## 2023-01-29 NOTE — Patient Instructions (Addendum)
Follow up appointment: MARCH 6 AT 3 PM WITH DR. Gardiner Rhyme  Take your BP meds as follows:  DECREASE THE HYDRALAZINE TO TWICE DAILY (BREAKFAST AND DINNER)  CONTINUE WITH ALL OTHER MEDICATIONS  Check your blood pressure at home daily (if able) and keep record of the readings.  Hypertension "High blood pressure"  Hypertension is often called "The Silent Killer." It rarely causes symptoms until it is extremely  high or has done damage to other organs in the body. For this reason, you should have your  blood pressure checked regularly by your physician. We will check your blood pressure  every time you see a provider at one of our offices.   Your blood pressure reading consists of two numbers. Ideally, blood pressure should be  below 120/80. The first ("top") number is called the systolic pressure. It measures the  pressure in your arteries as your heart beats. The second ("bottom") number is called the diastolic pressure. It measures the pressure in your arteries as the heart relaxes between beats.  The benefits of getting your blood pressure under control are enormous. A 10-point  reduction in systolic blood pressure can reduce your risk of stroke by 27% and heart failure by 28%  Your blood pressure goal is < 130/80  To check your pressure at home you will need to:  1. Sit up in a chair, with feet flat on the floor and back supported. Do not cross your ankles or legs. 2. Rest your left arm so that the cuff is about heart level. If the cuff goes on your upper arm,  then just relax the arm on the table, arm of the chair or your lap. If you have a wrist cuff, we  suggest relaxing your wrist against your chest (think of it as Pledging the Flag with the  wrong arm).  3. Place the cuff snugly around your arm, about 1 inch above the crook of your elbow. The  cords should be inside the groove of your elbow.  4. Sit quietly, with the cuff in place, for about 5 minutes. After that 5 minutes  press the power  button to start a reading. 5. Do not talk or move while the reading is taking place.  6. Record your readings on a sheet of paper. Although most cuffs have a memory, it is often  easier to see a pattern developing when the numbers are all in front of you.  7. You can repeat the reading after 1-3 minutes if it is recommended  Make sure your bladder is empty and you have not had caffeine or tobacco within the last 30 min  Always bring your blood pressure log with you to your appointments. If you have not brought your monitor in to be double checked for accuracy, please bring it to your next appointment.  You can find a list of quality blood pressure cuffs at [email or web address]

## 2023-01-30 ENCOUNTER — Telehealth (HOSPITAL_COMMUNITY): Payer: Self-pay | Admitting: Cardiology

## 2023-01-30 NOTE — Telephone Encounter (Signed)
Patient cancelled Cardiac PET CT for reason below:  12/27/2022 9:42 AM [id number], Linda Cooper  Cancel Rsn: Financial   Order will be removed from the active [place]. If patient calls back we will reinstate the orders.  Thank you

## 2023-02-06 ENCOUNTER — Encounter: Payer: Self-pay | Admitting: Pharmacist Clinician (PhC)/ Clinical Pharmacy Specialist

## 2023-02-06 NOTE — Progress Notes (Signed)
Office Visit    Patient Name: Linda Cooper Date of Encounter: 02/06/2023  Primary Care Provider:  Ladell Pier, MD Primary Cardiologist:  None  Chief Complaint    Hypertension  Significant Past Medical History   DM2 8/23 A1c 6.5, on metformin  asthma Prn albuterol  arthritis Uses naproxen daily  Vit D deficiency On 50,000 u/week    Allergies  Allergen Reactions   Ace Inhibitors Cough   Erythromycin Base     Cramping in abdomen is reaction/no rash    History of Present Illness    S: HPI: Linda Cooper is a 89 YOF with hx of HTN, asthma, anemia, GERD, T2DM, morbid obesity, OA (knees), and constipation presenting today for a HTN management follow-up visit after seeing Dr. Gardiner Rhyme on 11/27/2022.  Pt's BP during that OV was 144/82 mmHg.  Pt is currently taking hydralazine 100 mg TID, losartan-HCTZ 100-25 daily, and amlodipine 5 mg daily.  Pt saw Dr. Wynetta Emery on 01/10/2023.  BP during that visit was 130/83 (almost at goal) mmHg.       Pt was instructed to check BP at home and bring log and home BP monitor to calibrate to visit today.  Pt brought log to visit today but did not bring monitor to calibrate.  Pt reports that she checks BP at home using a wrist cuff.  She normally checks it at lunch/dinner time.  Pt reported that if her systolic number is in the 100s then she will skip her nightly hydralazine dose.  She will also forget to take her hydralazine nightly dose ~3X per week.  She reports being compliant with the amlodipine even though she experienced swelling while on it a long time ago.  She also reports being compliant with losartan-HCTZ.  Average home BP: 131/76 (35 readings over 20 days).  Pt did report lightheadedness this morning but stated that this hasn't occurred any other time.  Pt denies other symptoms including headache and dizziness.  ROS: As per HPI, otherwise non-contributory  FH: Mother (diabetes, heart disease, HTN, obesity, stroke (28s)), Father (diabetes,  heart disease, HLD, HTN, obesity, stroke (multiple in 90s)), Brother (diabetes, gout, HTN), Brother (obesity, CAD), Brother (liver failure), Sister (heart disease), Sister (high blood pressure), Son (obesity, high blood pressure)  SH: Occupation: Naval architect.  Single.  Lives at home with son.  Never smoker, no vape use, no current alcohol use.  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  Diet: breakfast: boiled egg, Lynnae Sandhoff light (a slice or two), bowl of grits on the weekends; snacks: apples, baked Lays, green bean crisps; lunch: soup, roasted/ground [place], chicken, pasta; dinner: soup, roasted/ground [place], chicken, eggs; eating out for breakfast ([place] and [employer]) a lot last month, not as much this month  O: PMH: HTN, asthma, anemia, GERD, T2DM, morbid obesity, osteoarthritis, constipation  Meds: HTN: hydralazine 100 mg TID, losartan-HCTZ 100-25 mg daily, amlodipine 5 mg daily, atorvastatin 10 mg daily, albuterol 2 puffs into the lungs q4h PRN, metformin 500 mg daily with breakfast, multivitamin, naproxen 500 mg BID, solifenacin 10 mg daily, vitamin D 1.25 1 capsule (50,000 units) q7days - NOT TAKING  ALL: ACEis (cough), erythromycin base (cramping)  VS: BP 142/81 (FIRST), 136/81 (SECOND), P 92, Ht 5'2, Wt 285 lb., BMI 52.1  Accessory Clinical Findings    Lab Results  Component Value Date   CREATININE 0.94 01/10/2023   BUN 31 (H) 01/10/2023   NA 140 01/10/2023   K 4.1  01/10/2023   CL 100 01/10/2023   CO2 26 01/10/2023   Lab Results  Component Value Date   ALT 17 09/06/2022   AST 20 09/06/2022   ALKPHOS 93 09/06/2022   BILITOT 0.6 09/06/2022   Lab Results  Component Value Date   HGBA1C 5.9 01/10/2023    Home Medications    Current Outpatient Medications  Medication Sig Dispense Refill   albuterol (VENTOLIN HFA) 108 (90 Base) MCG/ACT inhaler Inhale 2 puffs into the lungs every 4 (four) hours as needed for wheezing. 6.7 g 1    amLODipine (NORVASC) 5 MG tablet Take 1 tablet (5 mg total) by mouth daily. 90 tablet 3   atorvastatin (LIPITOR) 10 MG tablet Take 1 tablet (10 mg total) by mouth daily. 90 tablet 3   Blood Glucose Monitoring Suppl (CONTOUR NEXT MONITOR) w/Device KIT 1 Units by Does not apply route daily. 1 kit 0   glucose blood (CONTOUR NEXT TEST) test strip USE AS INSTRUCTED ONCE DAILY 100 strip 1   hydrALAZINE (APRESOLINE) 100 MG tablet Take 1 tablet (100 mg total) by mouth 3 (three) times daily. 90 tablet 5   losartan-hydrochlorothiazide (HYZAAR) 100-25 MG tablet TAKE 1 TABLET BY MOUTH DAILY. 90 tablet 3   metFORMIN (GLUCOPHAGE-XR) 500 MG 24 hr tablet Take 1 tablet (500 mg total) by mouth daily with breakfast. 90 tablet 1   MICROLET LANCETS MISC Use as directed once daily 100 each 12   Multiple Vitamins-Minerals (MULTIVITAMIN WITH MINERALS) tablet Take 1 tablet by mouth daily.     naproxen (NAPROSYN) 500 MG tablet Take 1 tablet (500 mg total) by mouth 2 (two) times daily. 60 tablet 0   NON FORMULARY SEGA PRO BLADDER HEALTH 100 MG DAILY     solifenacin (VESICARE) 10 MG tablet Take 1 tablet (10 mg total) by mouth daily. 90 tablet 1   Vitamin D, Ergocalciferol, (DRISDOL) 1.25 MG (50000 UT) CAPS capsule Take 1 capsule (50,000 Units total) by mouth every 7 (seven) days. 4 capsule 0   No current facility-administered medications for this visit.     Assessment & Plan    Elevated blood pressure reading in office with diagnosis of hypertension A:  HTN: Goal <130/80.  As patient's average home BP is 131/76 and office visit BP on recheck was 136/81, will decrease her hydralazine from 100 mg TID to 100 mg BID.  Pt was only taking hydralazine 100 mg PO BID as stated above.  Pt wanted to weigh herself after the visit today.  She has lost 4 pounds.  Commended pt on weight loss.  She seemed motivated to keep doing what she is doing with her diet.   P:  HTN: Decrease hydralazine 100 mg PO TID to 100 mg BID.  Pt was only  taking hydralazine 100 mg BID prior to visit.  Continue amlodipine and losartan-HCTZ as written.  Pt was receptive. Follow-up/Referral:  Donney Rankins PharmD candidate [facility] class of 2024  I was with student and patient for entire appointment and agree with above assessment and plan  Tommy Medal PharmD [facility]  [address] [place], [place] [address] [phone]

## 2023-02-06 NOTE — Assessment & Plan Note (Signed)
A:  HTN: Goal <130/80.  As patient's average home BP is 131/76 and office visit BP on recheck was 136/81, will decrease her hydralazine from 100 mg TID to 100 mg BID.  Pt was only taking hydralazine 100 mg PO BID as stated above.  Pt wanted to weigh herself after the visit today.  She has lost 4 pounds.  Commended pt on weight loss.  She seemed motivated to keep doing what she is doing with her diet.   P:  HTN: Decrease hydralazine 100 mg PO TID to 100 mg BID.  Pt was only taking hydralazine 100 mg BID prior to visit.  Continue amlodipine and losartan-HCTZ as written.  Pt was receptive. Follow-up/Referral:

## 2023-02-12 ENCOUNTER — Ambulatory Visit: Payer: 59 | Attending: Cardiology | Admitting: Cardiology

## 2023-02-12 ENCOUNTER — Other Ambulatory Visit (HOSPITAL_COMMUNITY): Payer: Self-pay

## 2023-02-12 ENCOUNTER — Encounter: Payer: Self-pay | Admitting: Cardiology

## 2023-02-12 ENCOUNTER — Other Ambulatory Visit: Payer: Self-pay

## 2023-02-12 VITALS — BP 135/69 | HR 97 | Ht 62.0 in | Wt 289.4 lb

## 2023-02-12 DIAGNOSIS — I10 Essential (primary) hypertension: Secondary | ICD-10-CM | POA: Diagnosis not present

## 2023-02-12 DIAGNOSIS — R0683 Snoring: Secondary | ICD-10-CM

## 2023-02-12 DIAGNOSIS — R0609 Other forms of dyspnea: Secondary | ICD-10-CM

## 2023-02-12 NOTE — Progress Notes (Signed)
Cardiology Office Note:    Date:  02/12/2023   ID:  Linda, Cooper 19-Feb-1961, MRN [id number]  PCP:  Ladell Pier, MD  Cardiologist:  None  Electrophysiologist:  None   Referring MD: Ladell Pier, MD   Chief Complaint  Patient presents with   Shortness of Breath    History of Present Illness:    Linda Cooper is a 62 y.o. female with a hx of asthma, diabetes, hypertension who presents for follow-up.  She was referred by Dr. Wynetta Emery for evaluation of dyspnea.  She reports she has been having progressively worsening dyspnea over the last months.  Reports short of breath with minimal exertion.  She denies any chest pain.  Also having lightheadedness but denies any syncope.  No palpitations.  Does report she has lower extremity edema.  She has not been exercising.  No smoking history.  Mother had CVA in 60s, father had multiple CVAs with first in his 66s.  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  Echocardiogram 11/06/2022 showed EF 50 to [id number], grade 2 diastolic dysfunction, normal RV function, no significant valvular disease.  At initial clinic visit, cardiac PET was ordered but has not been done, states that could not afford.  Continues to have dyspnea.  Denies any chest pain.  Does report some lower extremity edema.  Denies any palpitations.  Reports some lightheadedness but denies any syncope.   Past Medical History:  Diagnosis Date   'light-for-dates' infant with signs of fetal malnutrition    Anemia    Arthritis    Asthma    Diabetes mellitus without complication ([place]) Dx [id number]   GERD (gastroesophageal reflux disease)    Hypertension Dx 1990   Knee pain, bilateral    Obesity    Rheumatic fever childhood    has intermittent murmur and hand arthritis     Past Surgical History:  Procedure Laterality Date   ABDOMINAL HYSTERECTOMY  2006    HERNIA REPAIR  [id number]    Umbilical     Current Medications: Current Meds  Medication Sig   albuterol (VENTOLIN  HFA) 108 (90 Base) MCG/ACT inhaler Inhale 2 puffs into the lungs every 4 (four) hours as needed for wheezing.   amLODipine (NORVASC) 5 MG tablet Take 1 tablet (5 mg total) by mouth daily.   atorvastatin (LIPITOR) 10 MG tablet Take 1 tablet (10 mg total) by mouth daily.   Blood Glucose Monitoring Suppl (CONTOUR NEXT MONITOR) w/Device KIT 1 Units by Does not apply route daily.   glucose blood (CONTOUR NEXT TEST) test strip USE AS INSTRUCTED ONCE DAILY   hydrALAZINE (APRESOLINE) 100 MG tablet Take 100 mg by mouth 2 (two) times daily.   losartan-hydrochlorothiazide (HYZAAR) 100-25 MG tablet TAKE 1 TABLET BY MOUTH DAILY.   metFORMIN (GLUCOPHAGE-XR) 500 MG 24 hr tablet Take 1 tablet (500 mg total) by mouth daily with breakfast.   MICROLET LANCETS MISC Use as directed once daily   Multiple Vitamins-Minerals (MULTIVITAMIN WITH MINERALS) tablet Take 1 tablet by mouth daily.   naproxen (NAPROSYN) 500 MG tablet Take 1 tablet (500 mg total) by mouth 2 (two) times daily.   NON FORMULARY SEGA PRO BLADDER HEALTH 100 MG DAILY   solifenacin (VESICARE) 10 MG tablet Take 1 tablet (10 mg total) by mouth daily.   Vitamin D, Ergocalciferol, (DRISDOL) 1.25 MG (50000 UT) CAPS capsule Take 1 capsule (50,000 Units total) by mouth every 7 (seven) days.     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    Son is 20.       She is motivated  to lose weight and improve health by the expected death of her father  27-Dec-2016) and the sudden deaths of her cousin (found dead in car one month after death of his wife 02/24/2017)  and his wife (died after childbirth 01/27/17)    Exercise: minimal    Social Determinants of Health   Financial Resource Strain: Not on file  Food Insecurity: Not on file  Transportation Needs: Not on file  Physical Activity: Not on file  Stress: Not on file  Social Connections: Not on file     Family History: The patient's family history includes Breast cancer in her cousin; Diabetes in her brother, father, and mother; Gout in her brother; Heart disease in her father, mother, and sister; Hyperlipidemia in her father; Hypertension in her brother, father, and mother; Obesity in her father and mother; Stroke in her father and mother. There is no history of Cancer.  ROS:   Please see the history of present illness.     All other systems reviewed and are negative.  EKGs/Labs/Other Studies Reviewed:    The following studies were reviewed today:   EKG:   11/27/2022: Normal sinus rhythm, rate 84, low voltage, nonspecific T wave flattening  Recent Labs: 06/24/2022: B Natriuretic Peptide 19.0; TSH 1.422 09/06/2022: ALT 17 01/10/2023: BUN 31; Creatinine, Ser 0.94; Hemoglobin 11.3; Platelets 420; Potassium 4.1; Sodium 140  Recent Lipid Panel    Component Value Date/Time   CHOL 148 09/06/2022 1559   TRIG 97 09/06/2022 1559   HDL 72 09/06/2022 1559   CHOLHDL 2.1 09/06/2022 1559   CHOLHDL 2.0 11/10/2014 1148   VLDL 21 11/10/2014 1148   LDLCALC 58 09/06/2022 1559    Physical Exam:    VS:  BP 135/69 (BP Location: Left Arm, Patient Position: Sitting, Cuff Size: Normal)   Pulse 97   Ht '5\' 2"'$  (1.575 m)   Wt 289 lb 6.4 oz (131.3 kg)   SpO2 97%   BMI 52.93 kg/m     Wt Readings from Last 3 Encounters:  02/12/23 289 lb 6.4 oz (131.3 kg)  01/29/23 285 lb (129.3 kg)  01/10/23 289 lb (131.1 kg)     GEN:   in no  acute distress HEENT: Normal NECK: No JVD; No carotid bruits LYMPHATICS: No lymphadenopathy CARDIAC: RRR, no murmurs, rubs, gallops RESPIRATORY:  Clear to auscultation without rales, wheezing or rhonchi  ABDOMEN: Soft, non-tender, non-distended MUSCULOSKELETAL:  No edema; No deformity  SKIN: Warm and dry NEUROLOGIC:  Alert and oriented x 3 PSYCHIATRIC:  Normal affect   ASSESSMENT:    1. DOE (dyspnea on exertion)   2. Essential hypertension   3. Morbid obesity ([place])  4. Snoring     PLAN:    Dyspnea: reports progressive dyspnea on exertion.  Could represent anginal equivalent, as multiple CAD risk factors (age, hypertension, T2DM).  Echocardiogram 11/06/2022 showed EF 50 to [id number], grade 2 diastolic dysfunction, normal RV function, no significant valvular disease. -Not a good candidate for coronary CTA given BMI, recommended stress PET to evaluate for ischemia.  Reports stress test was too expensive so did not obtain.  Discussed importance of focusing on weight loss.  If continues to have shortness of breath after weight loss, can revisit ischemic testing  Hypertension: On hydralazine 100 mg 2 times daily, losartan-HCTZ 100-25 mg daily, amlodipine 5 mg daily.  Appears controlled  T2DM: On metformin and Rybelsus.  A1c 6.4% on 09/06/2022.  Recommend starting statin given history of diabetes, LDL is 58 on 09/06/2022, started atorvastatin 10 mg daily  Hyperlipidemia: On atorvastatin 10 mg daily.  LDL 58 on 09/06/2022  Snoring: Check sleep study.  [place] 4  Morbid obesity: Body mass index is 52.93 kg/m.  Diet/exercise recommended.  She previously tried healthy weight and wellness but not covered by insurance   RTC in 6 months   Medication Adjustments/Labs and Tests Ordered: Current medicines are reviewed at length with the patient today.  Concerns regarding medicines are outlined above.  No orders of the defined types were placed in this encounter.  No orders of the defined types  were placed in this encounter.   Patient Instructions  Medication Instructions:  Your physician recommends that you continue on your current medications as directed. Please refer to the Current Medication list given to you today.  *If you need a refill on your cardiac medications before your next appointment, please call your pharmacy*  Follow-Up: At [facility], you and your health needs are our priority.  As part of our continuing mission to provide you with exceptional heart care, we have created designated Provider Care Teams.  These Care Teams include your primary Cardiologist (physician) and Advanced Practice Providers (APPs -  Physician Assistants and Nurse Practitioners) who all work together to provide you with the care you need, when you need it.  We recommend signing up for the patient portal called "MyChart".  Sign up information is provided on this After Visit Summary.  MyChart is used to connect with patients for Virtual Visits (Telemedicine).  Patients are able to view lab/test results, encounter notes, upcoming appointments, etc.  Non-urgent messages can be sent to your provider as well.   To learn more about what you can do with MyChart, go to [email or web address].    Your next appointment:   6 month(s)  Provider:   Dr. Gardiner Rhyme    Signed, Donato Heinz, MD  02/12/2023 3:53 PM    [place]

## 2023-02-12 NOTE — Patient Instructions (Signed)
Medication Instructions:  Your physician recommends that you continue on your current medications as directed. Please refer to the Current Medication list given to you today.  *If you need a refill on your cardiac medications before your next appointment, please call your pharmacy*  Follow-Up: At [facility], you and your health needs are our priority.  As part of our continuing mission to provide you with exceptional heart care, we have created designated Provider Care Teams.  These Care Teams include your primary Cardiologist (physician) and Advanced Practice Providers (APPs -  Physician Assistants and Nurse Practitioners) who all work together to provide you with the care you need, when you need it.  We recommend signing up for the patient portal called "MyChart".  Sign up information is provided on this After Visit Summary.  MyChart is used to connect with patients for Virtual Visits (Telemedicine).  Patients are able to view lab/test results, encounter notes, upcoming appointments, etc.  Non-urgent messages can be sent to your provider as well.   To learn more about what you can do with MyChart, go to [email or web address].    Your next appointment:   6 month(s)  Provider:   Dr. Gardiner Rhyme

## 2023-02-18 ENCOUNTER — Other Ambulatory Visit: Payer: Self-pay

## 2023-02-25 ENCOUNTER — Other Ambulatory Visit: Payer: Self-pay

## 2023-02-26 ENCOUNTER — Other Ambulatory Visit: Payer: Self-pay

## 2023-03-04 ENCOUNTER — Telehealth: Payer: Self-pay

## 2023-03-04 NOTE — Telephone Encounter (Signed)
LVM for patient to return my call concerning her Itamar Sleep device.

## 2023-03-05 ENCOUNTER — Encounter (HOSPITAL_COMMUNITY): Payer: Self-pay | Admitting: Emergency Medicine

## 2023-03-05 ENCOUNTER — Other Ambulatory Visit: Payer: Self-pay

## 2023-03-05 ENCOUNTER — Ambulatory Visit (HOSPITAL_COMMUNITY)
Admission: EM | Admit: 2023-03-05 | Discharge: 2023-03-05 | Disposition: A | Discharge status: Home / Self Care | Payer: 59 | Attending: Family Medicine | Admitting: Family Medicine

## 2023-03-05 DIAGNOSIS — J4521 Mild intermittent asthma with (acute) exacerbation: Secondary | ICD-10-CM

## 2023-03-05 DIAGNOSIS — J069 Acute upper respiratory infection, unspecified: Secondary | ICD-10-CM | POA: Diagnosis not present

## 2023-03-05 MED ORDER — PREDNISONE 20 MG PO TABS
40.0000 mg | ORAL_TABLET | Freq: Every day | ORAL | 0 refills | Status: DC
  Filled 2023-03-05: qty 10, 5d supply, fill #0

## 2023-03-05 MED ORDER — METHYLPREDNISOLONE SODIUM SUCC 125 MG IJ SOLR
80.0000 mg | Freq: Once | INTRAMUSCULAR | Status: AC
  Administered 2023-03-05: 80 mg via INTRAMUSCULAR

## 2023-03-05 MED ORDER — METHYLPREDNISOLONE SODIUM SUCC 125 MG IJ SOLR
INTRAMUSCULAR | Status: AC
  Filled 2023-03-05: qty 2

## 2023-03-05 MED ORDER — PROMETHAZINE-DM 6.25-15 MG/5ML PO SYRP
5.0000 mL | ORAL_SOLUTION | Freq: Four times a day (QID) | ORAL | 0 refills | Status: DC | PRN
  Filled 2023-03-05: qty 118, 6d supply, fill #0

## 2023-03-05 NOTE — Discharge Instructions (Signed)
Meds ordered this encounter  Medications   predniSONE (DELTASONE) 20 MG tablet    Sig: Take 2 tablets (40 mg total) by mouth daily.    Dispense:  10 tablet    Refill:  0   promethazine-dextromethorphan (PROMETHAZINE-DM) 6.25-15 MG/5ML syrup    Sig: Take 5 mLs by mouth 4 (four) times daily as needed for cough.    Dispense:  118 mL    Refill:  0   methylPREDNISolone sodium succinate (SOLU-MEDROL) 125 mg/2 mL injection 80 mg

## 2023-03-05 NOTE — ED Provider Notes (Signed)
[place]   [id number] 03/05/23 Arrival Time: [id number]  ASSESSMENT & PLAN:  1. Viral URI with cough   2. Mild intermittent asthma with acute exacerbation    Discussed typical duration of likely viral illness. OTC symptom care as needed.  New Prescriptions   PREDNISONE (DELTASONE) 20 MG TABLET    Take 2 tablets (40 mg total) by mouth daily.   PROMETHAZINE-DEXTROMETHORPHAN (PROMETHAZINE-DM) 6.25-15 MG/5ML SYRUP    Take 5 mLs by mouth 4 (four) times daily as needed for cough.   Cannot pick up PO meds until tomorrow am.  Medications Given   methylPREDNISolone sodium succinate (SOLU-MEDROL) 125 mg/2 mL injection 80 mg     Follow-up Information     Ladell Pier, MD.   Specialty: Internal Medicine Why: As needed. Contact information: [address] [place] [address] [phone]         [facility] [place].   Specialty: Urgent Care Why: If worsening or failing to improve as anticipated. Contact information: [place] [id number] [phone]                Reviewed expectations re: course of current medical issues. Questions answered. Outlined signs and symptoms indicating need for more acute intervention. Understanding verbalized. After Visit Summary given.   SUBJECTIVE: History from: Patient. Linda Cooper is a 62 y.o. female. Patient complains of cough, wheezing, stuffy nose for 4 days.   Patient took coricidin hbp, cough drops without much relief. Denies: fever/SOB/CP. Normal PO intake without n/v/d.  OBJECTIVE:  Vitals:   03/05/23 1742  BP: (!) 162/82  Pulse: 85  Resp: (!) 22  Temp: 98.2 F (36.8 C)  TempSrc: Oral  SpO2: 96%    Recheck RR: 18  General appearance: alert; no distress Eyes: PERRLA; EOMI; conjunctiva normal HENT: [place]; AT; with nasal congestion Neck: supple  Lungs: speaks full sentences without difficulty; unlabored; mild bilateral exp  wheezing Extremities: no edema Skin: warm and dry Neurologic: normal gait Psychological: alert and cooperative; normal mood and affect  Labs: Results for orders placed or performed in visit on 01/10/23  Fecal occult blood, imunochemical([facility])   Specimen: Stool   ST  Result Value Ref Range   Fecal Occult Bld Negative Negative  CBC  Result Value Ref Range   WBC 9.0 3.4 - 10.8 x10E3/uL   RBC 4.32 3.77 - 5.28 x10E6/uL   Hemoglobin 11.3 11.1 - 15.9 g/dL   Hematocrit 33.7 (L) 34.0 - 46.6 %   MCV 78 (L) 79 - 97 fL   MCH 26.2 (L) 26.6 - 33.0 pg   MCHC 33.5 31.5 - 35.7 g/dL   RDW 15.5 (H) 11.7 - 15.4 %   Platelets 420 150 - 450 [id number]  Basic Metabolic Panel  Result Value Ref Range   Glucose 93 70 - 99 mg/dL   BUN 31 (H) 8 - 27 mg/dL   Creatinine, Ser 0.94 0.57 - 1.00 mg/dL   eGFR 68 >59 mL/min/1.73   BUN/Creatinine Ratio 33 (H) 12 - 28   Sodium 140 134 - 144 mmol/L   Potassium 4.1 3.5 - 5.2 mmol/L   Chloride 100 96 - 106 mmol/L   CO2 26 20 - 29 mmol/L   Calcium 9.4 8.7 - 10.3 mg/dL  Iron, TIBC and Ferritin Panel  Result Value Ref Range   Total Iron Binding Capacity 267 250 - 450 ug/dL   UIBC 225 118 - 369 ug/dL   Iron 42  27 - 139 ug/dL   Iron Saturation 16 15 - 55 %   Ferritin 65 15 - 150 ng/mL  Specimen status report  Result Value Ref Range   specimen status report Comment   POCT glucose (manual entry)  Result Value Ref Range   POC Glucose 111 (A) 70 - 99 mg/dl  POCT glycosylated hemoglobin (Hb A1C)  Result Value Ref Range   Hemoglobin A1C     HbA1c POC (<> result, manual entry)     HbA1c, POC (prediabetic range)     HbA1c, POC (controlled diabetic range) 5.9 0.0 - 7.0 %   Labs Reviewed - No data to display  Imaging: No results found.  Allergies  Allergen Reactions   Ace Inhibitors Cough   Erythromycin Base     Cramping in abdomen is reaction/no rash    Past Medical History:  Diagnosis Date   'light-for-dates' infant with signs of fetal  malnutrition    Anemia    Arthritis    Asthma    Diabetes mellitus without complication ([place]) Dx [id number]   GERD (gastroesophageal reflux disease)    Hypertension Dx 1990   Knee pain, bilateral    Obesity    Rheumatic fever childhood    has intermittent murmur and hand arthritis    Social History   Socioeconomic History   Marital status: Single    Spouse name: Not on file   Number of children: 1    Years of education: college    Highest education level: Not on file  Occupational History   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    Son is 70.       She is motivated to lose weight and improve health by the expected death of her father  January 09, 2017) and the sudden deaths of her cousin (found dead in car one month after death of his wife Mar 09, 2017)  and his wife (died after childbirth 02/06/17)    Exercise: minimal    Social Determinants of Health   Financial Resource Strain: Not on file  Food Insecurity: Not on file  Transportation Needs: Not on file  Physical Activity: Not on file  Stress: Not on file  Social Connections: Not on file  Intimate Partner Violence: Not on file   Family History  Problem Relation Age of Onset   Hypertension Mother    Diabetes Mother    Stroke Mother    Heart disease Mother    Obesity Mother    Hypertension Father    Diabetes Father    Stroke Father    Obesity Father    Heart disease Father    Hyperlipidemia Father    Heart disease Sister    Diabetes Brother    Hypertension Brother    Gout Brother    Breast cancer Cousin    Cancer Neg Hx    Past Surgical History:  Procedure Laterality Date   ABDOMINAL HYSTERECTOMY  03/09/2005     HERNIA REPAIR  [id number]    Umbilical      Vanessa Kick, MD 03/05/23 1820

## 2023-03-05 NOTE — ED Triage Notes (Signed)
Patient complains of cough, wheezing, stuffy nose for 4 days.    Patient took coricidin hbp, cough drops

## 2023-03-06 ENCOUNTER — Other Ambulatory Visit: Payer: Self-pay

## 2023-03-11 ENCOUNTER — Other Ambulatory Visit: Payer: Self-pay | Admitting: Internal Medicine

## 2023-03-11 ENCOUNTER — Telehealth: Payer: Self-pay | Admitting: Internal Medicine

## 2023-03-11 ENCOUNTER — Other Ambulatory Visit: Payer: Self-pay

## 2023-03-11 ENCOUNTER — Other Ambulatory Visit: Payer: Self-pay | Admitting: *Deleted

## 2023-03-11 DIAGNOSIS — E1159 Type 2 diabetes mellitus with other circulatory complications: Secondary | ICD-10-CM

## 2023-03-11 MED ORDER — HYDRALAZINE HCL 100 MG PO TABS
100.0000 mg | ORAL_TABLET | Freq: Two times a day (BID) | ORAL | 3 refills | Status: DC
  Filled 2023-03-11: qty 60, 30d supply, fill #0
  Filled 2023-04-14: qty 60, 30d supply, fill #1
  Filled 2023-05-21: qty 60, 30d supply, fill #2
  Filled 2023-07-02: qty 60, 30d supply, fill #3
  Filled 2023-08-05: qty 60, 30d supply, fill #4
  Filled 2023-09-04: qty 60, 30d supply, fill #5
  Filled 2023-10-14: qty 60, 30d supply, fill #6
  Filled 2023-11-17 (×2): qty 60, 30d supply, fill #7
  Filled 2023-12-23: qty 60, 30d supply, fill #8
  Filled 2024-01-29: qty 60, 30d supply, fill #9

## 2023-03-11 NOTE — Telephone Encounter (Signed)
Copied from [place] [id number]. Topic: General - Other >> Mar 11, 2023  2:22 PM Linda Cooper wrote: Reason for CRM: The patient has called to follow up with a member of clinical staff on their previous request for hydrALAZINE (APRESOLINE) 100 MG tablet [id number]  Please contact the patient further when possible

## 2023-03-11 NOTE — Telephone Encounter (Signed)
Unable to refill per protocol, Rx expired. Discontinued 02/12/23.  Requested Prescriptions  Pending Prescriptions Disp Refills   hydrALAZINE (APRESOLINE) 100 MG tablet 90 tablet 5    Sig: Take 1 tablet (100 mg total) by mouth 3 (three) times daily.     Cardiovascular:  Vasodilators Failed - 03/11/2023  2:14 PM      Failed - HCT in normal range and within 360 days    Hematocrit  Date Value Ref Range Status  01/10/2023 33.7 (L) 34.0 - 46.6 % Final         Failed - ANA Screen, Ifa, Serum in normal range and within 360 days    No results found for: "ANA", "ANATITER", "LABANTI"       Failed - Last BP in normal range    BP Readings from Last 1 Encounters:  03/05/23 (!) 162/82         Passed - HGB in normal range and within 360 days    Hemoglobin  Date Value Ref Range Status  01/10/2023 11.3 11.1 - 15.9 g/dL Final         Passed - RBC in normal range and within 360 days    RBC  Date Value Ref Range Status  01/10/2023 4.32 3.77 - 5.28 x10E6/uL Final  06/24/2022 4.63 3.87 - 5.11 MIL/uL Final         Passed - WBC in normal range and within 360 days    WBC  Date Value Ref Range Status  01/10/2023 9.0 3.4 - 10.8 x10E3/uL Final  06/24/2022 7.5 4.0 - 10.5 K/uL Final         Passed - PLT in normal range and within 360 days    Platelets  Date Value Ref Range Status  01/10/2023 420 150 - 450 x10E3/uL Final         Passed - Valid encounter within last 12 months    Recent Outpatient Visits           2 months ago Type 2 diabetes mellitus with morbid obesity ([place])   [place] Karle Plumber B, MD   6 months ago Type 2 diabetes mellitus with morbid obesity [facility])   [place], [place] L, RPH-CPP   6 months ago Type 2 diabetes mellitus with morbid obesity [facility])   [place], MD   7 months ago Urge incontinence   [place], [place], [place]   1 year ago Type 2 diabetes mellitus with morbid obesity [facility])   [place] Ladell Pier, MD       Future Appointments             In 2 months Wynetta Emery, Dalbert Batman, MD [place]   In 5 months Donato Heinz, MD [place] at [facility]

## 2023-03-12 ENCOUNTER — Other Ambulatory Visit: Payer: Self-pay

## 2023-03-12 NOTE — Telephone Encounter (Signed)
Called & spoke to the patient. Verified name & DOB. Patient stated that she has already picked up medication.  Patient stated that she has concerns about her bronchitis that she was recently diagnosed with at [facility], her cough has not gotten any better with medications. Patient stated that she usually get an prednisone and an antibiotic for treatment. Every time she lays down she is coughing & wheezing. Please advise.

## 2023-03-13 NOTE — Telephone Encounter (Signed)
Called & spoke to the patient and advised to got to the mobile bus due to no immediate availability in clinic. Advised that if symptoms worsened to head to UC or ER. Patient expressed understanding of all discussed. No further questions at this time.

## 2023-03-21 ENCOUNTER — Other Ambulatory Visit: Payer: Self-pay

## 2023-04-01 ENCOUNTER — Other Ambulatory Visit: Payer: Self-pay

## 2023-04-14 ENCOUNTER — Other Ambulatory Visit: Payer: Self-pay

## 2023-04-17 ENCOUNTER — Other Ambulatory Visit: Payer: Self-pay

## 2023-04-24 ENCOUNTER — Other Ambulatory Visit: Payer: Self-pay

## 2023-04-30 ENCOUNTER — Other Ambulatory Visit: Payer: Self-pay

## 2023-05-07 ENCOUNTER — Other Ambulatory Visit: Payer: Self-pay

## 2023-05-12 ENCOUNTER — Telehealth: Payer: Self-pay

## 2023-05-12 NOTE — Telephone Encounter (Signed)
The patient has returned the Boyd device on 05/07/23

## 2023-05-13 ENCOUNTER — Ambulatory Visit: Payer: 59 | Admitting: Internal Medicine

## 2023-05-15 ENCOUNTER — Other Ambulatory Visit: Payer: Self-pay

## 2023-05-16 ENCOUNTER — Other Ambulatory Visit: Payer: Self-pay

## 2023-05-21 ENCOUNTER — Other Ambulatory Visit: Payer: Self-pay

## 2023-06-05 ENCOUNTER — Other Ambulatory Visit: Payer: Self-pay

## 2023-06-06 ENCOUNTER — Other Ambulatory Visit: Payer: Self-pay

## 2023-06-16 ENCOUNTER — Other Ambulatory Visit: Payer: Self-pay

## 2023-06-16 ENCOUNTER — Other Ambulatory Visit: Payer: Self-pay | Admitting: Physician Assistant

## 2023-06-16 DIAGNOSIS — E1169 Type 2 diabetes mellitus with other specified complication: Secondary | ICD-10-CM

## 2023-06-16 MED ORDER — METFORMIN HCL ER 500 MG PO TB24
500.0000 mg | ORAL_TABLET | Freq: Every day | ORAL | 0 refills | Status: DC
  Filled 2023-06-16: qty 90, 90d supply, fill #0

## 2023-06-18 ENCOUNTER — Ambulatory Visit (HOSPITAL_COMMUNITY)
Admission: EM | Admit: 2023-06-18 | Discharge: 2023-06-18 | Disposition: A | Discharge status: Home / Self Care | Payer: Self-pay | Attending: Family Medicine | Admitting: Family Medicine

## 2023-06-18 ENCOUNTER — Encounter (HOSPITAL_COMMUNITY): Payer: Self-pay

## 2023-06-18 ENCOUNTER — Other Ambulatory Visit: Payer: Self-pay

## 2023-06-18 DIAGNOSIS — M25561 Pain in right knee: Secondary | ICD-10-CM

## 2023-06-18 DIAGNOSIS — I10 Essential (primary) hypertension: Secondary | ICD-10-CM

## 2023-06-18 MED ORDER — ACETAMINOPHEN 325 MG PO TABS
ORAL_TABLET | ORAL | Status: AC
  Filled 2023-06-18: qty 3

## 2023-06-18 MED ORDER — DEXAMETHASONE SODIUM PHOSPHATE 10 MG/ML IJ SOLN
INTRAMUSCULAR | Status: AC
  Filled 2023-06-18: qty 1

## 2023-06-18 MED ORDER — KETOROLAC TROMETHAMINE 30 MG/ML IJ SOLN
15.0000 mg | Freq: Once | INTRAMUSCULAR | Status: AC
  Administered 2023-06-18: 15 mg via INTRAMUSCULAR

## 2023-06-18 MED ORDER — HYDROCODONE-ACETAMINOPHEN 5-325 MG PO TABS
0.5000 | ORAL_TABLET | Freq: Four times a day (QID) | ORAL | 0 refills | Status: DC | PRN
  Filled 2023-06-18: qty 4, 1d supply, fill #0

## 2023-06-18 MED ORDER — KETOROLAC TROMETHAMINE 30 MG/ML IJ SOLN
INTRAMUSCULAR | Status: AC
  Filled 2023-06-18: qty 1

## 2023-06-18 MED ORDER — ACETAMINOPHEN 325 MG PO TABS
975.0000 mg | ORAL_TABLET | Freq: Once | ORAL | Status: AC
  Administered 2023-06-18: 975 mg via ORAL

## 2023-06-18 MED ORDER — DEXAMETHASONE SODIUM PHOSPHATE 10 MG/ML IJ SOLN
10.0000 mg | Freq: Once | INTRAMUSCULAR | Status: AC
  Administered 2023-06-18: 10 mg via INTRAMUSCULAR

## 2023-06-18 NOTE — Discharge Instructions (Addendum)
  Meds ordered this encounter  Medications   dexamethasone (DECADRON) injection 10 mg   ketorolac (TORADOL) 30 MG/ML injection 15 mg   HYDROcodone-acetaminophen (NORCO/VICODIN) 5-325 MG tablet    Sig: Take 0.5-1 tablets by mouth every 6 (six) hours as needed for severe pain.    Dispense:  4 tablet    Refill:  0   acetaminophen (TYLENOL) tablet 975 mg   Your blood pressure was noted to be elevated during your visit today. If you are currently taking medication for high blood pressure, please ensure you are taking this as directed. If you do not have a history of high blood pressure and your blood pressure remains persistently elevated, you may need to begin taking a medication at some point. You may return here within the next few days to recheck if unable to see your primary care provider or if you do not have a one.  BP (!) 159/88 (BP Location: Left Arm)   Pulse 89   Temp 97.6 F (36.4 C) (Oral)   Resp 16   SpO2 93%   BP Readings from Last 3 Encounters:  06/18/23 (!) 159/88  03/05/23 (!) 162/82  02/12/23 135/69

## 2023-06-18 NOTE — ED Triage Notes (Addendum)
Patient reports that she woke this AM with increased pain to the right knee. Patient states she has a history of having fluid removed.  Patient denies any injury.  Patient states she used BioFreeze and pain athletic tape. Patient states she took Aleve x 2 tabs at 0900.

## 2023-06-18 NOTE — ED Provider Notes (Signed)
[facility]   [id number] 06/18/23 Arrival Time: 1021  ASSESSMENT & PLAN:  1. Acute pain of right knee   2. Elevated blood pressure reading in office with diagnosis of hypertension    Denies trauma. Acute on chronic pain. No indication for plain imaging. Elevated BP; no symptoms.  Meds ordered this encounter  Medications   dexamethasone (DECADRON) injection 10 mg   ketorolac (TORADOL) 30 MG/ML injection 15 mg   HYDROcodone-acetaminophen (NORCO/VICODIN) 5-325 MG tablet    Sig: Take 0.5-1 tablets by mouth every 6 (six) hours as needed for severe pain.    Dispense:  4 tablet    Refill:  0   acetaminophen (TYLENOL) tablet 975 mg   Reports DM is controlled. Watch for temporary elevation of blood sugars with IM decadron. Work/school excuse note: not needed. Recommend:  Follow-up Information     Schedule an appointment as soon as possible for a visit  with August Saucer, Corrie Mckusick, MD.   Specialty: Orthopedic Surgery Contact information: [address] [place] [address] [phone]         Marcine Matar, MD.   Specialty: Internal Medicine Why: To recheck your blood pressure. Contact information: [address] [place] [address] [phone]                 [place] Controlled Substances Registry consulted for this patient. I feel the risk/benefit ratio today is favorable for proceeding with this prescription for a controlled substance. Medication sedation precautions given.  Reviewed expectations re: course of current medical issues. Questions answered. Outlined signs and symptoms indicating need for more acute intervention. Patient verbalized understanding. After Visit Summary given.  SUBJECTIVE: History from: patient. Linda Cooper is a 61 y.o. female who reports acute on chronic R knee pain; noted upon waking today; denies trauma/injury. Aleve early am without relief; one tablet. No extremity sensation changes or weakness. Is  ambulatory but with significant pain. Could not get in to see her orthopaedist.  Past Surgical History:  Procedure Laterality Date   ABDOMINAL HYSTERECTOMY  2006    HERNIA REPAIR  2006    Umbilical       OBJECTIVE:  Vitals:   06/18/23 1034  BP: (!) 159/88  Pulse: 89  Resp: 16  Temp: 97.6 F (36.4 C)  TempSrc: Oral  SpO2: 93%    General appearance: alert; no distress HEENT: ; AT Neck: supple with FROM Resp: unlabored respirations Extremities: RLE: warm with well perfused appearance; poorly localized marked tenderness over right medial knee; without gross deformities; swelling: none; bruising: none; knee ROM: normal; size limits exam Skin: warm and dry; no visible rashes Neurologic: normal sensation and strength of bilateral LE Psychological: alert and cooperative; normal mood and affect    Allergies  Allergen Reactions   Ace Inhibitors Cough   Erythromycin Base     Cramping in abdomen is reaction/no rash    Past Medical History:  Diagnosis Date   'Light-for-dates' infant with signs of fetal malnutrition    Anemia    Arthritis    Asthma    Diabetes mellitus without complication (HCC) Dx 2007   GERD (gastroesophageal reflux disease)    Hypertension Dx 1990   Knee pain, bilateral    Obesity    Rheumatic fever childhood    has intermittent murmur and hand arthritis    Social History   Socioeconomic History   Marital status: Single    Spouse name: Not on file   Number of children:  1    Years of education: college    Highest education level: Not on file  Occupational History   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    Son is 67.        She is motivated to lose weight and improve health by the expected death of her father  January 06, 2017) and the sudden deaths of her cousin (found dead in car one month after death of his wife March 06, 2017)  and his wife (died after childbirth February 06, 2017)    Exercise: minimal    Social Determinants of Health   Financial Resource Strain: Not on file  Food Insecurity: Not on file  Transportation Needs: Not on file  Physical Activity: Not on file  Stress: Not on file  Social Connections: Not on file   Family History  Problem Relation Age of Onset   Hypertension Mother    Diabetes Mother    Stroke Mother    Heart disease Mother    Obesity Mother    Hypertension Father    Diabetes Father    Stroke Father    Obesity Father    Heart disease Father    Hyperlipidemia Father    Heart disease Sister    Diabetes Brother    Hypertension Brother    Gout Brother    Breast cancer Cousin    Cancer Neg Hx    Past Surgical History:  Procedure Laterality Date   ABDOMINAL HYSTERECTOMY  2006    HERNIA REPAIR  2006    Umbilical        Mardella Layman, MD 06/18/23 1322

## 2023-06-19 ENCOUNTER — Other Ambulatory Visit: Payer: Self-pay

## 2023-06-30 ENCOUNTER — Ambulatory Visit (INDEPENDENT_AMBULATORY_CARE_PROVIDER_SITE_OTHER): Payer: Self-pay | Admitting: Surgical

## 2023-06-30 ENCOUNTER — Other Ambulatory Visit (INDEPENDENT_AMBULATORY_CARE_PROVIDER_SITE_OTHER): Payer: Self-pay

## 2023-06-30 DIAGNOSIS — M170 Bilateral primary osteoarthritis of knee: Secondary | ICD-10-CM

## 2023-06-30 DIAGNOSIS — M1711 Unilateral primary osteoarthritis, right knee: Secondary | ICD-10-CM

## 2023-07-02 ENCOUNTER — Other Ambulatory Visit: Payer: Self-pay

## 2023-07-04 ENCOUNTER — Encounter: Payer: Self-pay | Admitting: Surgical

## 2023-07-04 ENCOUNTER — Other Ambulatory Visit: Payer: Self-pay

## 2023-07-04 MED ORDER — BUPIVACAINE HCL 0.25 % IJ SOLN
4.0000 mL | INTRAMUSCULAR | Status: AC | PRN
Start: 2023-06-30 — End: 2023-06-30
  Administered 2023-06-30: 4 mL via INTRA_ARTICULAR

## 2023-07-04 MED ORDER — METHYLPREDNISOLONE ACETATE 40 MG/ML IJ SUSP
40.0000 mg | INTRAMUSCULAR | Status: AC | PRN
Start: 2023-06-30 — End: 2023-06-30
  Administered 2023-06-30: 40 mg via INTRA_ARTICULAR

## 2023-07-04 MED ORDER — LIDOCAINE HCL 1 % IJ SOLN
5.0000 mL | INTRAMUSCULAR | Status: AC | PRN
Start: 2023-06-30 — End: 2023-06-30
  Administered 2023-06-30: 5 mL

## 2023-07-04 NOTE — Progress Notes (Signed)
Office Visit Note   Patient: Linda Cooper           Date of Birth: 12/09/1961           MRN: [id number] Visit Date: 06/30/2023 Requested by: Marcine Matar, MD [address] [place] [address] [place],  [place] [address] PCP: Marcine Matar, MD  Subjective: Chief Complaint  Patient presents with  . Right Knee - Pain    HPI: Linda Cooper is a 62 y.o. female who presents to the office reporting right knee pain.  Has history of bilateral knee osteoarthritis and has been previously seen with some relief from injections.  She states that her right knee pain has been significantly increased in pain ever since last week and when she stood up and noticed increased pain on the medial side.  She had not had any falls or injuries aside from this painful sensation that feels like a stabbing sensation in the medial aspect of the knee when she stood up from the chair.  She initially was unable to bear weight.  She is now improved from the initial onset of pain but still not back to baseline.  She is walking with a significant limp and walking slower.  She states that her right knee used to be her "good knee" relative to the left knee.  She had to go to urgent care for evaluation.  Taking Aleve for pain which is not really helping.  She was given a Toradol injection and Decadron injection intramuscularly at the urgent care appointment.  This has been going on for almost 2 weeks at this point.  Last A1c 6.2.  Has to sleep with a pillow between her legs.  No groin pain..                ROS: All systems reviewed are negative as they relate to the chief complaint within the history of present illness.  Patient denies fevers or chills.  Assessment & Plan: Visit Diagnoses:  1. Primary osteoarthritis of both knees     Plan: Patient is a 62 year old female who presents for evaluation of acute right knee pain.  She has history of end-stage right knee osteoarthritis.  No significant change of the  appearance of her right knee radiographically compared with prior radiographs.  This may represent degenerative meniscal tear of what is left of her likely diminutive medial meniscus or could just be exacerbation of her primarily medial compartment arthritis.  After discussion of options, she would like to try cortisone injection into both knees today.  This was administered and patient tolerated procedure well without complication.  We also discussed with her today that surgical intervention is not possible at this time based on her BMI and weight loss is imperative if she would like to have knee replacement in the future. Will plan for her to follow-up with the office as needed and certainly she was recommended to reach out if pain does not improve for any period of time.    Follow-Up Instructions: Return if symptoms worsen or fail to improve.   Orders:  Orders Placed This Encounter  Procedures  . XR KNEE 3 VIEW RIGHT   No orders of the defined types were placed in this encounter.     Procedures: Large Joint Inj: bilateral knee on 06/30/2023 10:40 AM Indications: diagnostic evaluation, joint swelling and pain Details: 18 G 1.5 in needle, superolateral approach  Arthrogram: No  Medications (Right): 5 mL lidocaine 1 %; 4  mL bupivacaine 0.25 %; 40 mg methylPREDNISolone acetate 40 MG/ML Medications (Left): 5 mL lidocaine 1 %; 4 mL bupivacaine 0.25 %; 40 mg methylPREDNISolone acetate 40 MG/ML Outcome: tolerated well, no immediate complications Procedure, treatment alternatives, risks and benefits explained, specific risks discussed. Consent was given by the patient. Immediately prior to procedure a time out was called to verify the correct patient, procedure, equipment, support staff and site/side marked as required. Patient was prepped and draped in the usual sterile fashion.     Clinical Data: No additional findings.  Objective: Vital Signs: There were no vitals taken for this  visit.  Physical Exam:  Constitutional: Patient appears well-developed HEENT:  Head: Normocephalic Eyes:EOM are normal Neck: Normal range of motion Cardiovascular: Normal rate Pulmonary/chest: Effort normal Neurologic: Patient is alert Skin: Skin is warm Psychiatric: Patient has normal mood and affect  Ortho Exam: Ortho exam demonstrates bilateral knees with significant flexion contracture of about 15 degrees.  Flexes both knees to about 85 degrees.  She has severe tenderness through the medial joint line of both knees and moderate tenderness through the lateral joint line of both knees.  She has no pain with hip range of motion bilaterally.  No cellulitis or skin changes noted.  No calf tenderness bilaterally.  Specialty Comments:  No specialty comments available.  Imaging: No results found.   PMFS History: Patient Active Problem List   Diagnosis Date Noted  . Urge incontinence 08/31/2021  . Pain, dental 08/16/2021  . Type 2 diabetes mellitus with hyperglycemia, without long-term current use of insulin (HCC) 06/22/2020  . Trigger middle finger of left hand 11/08/2019  . Carpal tunnel syndrome of left wrist 07/09/2019  . Morbid obesity with body mass index (BMI) of 50.0 to 59.9 in adult (HCC) 10/16/2018  . Morbid obesity (HCC) 11/11/2014  . PCOS (polycystic ovarian syndrome) 11/10/2014  . Degenerative arthritis of knee, bilateral 11/10/2014  . Diabetes mellitus without complication (HCC)   . Elevated blood pressure reading in office with diagnosis of hypertension    Past Medical History:  Diagnosis Date  . 'Light-for-dates' infant with signs of fetal malnutrition   . Anemia   . Arthritis   . Asthma   . Diabetes mellitus without complication (HCC) Dx 2007  . GERD (gastroesophageal reflux disease)   . Hypertension Dx 1990  . Knee pain, bilateral   . Obesity   . Rheumatic fever childhood    has intermittent murmur and hand arthritis     Family History  Problem  Relation Age of Onset  . Hypertension Mother   . Diabetes Mother   . Stroke Mother   . Heart disease Mother   . Obesity Mother   . Hypertension Father   . Diabetes Father   . Stroke Father   . Obesity Father   . Heart disease Father   . Hyperlipidemia Father   . Heart disease Sister   . Diabetes Brother   . Hypertension Brother   . Gout Brother   . Breast cancer Cousin   . Cancer Neg Hx     Past Surgical History:  Procedure Laterality Date  . ABDOMINAL HYSTERECTOMY  2006   . HERNIA REPAIR  2006    Umbilical    Social History   Occupational History  . 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 Smoking status: Never  . Smokeless tobacco: Never  Vaping Use  . Vaping status: Never Used  Substance  and Sexual Activity  . Alcohol use: No  . Drug use: No  . Sexual activity: Not Currently

## 2023-07-22 ENCOUNTER — Other Ambulatory Visit: Payer: Self-pay

## 2023-07-25 ENCOUNTER — Other Ambulatory Visit: Payer: Self-pay

## 2023-08-05 ENCOUNTER — Other Ambulatory Visit: Payer: Self-pay | Admitting: Internal Medicine

## 2023-08-05 ENCOUNTER — Other Ambulatory Visit: Payer: Self-pay

## 2023-08-05 DIAGNOSIS — N3941 Urge incontinence: Secondary | ICD-10-CM

## 2023-08-05 MED ORDER — SOLIFENACIN SUCCINATE 10 MG PO TABS
10.0000 mg | ORAL_TABLET | Freq: Every day | ORAL | 0 refills | Status: DC
Start: 2023-08-05 — End: 2023-09-04
  Filled 2023-08-05: qty 30, 30d supply, fill #0

## 2023-08-12 ENCOUNTER — Ambulatory Visit: Payer: Self-pay | Attending: Internal Medicine | Admitting: Internal Medicine

## 2023-08-12 ENCOUNTER — Encounter: Payer: Self-pay | Admitting: Internal Medicine

## 2023-08-12 ENCOUNTER — Other Ambulatory Visit: Payer: Self-pay

## 2023-08-12 DIAGNOSIS — E1169 Type 2 diabetes mellitus with other specified complication: Secondary | ICD-10-CM

## 2023-08-12 DIAGNOSIS — G5603 Carpal tunnel syndrome, bilateral upper limbs: Secondary | ICD-10-CM

## 2023-08-12 DIAGNOSIS — E119 Type 2 diabetes mellitus without complications: Secondary | ICD-10-CM

## 2023-08-12 DIAGNOSIS — E1159 Type 2 diabetes mellitus with other circulatory complications: Secondary | ICD-10-CM

## 2023-08-12 DIAGNOSIS — D1722 Benign lipomatous neoplasm of skin and subcutaneous tissue of left arm: Secondary | ICD-10-CM

## 2023-08-12 DIAGNOSIS — M170 Bilateral primary osteoarthritis of knee: Secondary | ICD-10-CM

## 2023-08-12 DIAGNOSIS — Z7984 Long term (current) use of oral hypoglycemic drugs: Secondary | ICD-10-CM

## 2023-08-12 DIAGNOSIS — I152 Hypertension secondary to endocrine disorders: Secondary | ICD-10-CM

## 2023-08-12 LAB — POCT GLYCOSYLATED HEMOGLOBIN (HGB A1C): HbA1c, POC (controlled diabetic range): 6.2 % (ref 0.0–7.0)

## 2023-08-12 LAB — GLUCOSE, POCT (MANUAL RESULT ENTRY): POC Glucose: 102 mg/dL — AB (ref 70–99)

## 2023-08-12 MED ORDER — BLOOD GLUCOSE MONITOR SYSTEM W/DEVICE KIT
1.0000 [IU] | PACK | Freq: Every day | 0 refills | Status: DC
Start: 2023-08-12 — End: 2024-06-09
  Filled 2023-08-12: qty 1, 1d supply, fill #0

## 2023-08-12 NOTE — Progress Notes (Signed)
Patient ID: Linda Cooper, female    DOB: [birth year]  MRN: [id number]  CC: Medical Management of Chronic Issues (Numbness and tingling in left hand./Needs glucose device)   Subjective: Linda Cooper is a 62 y.o. female who presents for chronic ds management Her concerns today include:  Hx of HTN, DM, morbid obesity and OA knees,     HTN  Continue amlodipine 5 mg daily, hydralazine 100 mg 2 times a day, Hyzaar 100/25 mg daily.  Took meds already this a.m Checks BP 2x/day.  Forgot log today.  Range past few days BP range of 120-130s/70-80s  DM/Obesity: Results for orders placed or performed in visit on 08/12/23  HgB A1c  Result Value Ref Range   Hemoglobin A1C     HbA1c POC (<> result, manual entry)     HbA1c, POC (prediabetic range)     HbA1c, POC (controlled diabetic range) 6.2 0.0 - 7.0 %  Glucose (CBG)  Result Value Ref Range   POC Glucose 102 (A) 70 - 99 mg/dl  -eating out a little more since mom is with her.  Up 4 lbs since last visit Still on Metformin 500 mg daily.  Needs new glucometer.  Reports compliance with taking metformin. Numbness in fingers both hands LT >RT.  Pt is LT handed. Wakes up if sleeps with hand under head. She she crochets a lot.  Reports being diagnosed with carpal tunnel syndrome in the past and had undergone nerve conduction study.  Has a pair of cock-up wrist splint at home but has not been using it. Saw ortho 06/2023 and had inj both knees which helped.  Trying to hold on until Medicare next May to have TKR  Has mass LT forearm for 10 yrs Has increased in size slowly.  Not painful.  Few other smaller ones on same arm  HM:  wants to hold off on Flu shot until later in season.  Declines Shingrix for now even with getting it through our pharmavcy Patient Active Problem List   Diagnosis Date Noted   Urge incontinence 08/31/2021   Pain, dental 08/16/2021   Type 2 diabetes mellitus with hyperglycemia, without long-term current use of insulin  (HCC) 06/22/2020   Trigger middle finger of left hand 11/08/2019   Carpal tunnel syndrome of left wrist 07/09/2019   Morbid obesity with body mass index (BMI) of 50.0 to 59.9 in adult [facility]) 10/16/2018   Morbid obesity (HCC) 11/11/2014   PCOS (polycystic ovarian syndrome) 11/10/2014   Degenerative arthritis of knee, bilateral 11/10/2014   Diabetes mellitus without complication (HCC)    Elevated blood pressure reading in office with diagnosis of hypertension      Current Outpatient Medications on File Prior to Visit  Medication Sig Dispense Refill   albuterol (VENTOLIN HFA) 108 (90 Base) MCG/ACT inhaler Inhale 2 puffs into the lungs every 4 (four) hours as needed for wheezing. 6.7 g 1   amLODipine (NORVASC) 5 MG tablet Take 1 tablet (5 mg total) by mouth daily. 90 tablet 3   hydrALAZINE (APRESOLINE) 100 MG tablet Take 1 tablet (100 mg total) by mouth 2 (two) times daily. 180 tablet 3   HYDROcodone-acetaminophen (NORCO/VICODIN) 5-325 MG tablet Take 0.5-1 tablets by mouth every 6 (six) hours as needed for severe pain. 4 tablet 0   losartan-hydrochlorothiazide (HYZAAR) 100-25 MG tablet TAKE 1 TABLET BY MOUTH DAILY. 90 tablet 3   metFORMIN (GLUCOPHAGE-XR) 500 MG 24 hr tablet Take 1 tablet (500 mg total) by mouth daily with  breakfast. 90 tablet 0   MICROLET LANCETS MISC Use as directed once daily 100 each 12   Multiple Vitamins-Minerals (MULTIVITAMIN WITH MINERALS) tablet Take 1 tablet by mouth daily.     naproxen (NAPROSYN) 500 MG tablet Take 1 tablet (500 mg total) by mouth 2 (two) times daily. 60 tablet 0   NON FORMULARY SEGA PRO BLADDER HEALTH 100 MG DAILY     predniSONE (DELTASONE) 20 MG tablet Take 2 tablets (40 mg total) by mouth daily. 10 tablet 0   solifenacin (VESICARE) 10 MG tablet Take 1 tablet (10 mg total) by mouth daily. 30 tablet 0   No current facility-administered medications on file prior to visit.    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    Son is 30.       She is motivated to lose weight and improve health by the expected death of her father  2017-01-02) and the sudden deaths of her cousin (found dead in car one month after death of his wife 2017-03-02)  and his wife (died after childbirth 02/02/2017)    Exercise: minimal    Social Determinants of Health   Financial Resource Strain: Not on file  Food Insecurity: Not on file  Transportation Needs: Not on file  Physical Activity: Not on file  Stress: Not on file  Social Connections: Not on file  Intimate Partner Violence: Not on file    Family History  Problem Relation Cooper of Onset   Hypertension Mother    Diabetes Mother    Stroke Mother    Heart disease Mother    Obesity Mother    Hypertension Father    Diabetes Father    Stroke Father    Obesity Father    Heart disease Father    Hyperlipidemia Father    Heart disease Sister    Diabetes Brother    Hypertension Brother    Gout Brother    Breast cancer Cousin    Cancer Neg Hx     Past Surgical History:  Procedure Laterality Date   ABDOMINAL HYSTERECTOMY  2006    HERNIA REPAIR  2006    Umbilical     ROS: Review of Systems Negative except as stated above  PHYSICAL EXAM: BP 138/83   Pulse 89   Ht 5\' 2"  (1.575 m)   Wt 289 lb (131.1 kg)   SpO2 98%   BMI 52.86 kg/m   Wt Readings  from Last 3 Encounters:  08/12/23 289 lb (131.1 kg)  02/12/23 289 lb 6.4 oz (131.3 kg)  01/29/23 285 lb (129.3 kg)    Physical Exam  General appearance - alert, well appearing, older African-American female,  morbidly obese and in no distress Mental status - normal mood, behavior, speech, dress, motor activity, and thought processes Neck - supple, no significant adenopathy Chest - clear to auscultation, no wheezes, rales or rhonchi, symmetric air entry Heart - normal rate, regular rhythm, normal S1, S2, no murmurs, rubs, clicks or gallops Extremities - peripheral pulses normal, no pedal edema, no clubbing or cyanosis Skin: 5 x 5 soft movable subcutaneous mass midway down the left forearm palmar surface.  It is nontender to touch.     Latest Ref Rng & Units 01/10/2023    3:51 PM 09/06/2022    3:59 PM 06/24/2022   10:15 AM  CMP  Glucose 70 - 99 mg/dL 93   161   BUN 8 - 27 mg/dL 31   23   Creatinine 0.96 - 1.00 mg/dL 0.45   4.09   Sodium 811 - 144 mmol/L 140   139   Potassium 3.5 - 5.2 mmol/L 4.1   4.2   Chloride 96 - 106 mmol/L 100   103   CO2 20 - 29 mmol/L 26   27   Calcium 8.7 - 10.3 mg/dL 9.4   9.3   Total Protein 6.0 - 8.5 g/dL  6.7    Total Bilirubin 0.0 - 1.2 mg/dL  0.6    Alkaline Phos 44 - 121 IU/L  93    AST 0 - 40 IU/L  20    ALT 0 - 32 IU/L  17     Lipid Panel     Component Value Date/Time   CHOL 148 09/06/2022 1559   TRIG 97 09/06/2022 1559   HDL 72 09/06/2022 1559   CHOLHDL 2.1 09/06/2022 1559   CHOLHDL 2.0 11/10/2014 1148   VLDL 21 11/10/2014 1148   LDLCALC 58 09/06/2022 1559    CBC    Component Value Date/Time   WBC 9.0 01/10/2023 1551   WBC 7.5 06/24/2022 1015   RBC 4.32 01/10/2023 1551   RBC 4.63 06/24/2022 1015   HGB 11.3 01/10/2023 1551   HCT 33.7 (L) 01/10/2023 1551   PLT 420 01/10/2023 1551   MCV 78 (L) 01/10/2023 1551   MCH 26.2 (L) 01/10/2023 1551   MCH 25.5 (L) 06/24/2022 1015   MCHC 33.5 01/10/2023 1551   MCHC 32.2 06/24/2022 1015    RDW 15.5 (H) 01/10/2023 1551   LYMPHSABS 2.5 06/24/2022 1015   LYMPHSABS 3.0 12/29/2018 1546   MONOABS 0.7 06/24/2022 1015   EOSABS 0.2 06/24/2022 1015   EOSABS 0.4 12/29/2018 1546   BASOSABS 0.1 06/24/2022 1015   BASOSABS 0.1 12/29/2018 1546    ASSESSMENT AND PLAN:  1. Type 2 diabetes mellitus with morbid obesity (HCC) Type 2 diabetes treated with oral medication Controlled with metformin.  Encouraged her to get back on track with eating habits. - HgB A1c - Glucose (CBG) - Lipid panel - Hepatic function panel - Microalbumin / creatinine urine ratio - CBC - Blood Glucose Monitoring Suppl (BLOOD GLUCOSE MONITOR SYSTEM) w/Device KIT; use daily as directed.  Dispense: 1 kit; Refill: 0  2. Hypertension associated with type 2 diabetes mellitus (HCC) Not at goal but patient reports good blood pressure readings at home. Continue amlodipine 5 mg daily, hydralazine 100 mg23 times a day, Hyzaar 100/25 mg daily.   3. Primary osteoarthritis of both knees Weight loss is needed as she qualifies for Medicare next year  4. Bilateral carpal tunnel syndrome Discussed diagnosis and management.  She has cock up wrist splints at home which have  encouraged her to use consistently and at night.  5. Lipoma of left upper extremity Advised patient that I can refer her to a general surgeon to have this removed since it has been increasing in size.  She wants to hold off until she has insurance.  Informed her to apply for the [facility] discount and let me know once approved.    Patient was given the opportunity to ask questions.  Patient verbalized understanding of the plan and was able to repeat key elements of the plan.   This documentation was completed using Paediatric nurse.  Any transcriptional errors are unintentional.  Orders Placed This Encounter  Procedures   Lipid panel   Hepatic function panel   Microalbumin / creatinine urine ratio   CBC   HgB A1c   Glucose (CBG)      Requested Prescriptions   Signed Prescriptions Disp Refills   Blood Glucose Monitoring Suppl (BLOOD GLUCOSE MONITOR SYSTEM) w/Device KIT 1 kit 0    Sig: use daily as directed.    Return in about 4 months (around 12/12/2023).  Jonah Blue, MD, FACP

## 2023-08-12 NOTE — Patient Instructions (Signed)

## 2023-08-13 ENCOUNTER — Encounter: Payer: Self-pay | Admitting: Internal Medicine

## 2023-08-13 ENCOUNTER — Other Ambulatory Visit: Payer: Self-pay

## 2023-08-13 ENCOUNTER — Other Ambulatory Visit: Payer: Self-pay | Admitting: Pharmacist

## 2023-08-13 DIAGNOSIS — E1159 Type 2 diabetes mellitus with other circulatory complications: Secondary | ICD-10-CM | POA: Insufficient documentation

## 2023-08-13 DIAGNOSIS — E1169 Type 2 diabetes mellitus with other specified complication: Secondary | ICD-10-CM | POA: Insufficient documentation

## 2023-08-13 DIAGNOSIS — D509 Iron deficiency anemia, unspecified: Secondary | ICD-10-CM | POA: Insufficient documentation

## 2023-08-13 LAB — HEPATIC FUNCTION PANEL
ALT: 15 IU/L (ref 0–32)
AST: 18 IU/L (ref 0–40)
Albumin: 3.8 g/dL — ABNORMAL LOW (ref 3.9–4.9)
Alkaline Phosphatase: 80 IU/L (ref 44–121)
Bilirubin Total: 0.6 mg/dL (ref 0.0–1.2)
Bilirubin, Direct: 0.16 mg/dL (ref 0.00–0.40)
Total Protein: 6.5 g/dL (ref 6.0–8.5)

## 2023-08-13 LAB — LIPID PANEL
Chol/HDL Ratio: 2 ratio (ref 0.0–4.4)
Cholesterol, Total: 140 mg/dL (ref 100–199)
HDL: 71 mg/dL (ref >39)
LDL Chol Calc (NIH): 50 mg/dL (ref 0–99)
Triglycerides: 109 mg/dL (ref 0–149)
VLDL Cholesterol Cal: 19 mg/dL (ref 5–40)

## 2023-08-13 LAB — CBC
Hematocrit: 37.5 % (ref 34.0–46.6)
Hemoglobin: 11.7 g/dL (ref 11.1–15.9)
MCH: 25.2 pg — ABNORMAL LOW (ref 26.6–33.0)
MCHC: 31.2 g/dL — ABNORMAL LOW (ref 31.5–35.7)
MCV: 81 fL (ref 79–97)
Platelets: 364 10*3/uL (ref 150–450)
RBC: 4.64 x10E6/uL (ref 3.77–5.28)
RDW: 16 % — ABNORMAL HIGH (ref 11.7–15.4)
WBC: 6.3 10*3/uL (ref 3.4–10.8)

## 2023-08-13 LAB — MICROALBUMIN / CREATININE URINE RATIO
Creatinine, Urine: 141.9 mg/dL
Microalb/Creat Ratio: <2 mg/g{creat} (ref 0–29)
Microalbumin, Urine: <3 ug/mL

## 2023-08-13 MED ORDER — TRUE METRIX BLOOD GLUCOSE TEST VI STRP
ORAL_STRIP | 6 refills | Status: DC
  Filled 2023-08-13: qty 100, 100d supply, fill #0

## 2023-08-13 MED ORDER — TRUEPLUS LANCETS 28G MISC
6 refills | Status: DC
  Filled 2023-08-13: qty 100, 100d supply, fill #0

## 2023-08-19 ENCOUNTER — Other Ambulatory Visit: Payer: Self-pay

## 2023-08-20 ENCOUNTER — Other Ambulatory Visit: Payer: Self-pay

## 2023-08-22 ENCOUNTER — Ambulatory Visit: Payer: Self-pay | Attending: Cardiology | Admitting: Cardiology

## 2023-08-22 NOTE — Progress Notes (Deleted)
Cardiology Office Note:    Date:  08/22/2023   ID:  Cortina, Halim Jun 10, 1961, MRN [id number]  PCP:  Marcine Matar, MD  Cardiologist:  None  Electrophysiologist:  None   Referring MD: Marcine Matar, MD   No chief complaint on file.   History of Present Illness:    Linda Cooper is a 62 y.o. female with a hx of asthma, diabetes, hypertension who presents for follow-up.  She was referred by Dr. Laural Benes for evaluation of dyspnea, initially seen 11/27/22.  She reports she has been having progressively worsening dyspnea over the last months.  Reports short of breath with minimal exertion.  She denies any chest pain.  Also having lightheadedness but denies any syncope.  No palpitations.  Does report she has lower extremity edema.  She has not been exercising.  No smoking history.  Mother had CVA in 52s, father had multiple CVAs with first in his 23s.  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  Echocardiogram 11/06/2022 showed EF 50 to 55%, grade 2 diastolic dysfunction, normal RV function, no significant valvular disease.  At initial clinic visit, cardiac PET was ordered but has not been done, states that could not afford.    Since last clinic visit,  Continues to have dyspnea.  Denies any chest pain.  Does report some lower extremity edema.  Denies any palpitations.  Reports some lightheadedness but denies any syncope.   Past Medical History:  Diagnosis Date   'Light-for-dates' infant with signs of fetal malnutrition    Anemia    Arthritis    Asthma    Diabetes mellitus without complication (HCC) Dx 2007   GERD (gastroesophageal reflux disease)    Hypertension Dx 1990   Knee pain, bilateral    Obesity    Rheumatic fever childhood    has intermittent murmur and hand arthritis     Past Surgical History:  Procedure Laterality Date   ABDOMINAL HYSTERECTOMY  2006    HERNIA REPAIR  2006    Umbilical     Current Medications: No outpatient medications have  been marked as taking for the 08/22/23 encounter (Appointment) with Little Ishikawa, MD.     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    Son is 33.       She is motivated to lose weight and improve health by the expected death of her father  27-Jan-2017) and the sudden deaths of her cousin (found dead in car one month after death of his wife Mar 27, 2017)  and his wife (died after childbirth 02-27-2017)    Exercise: minimal    Social Determinants of Health   Financial Resource Strain: Not on file  Food Insecurity: Not on file  Transportation Needs: Not on  file  Physical Activity: Not on file  Stress: Not on file  Social Connections: Not on file     Family History: The patient's family history includes Breast cancer in her cousin; Diabetes in her brother, father, and mother; Gout in her brother; Heart disease in her father, mother, and sister; Hyperlipidemia in her father; Hypertension in her brother, father, and mother; Obesity in her father and mother; Stroke in her father and mother. There is no history of Cancer.  ROS:   Please see the history of present illness.     All other systems reviewed and are negative.  EKGs/Labs/Other Studies Reviewed:    The following studies were reviewed today:   EKG:   11/27/2022: Normal sinus rhythm, rate 84, low voltage, nonspecific T wave flattening  Recent  Labs: 01/10/2023: BUN 31; Creatinine, Ser 0.94; Potassium 4.1; Sodium 140 08/12/2023: ALT 15; Hemoglobin 11.7; Platelets 364  Recent Lipid Panel    Component Value Date/Time   CHOL 140 08/12/2023 1656   TRIG 109 08/12/2023 1656   HDL 71 08/12/2023 1656   CHOLHDL 2.0 08/12/2023 1656   CHOLHDL 2.0 11/10/2014 1148   VLDL 21 11/10/2014 1148   LDLCALC 50 08/12/2023 1656    Physical Exam:    VS:  There were no vitals taken for this visit.    Wt Readings from Last 3 Encounters:  08/12/23 289 lb (131.1 kg)  02/12/23 289 lb 6.4 oz (131.3 kg)  01/29/23 285 lb (129.3 kg)     GEN:   in no acute distress HEENT: Normal NECK: No JVD; No carotid bruits LYMPHATICS: No lymphadenopathy CARDIAC: RRR, no murmurs, rubs, gallops RESPIRATORY:  Clear to auscultation without rales, wheezing or rhonchi  ABDOMEN: Soft, non-tender, non-distended MUSCULOSKELETAL:  No edema; No deformity  SKIN: Warm and dry NEUROLOGIC:  Alert and oriented x 3 PSYCHIATRIC:  Normal affect   ASSESSMENT:    No diagnosis found.   PLAN:    Dyspnea: reports progressive dyspnea on exertion.  Could represent anginal equivalent, as multiple CAD risk factors (age, hypertension, T2DM).  Echocardiogram 11/06/2022 showed EF 50 to 55%, grade 2 diastolic dysfunction, normal RV function, no significant valvular disease. -Not a good candidate for coronary CTA given BMI, recommended stress PET to evaluate for ischemia.  Reports stress test was too expensive so did not obtain.  Discussed importance of focusing on weight loss.  If continues to have shortness of breath after weight loss, can revisit ischemic testing  Hypertension: On hydralazine 100 mg 2 times daily, losartan-HCTZ 100-25 mg daily, amlodipine 5 mg daily.  Appears controlled  T2DM: On metformin and Rybelsus.  A1c 6.4% on 09/06/2022.  Recommend starting statin given history of diabetes, LDL is 58 on 09/06/2022, started atorvastatin 10 mg daily  Hyperlipidemia: On atorvastatin  10 mg daily.  LDL 58 on 09/06/2022  Snoring: Check sleep study.  STOPBANG 4***  Morbid obesity: There is no height or weight on file to calculate BMI.  Diet/exercise recommended.  She previously tried healthy weight and wellness but not covered by insurance   RTC in 6 months   Medication Adjustments/Labs and Tests Ordered: Current medicines are reviewed at length with the patient today.  Concerns regarding medicines are outlined above.  No orders of the defined types were placed in this encounter.  No orders of the defined types were placed in this encounter.   There are no Patient Instructions on file for this visit.   Signed, Little Ishikawa, MD  08/22/2023 9:57 AM  [facility]

## 2023-08-25 ENCOUNTER — Other Ambulatory Visit: Payer: Self-pay | Admitting: Physician Assistant

## 2023-08-27 MED ORDER — LOSARTAN POTASSIUM-HCTZ 100-25 MG PO TABS
1.0000 | ORAL_TABLET | Freq: Every day | ORAL | 1 refills | Status: DC
  Filled 2023-08-27: qty 90, 90d supply, fill #0
  Filled 2023-11-27 (×2): qty 90, 90d supply, fill #1

## 2023-08-27 NOTE — Telephone Encounter (Signed)
Requested medication (s) are due for refill today:-  Requested medication (s) are on the active medication list: no  Last refill:  07/24/22  Future visit scheduled:yes  Notes to clinic:  med expired 08/24/23   Requested Prescriptions  Pending Prescriptions Disp Refills   losartan-hydrochlorothiazide (HYZAAR) 100-25 MG tablet 90 tablet 3    Sig: TAKE 1 TABLET BY MOUTH DAILY.     Cardiovascular: ARB + Diuretic Combos Failed - 08/25/2023  6:09 PM      Failed - K in normal range and within 180 days    Potassium  Date Value Ref Range Status  01/10/2023 4.1 3.5 - 5.2 mmol/L Final         Failed - Na in normal range and within 180 days    Sodium  Date Value Ref Range Status  01/10/2023 140 134 - 144 mmol/L Final         Failed - Cr in normal range and within 180 days    Creat  Date Value Ref Range Status  12/26/2014 0.77 0.50 - 1.10 mg/dL Final   Creatinine, Ser  Date Value Ref Range Status  01/10/2023 0.94 0.57 - 1.00 mg/dL Final   Creatinine, Urine  Date Value Ref Range Status  11/10/2014 28.9 mg/dL Final    Comment:    No reference range established.         Failed - eGFR is 10 or above and within 180 days    GFR calc Af Amer  Date Value Ref Range Status  10/23/2020 101 >59 mL/min/1.73 Final    Comment:    **In accordance with recommendations from the NKF-ASN Task force,**   [facility] is in the process of updating its eGFR calculation to the   2021 CKD-EPI creatinine equation that estimates kidney function   without a race variable.    GFR, Estimated  Date Value Ref Range Status  06/24/2022 >60 >60 mL/min Final    Comment:    (NOTE) Calculated using the CKD-EPI Creatinine Equation (2021)    eGFR  Date Value Ref Range Status  01/10/2023 68 >59 mL/min/1.73 Final         Passed - Patient is not pregnant      Passed - Last BP in normal range    BP Readings from Last 1 Encounters:  08/12/23 138/83         Passed - Valid encounter within last 6 months     Recent Outpatient Visits           2 weeks ago Type 2 diabetes mellitus with morbid obesity (HCC)   [facility] Jonah Blue B, MD   7 months ago Type 2 diabetes mellitus with morbid obesity [facility])   [place] [facility] Jonah Blue B, MD   11 months ago Type 2 diabetes mellitus with morbid obesity [facility])   [place] [facility] [place] L, RPH-CPP   11 months ago Type 2 diabetes mellitus with morbid obesity [facility])   [place] [facility] Marcine Matar, MD   1 year ago Urge incontinence   [place] [facility] [place], Marzella Schlein, [place]       Future Appointments             In 3 months Laural Benes, Binnie Rail, MD [facility]

## 2023-08-28 ENCOUNTER — Other Ambulatory Visit: Payer: Self-pay

## 2023-09-04 ENCOUNTER — Other Ambulatory Visit: Payer: Self-pay

## 2023-09-04 ENCOUNTER — Other Ambulatory Visit: Payer: Self-pay | Admitting: Internal Medicine

## 2023-09-04 DIAGNOSIS — N3941 Urge incontinence: Secondary | ICD-10-CM

## 2023-09-04 MED ORDER — SOLIFENACIN SUCCINATE 10 MG PO TABS
10.0000 mg | ORAL_TABLET | Freq: Every day | ORAL | 0 refills | Status: DC
Start: 2023-09-04 — End: 2023-10-06
  Filled 2023-09-04: qty 30, 30d supply, fill #0

## 2023-09-05 ENCOUNTER — Other Ambulatory Visit: Payer: Self-pay

## 2023-09-11 ENCOUNTER — Other Ambulatory Visit: Payer: Self-pay

## 2023-09-11 ENCOUNTER — Other Ambulatory Visit: Payer: Self-pay | Admitting: Internal Medicine

## 2023-09-11 DIAGNOSIS — E1169 Type 2 diabetes mellitus with other specified complication: Secondary | ICD-10-CM

## 2023-09-11 MED ORDER — METFORMIN HCL ER 500 MG PO TB24
500.0000 mg | ORAL_TABLET | Freq: Every day | ORAL | 0 refills | Status: DC
Start: 2023-09-11 — End: 2023-12-11
  Filled 2023-09-11: qty 90, 90d supply, fill #0

## 2023-09-11 NOTE — Telephone Encounter (Signed)
Requested Prescriptions  Pending Prescriptions Disp Refills   metFORMIN (GLUCOPHAGE-XR) 500 MG 24 hr tablet 90 tablet 0    Sig: Take 1 tablet (500 mg total) by mouth daily with breakfast.     Endocrinology:  Diabetes - Biguanides Failed - 09/11/2023  9:39 AM      Failed - B12 Level in normal range and within 720 days    Vitamin B-12  Date Value Ref Range Status  12/29/2018 482 232 - 1,245 pg/mL Final         Failed - CBC within normal limits and completed in the last 12 months    WBC  Date Value Ref Range Status  08/12/2023 6.3 3.4 - 10.8 x10E3/uL Final  06/24/2022 7.5 4.0 - 10.5 K/uL Final   RBC  Date Value Ref Range Status  08/12/2023 4.64 3.77 - 5.28 x10E6/uL Final  06/24/2022 4.63 3.87 - 5.11 MIL/uL Final   Hemoglobin  Date Value Ref Range Status  08/12/2023 11.7 11.1 - 15.9 g/dL Final   Hematocrit  Date Value Ref Range Status  08/12/2023 37.5 34.0 - 46.6 % Final   MCHC  Date Value Ref Range Status  08/12/2023 31.2 (L) 31.5 - 35.7 g/dL Final  98/10/9146 82.9 30.0 - 36.0 g/dL Final   [facility]  Date Value Ref Range Status  08/12/2023 25.2 (L) 26.6 - 33.0 pg Final  06/24/2022 25.5 (L) 26.0 - 34.0 pg Final   MCV  Date Value Ref Range Status  08/12/2023 81 79 - 97 fL Final   No results found for: "PLTCOUNTKUC", "LABPLAT", "POCPLA" RDW  Date Value Ref Range Status  08/12/2023 16.0 (H) 11.7 - 15.4 % Final         Passed - Cr in normal range and within 360 days    Creat  Date Value Ref Range Status  12/26/2014 0.77 0.50 - 1.10 mg/dL Final   Creatinine, Ser  Date Value Ref Range Status  01/10/2023 0.94 0.57 - 1.00 mg/dL Final   Creatinine, Urine  Date Value Ref Range Status  11/10/2014 28.9 mg/dL Final    Comment:    No reference range established.         Passed - HBA1C is between 0 and 7.9 and within 180 days    HbA1c, POC (prediabetic range)  Date Value Ref Range Status  06/22/2020 6.1 5.7 - 6.4 % Final   HbA1c, POC (controlled diabetic range)  Date  Value Ref Range Status  08/12/2023 6.2 0.0 - 7.0 % Final         Passed - eGFR in normal range and within 360 days    GFR calc Af Amer  Date Value Ref Range Status  10/23/2020 101 >59 mL/min/1.73 Final    Comment:    **In accordance with recommendations from the NKF-ASN Task force,**   [facility] is in the process of updating its eGFR calculation to the   2021 CKD-EPI creatinine equation that estimates kidney function   without a race variable.    GFR, Estimated  Date Value Ref Range Status  06/24/2022 >60 >60 mL/min Final    Comment:    (NOTE) Calculated using the CKD-EPI Creatinine Equation (2021)    eGFR  Date Value Ref Range Status  01/10/2023 68 >59 mL/min/1.73 Final         Passed - Valid encounter within last 6 months    Recent Outpatient Visits           1 month ago Type 2 diabetes mellitus with morbid  obesity [facility])   [place] [facility] Jonah Blue B, MD   8 months ago Type 2 diabetes mellitus with morbid obesity [facility])   [place] [facility] Jonah Blue B, MD   1 year ago Type 2 diabetes mellitus with morbid obesity [facility])   [place] [facility] [place] L, RPH-CPP   1 year ago Type 2 diabetes mellitus with morbid obesity [facility])   [place] [facility] Marcine Matar, MD   1 year ago Urge incontinence   [place] [facility], Marzella Schlein, [place]       Future Appointments             In 3 months Laural Benes, Binnie Rail, MD [facility]

## 2023-09-15 ENCOUNTER — Other Ambulatory Visit: Payer: Self-pay

## 2023-09-25 IMAGING — MG MM DIGITAL SCREENING BILAT W/ TOMO AND CAD
6 of 10 series · 6 of 30 positions shown · non-contrast
Comparison: Previous exam(s).

CLINICAL DATA: Screening.

EXAM:
DIGITAL SCREENING BILATERAL MAMMOGRAM WITH TOMOSYNTHESIS AND CAD
TECHNIQUE: Bilateral screening digital craniocaudal and mediolateral oblique
mammograms were obtained. Bilateral screening digital breast
tomosynthesis was performed. The images were evaluated with
computer-aided detection.

[R CC synth-2D]
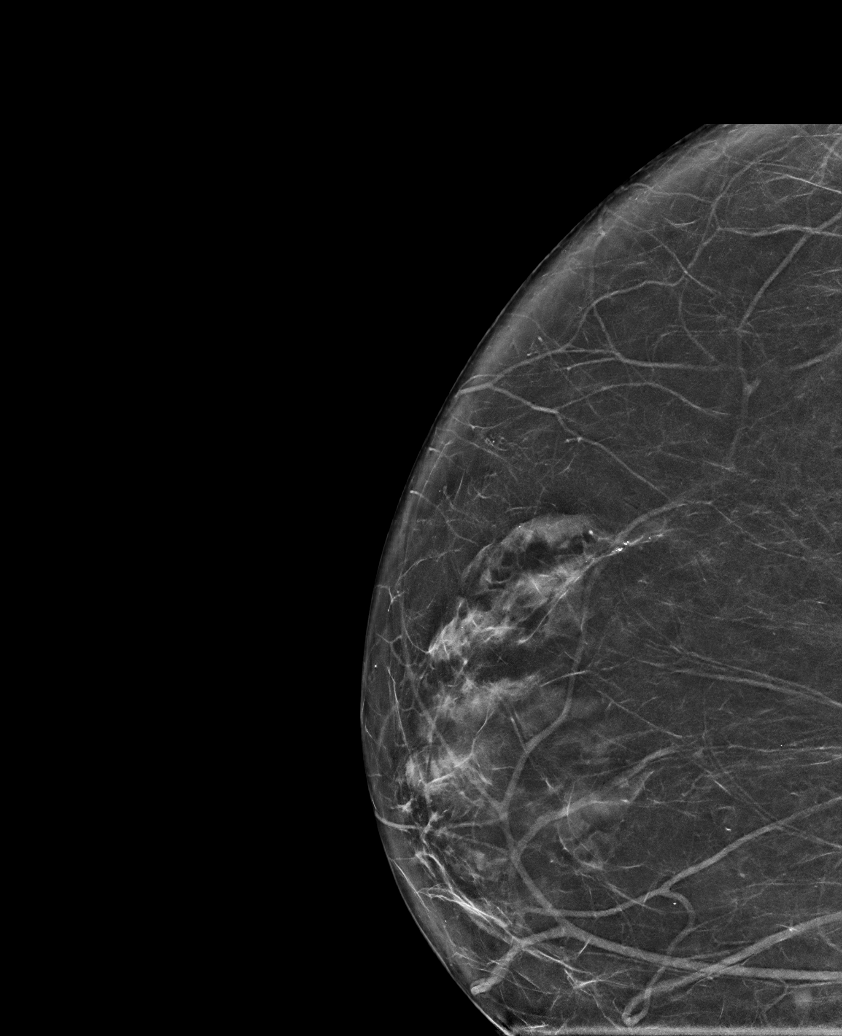

[R CV synth-2D]
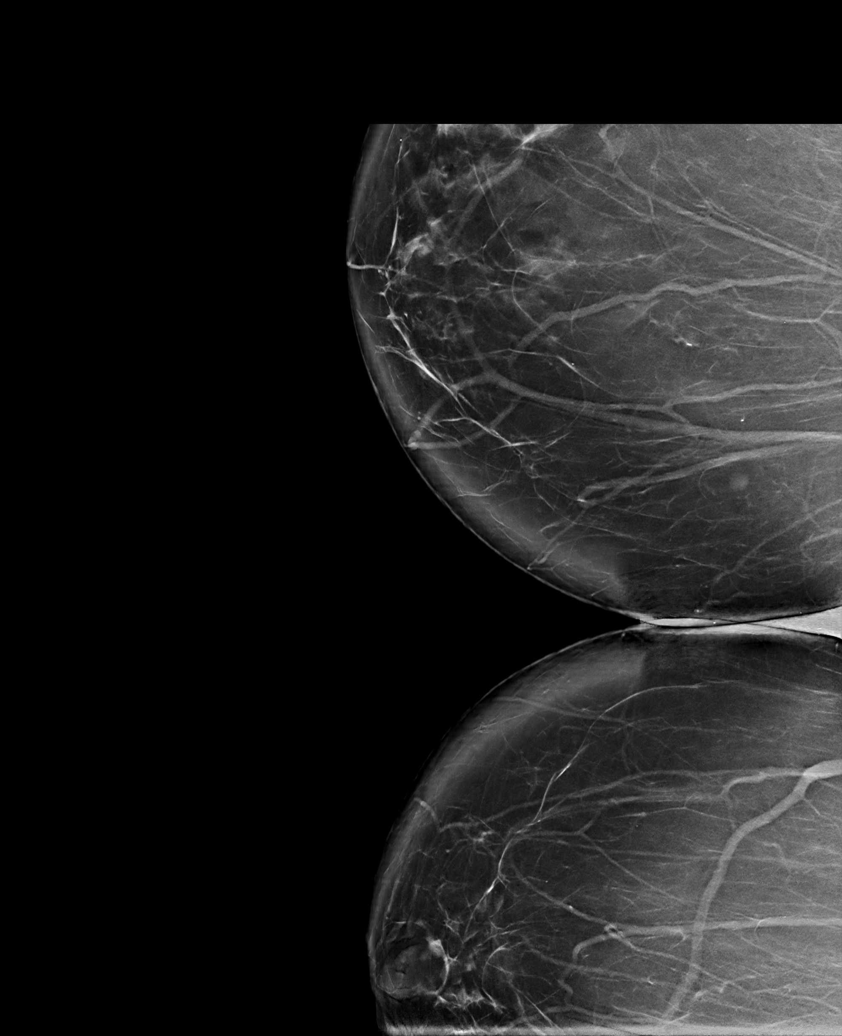

[L CC synth-2D]
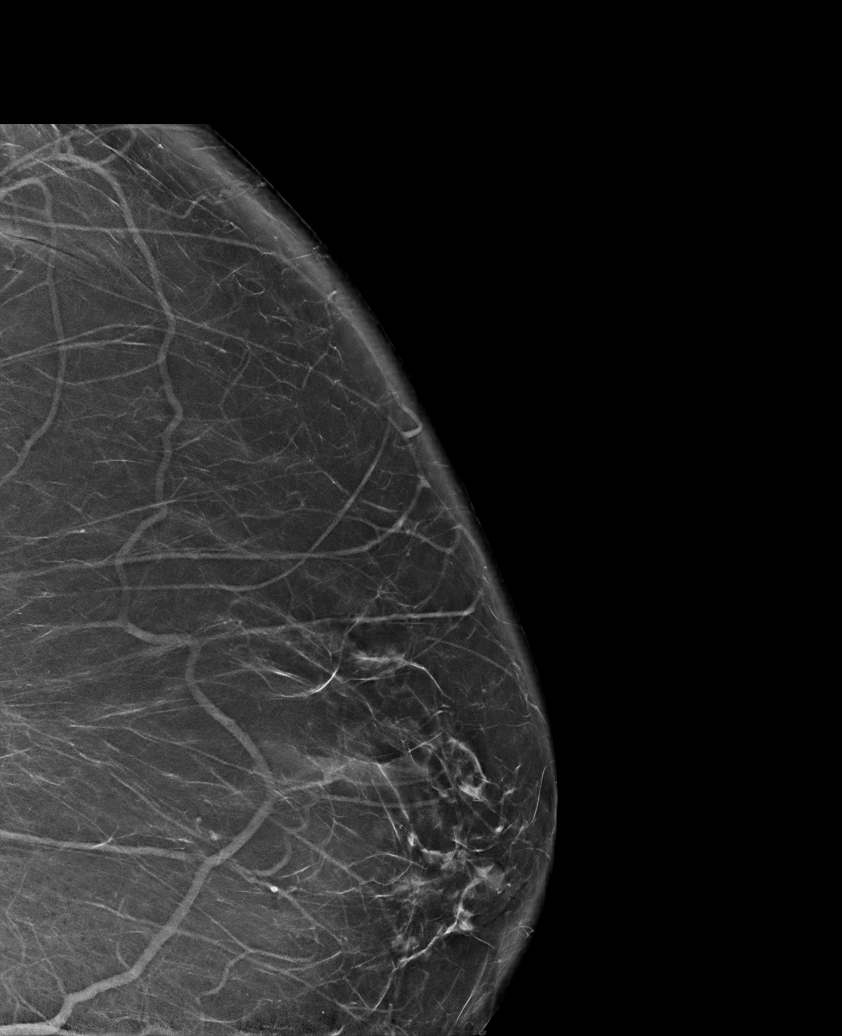

[R MLO synth-2D]
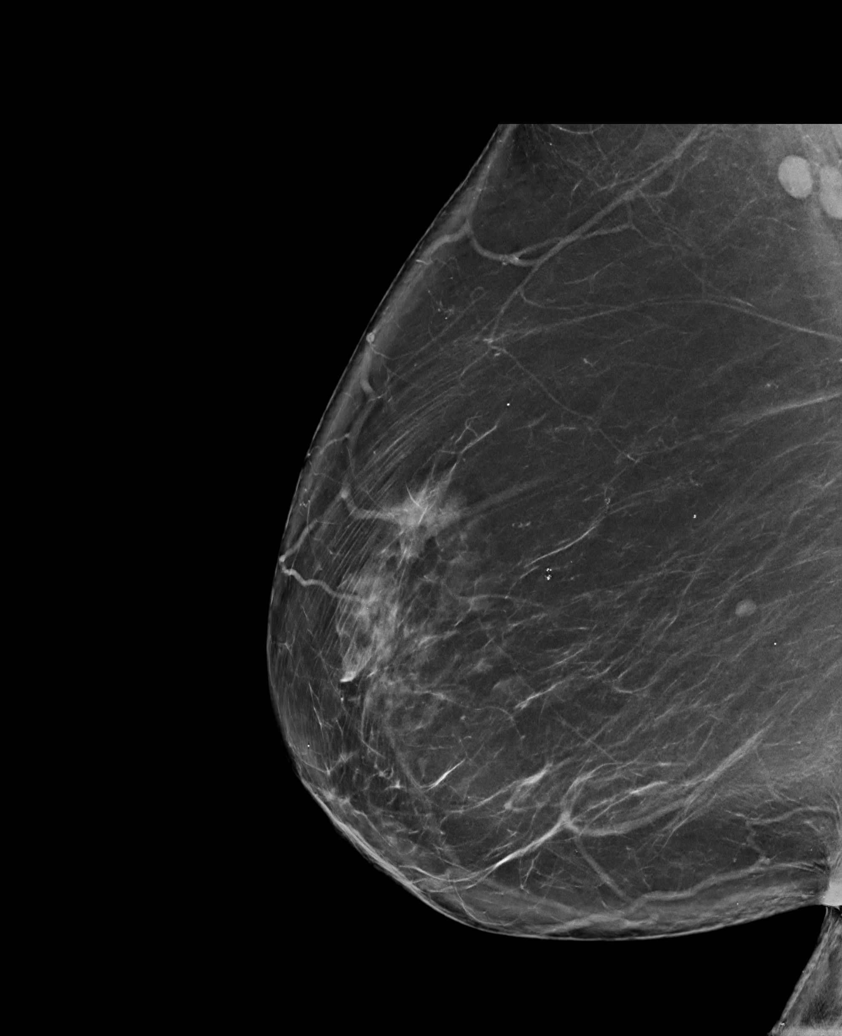

[L MLO synth-2D]
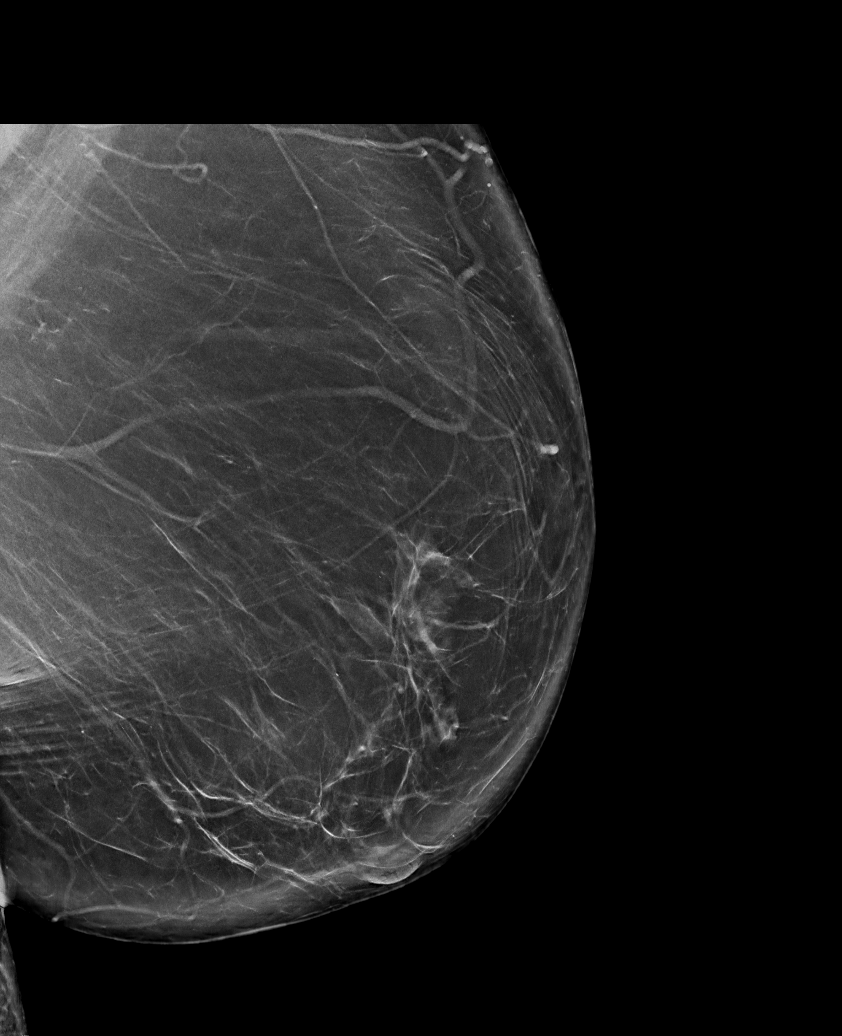

[L CC tomo · tomo slice 45/89.0]
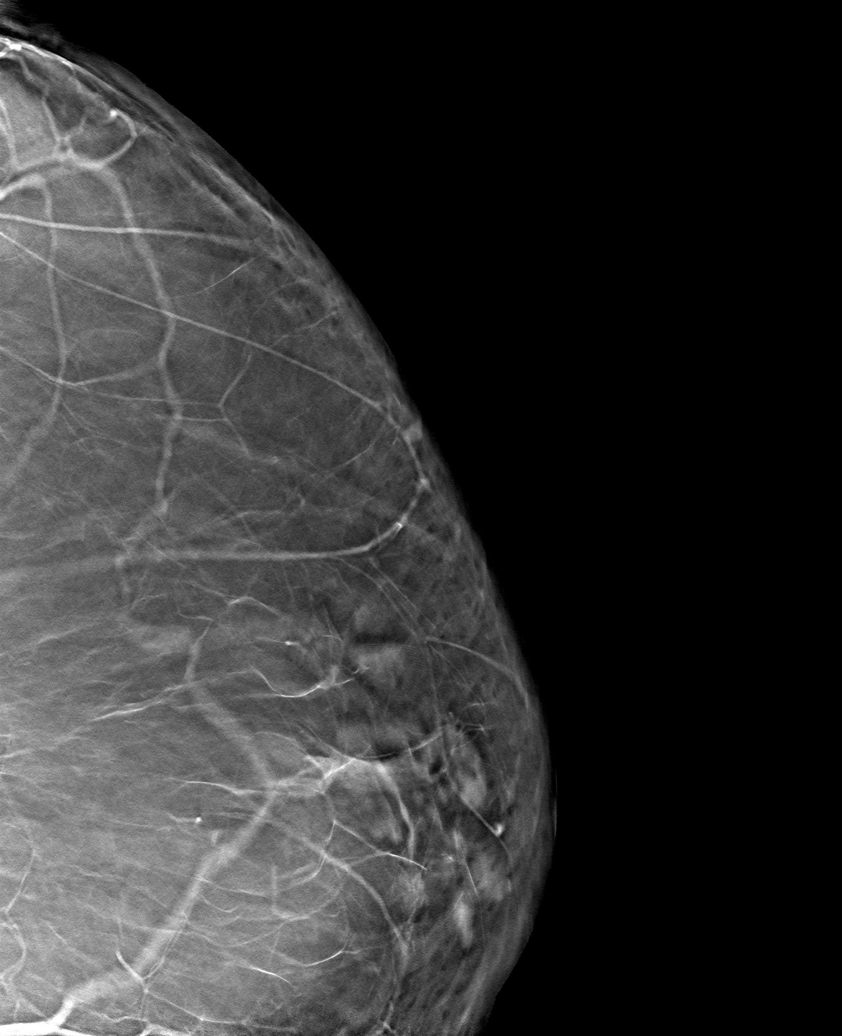

[6 of 30 positions shown; findings below may reference images not displayed]

ACR Breast Density Category b: There are scattered areas of
fibroglandular density.
FINDINGS: There are no findings suspicious for malignancy.
IMPRESSION: No mammographic evidence of malignancy. A result letter of this
screening mammogram will be mailed directly to the patient.

RECOMMENDATION:
Screening mammogram in one year. (Code:51-O-LD2)

BI-RADS CATEGORY  1: Negative.

## 2023-10-06 ENCOUNTER — Other Ambulatory Visit: Payer: Self-pay

## 2023-10-06 ENCOUNTER — Other Ambulatory Visit: Payer: Self-pay | Admitting: Internal Medicine

## 2023-10-06 DIAGNOSIS — N3941 Urge incontinence: Secondary | ICD-10-CM

## 2023-10-06 MED ORDER — SOLIFENACIN SUCCINATE 10 MG PO TABS
10.0000 mg | ORAL_TABLET | Freq: Every day | ORAL | 1 refills | Status: DC
Start: 2023-10-06 — End: 2024-02-10
  Filled 2023-10-06: qty 90, 90d supply, fill #0
  Filled 2024-01-05: qty 90, 90d supply, fill #1
  Filled 2024-01-05: qty 30, 30d supply, fill #1
  Filled 2024-01-29: qty 30, 30d supply, fill #2

## 2023-10-07 ENCOUNTER — Other Ambulatory Visit: Payer: Self-pay

## 2023-10-14 ENCOUNTER — Other Ambulatory Visit: Payer: Self-pay

## 2023-10-17 ENCOUNTER — Other Ambulatory Visit: Payer: Self-pay

## 2023-10-30 ENCOUNTER — Other Ambulatory Visit: Payer: Self-pay

## 2023-11-17 ENCOUNTER — Other Ambulatory Visit: Payer: Self-pay

## 2023-11-21 ENCOUNTER — Other Ambulatory Visit: Payer: Self-pay

## 2023-11-27 ENCOUNTER — Other Ambulatory Visit: Payer: Self-pay

## 2023-12-02 ENCOUNTER — Other Ambulatory Visit: Payer: Self-pay

## 2023-12-11 ENCOUNTER — Other Ambulatory Visit: Payer: Self-pay

## 2023-12-11 ENCOUNTER — Other Ambulatory Visit: Payer: Self-pay | Admitting: Cardiology

## 2023-12-11 ENCOUNTER — Other Ambulatory Visit: Payer: Self-pay | Admitting: Internal Medicine

## 2023-12-11 MED ORDER — METFORMIN HCL ER 500 MG PO TB24
500.0000 mg | ORAL_TABLET | Freq: Every day | ORAL | 0 refills | Status: DC
Start: 2023-12-11 — End: 2024-01-14
  Filled 2023-12-11: qty 30, 30d supply, fill #0

## 2023-12-12 ENCOUNTER — Other Ambulatory Visit: Payer: Self-pay

## 2023-12-12 MED ORDER — AMLODIPINE BESYLATE 5 MG PO TABS
5.0000 mg | ORAL_TABLET | Freq: Every day | ORAL | 0 refills | Status: DC
  Filled 2023-12-12: qty 90, 90d supply, fill #0

## 2023-12-15 ENCOUNTER — Ambulatory Visit: Payer: Self-pay | Admitting: Internal Medicine

## 2023-12-16 ENCOUNTER — Other Ambulatory Visit: Payer: Self-pay

## 2023-12-26 ENCOUNTER — Other Ambulatory Visit: Payer: Self-pay

## 2024-01-05 ENCOUNTER — Other Ambulatory Visit: Payer: Self-pay

## 2024-01-06 ENCOUNTER — Other Ambulatory Visit: Payer: Self-pay

## 2024-01-14 ENCOUNTER — Other Ambulatory Visit: Payer: Self-pay

## 2024-01-14 ENCOUNTER — Other Ambulatory Visit: Payer: Self-pay | Admitting: Internal Medicine

## 2024-01-14 MED ORDER — METFORMIN HCL ER 500 MG PO TB24
500.0000 mg | ORAL_TABLET | Freq: Every day | ORAL | 0 refills | Status: DC
Start: 2024-01-14 — End: 2024-02-10
  Filled 2024-01-14: qty 30, 30d supply, fill #0

## 2024-01-19 ENCOUNTER — Other Ambulatory Visit: Payer: Self-pay

## 2024-01-30 ENCOUNTER — Other Ambulatory Visit: Payer: Self-pay

## 2024-02-10 ENCOUNTER — Ambulatory Visit: Payer: Self-pay | Attending: Internal Medicine | Admitting: Internal Medicine

## 2024-02-10 ENCOUNTER — Other Ambulatory Visit: Payer: Self-pay

## 2024-02-10 DIAGNOSIS — Z1211 Encounter for screening for malignant neoplasm of colon: Secondary | ICD-10-CM

## 2024-02-10 DIAGNOSIS — Z79891 Long term (current) use of opiate analgesic: Secondary | ICD-10-CM

## 2024-02-10 DIAGNOSIS — M170 Bilateral primary osteoarthritis of knee: Secondary | ICD-10-CM | POA: Diagnosis not present

## 2024-02-10 DIAGNOSIS — Z6841 Body Mass Index (BMI) 40.0 and over, adult: Secondary | ICD-10-CM

## 2024-02-10 DIAGNOSIS — I10 Essential (primary) hypertension: Secondary | ICD-10-CM

## 2024-02-10 DIAGNOSIS — E1169 Type 2 diabetes mellitus with other specified complication: Secondary | ICD-10-CM | POA: Diagnosis not present

## 2024-02-10 DIAGNOSIS — Z7984 Long term (current) use of oral hypoglycemic drugs: Secondary | ICD-10-CM

## 2024-02-10 DIAGNOSIS — E1159 Type 2 diabetes mellitus with other circulatory complications: Secondary | ICD-10-CM

## 2024-02-10 DIAGNOSIS — Z1231 Encounter for screening mammogram for malignant neoplasm of breast: Secondary | ICD-10-CM

## 2024-02-10 DIAGNOSIS — N3941 Urge incontinence: Secondary | ICD-10-CM

## 2024-02-10 LAB — GLUCOSE, POCT (MANUAL RESULT ENTRY): POC Glucose: 89 mg/dL (ref 70–99)

## 2024-02-10 LAB — POCT GLYCOSYLATED HEMOGLOBIN (HGB A1C): HbA1c, POC (controlled diabetic range): 6.2 % (ref 0.0–7.0)

## 2024-02-10 MED ORDER — HYDRALAZINE HCL 100 MG PO TABS
100.0000 mg | ORAL_TABLET | Freq: Two times a day (BID) | ORAL | 3 refills | Status: AC
Start: 2024-02-10 — End: ?
  Filled 2024-02-10 – 2024-03-02 (×2): qty 180, 90d supply, fill #0
  Filled 2024-06-28: qty 180, 90d supply, fill #1
  Filled 2024-10-18: qty 180, 90d supply, fill #2

## 2024-02-10 MED ORDER — AMLODIPINE BESYLATE 5 MG PO TABS
5.0000 mg | ORAL_TABLET | Freq: Every day | ORAL | 1 refills | Status: AC
Start: 2024-02-10 — End: ?
  Filled 2024-02-10 – 2024-05-17 (×2): qty 90, 90d supply, fill #0
  Filled 2024-10-18: qty 90, 90d supply, fill #1

## 2024-02-10 MED ORDER — METFORMIN HCL ER 500 MG PO TB24
500.0000 mg | ORAL_TABLET | Freq: Every day | ORAL | 1 refills | Status: DC
Start: 2024-02-10 — End: 2024-10-15
  Filled 2024-02-10: qty 90, 90d supply, fill #0
  Filled 2024-05-17: qty 90, 90d supply, fill #1

## 2024-02-10 MED ORDER — TRAMADOL HCL 50 MG PO TABS
50.0000 mg | ORAL_TABLET | Freq: Two times a day (BID) | ORAL | 0 refills | Status: DC | PRN
Start: 2024-02-10 — End: 2024-03-02
  Filled 2024-02-10: qty 40, 20d supply, fill #0

## 2024-02-10 MED ORDER — LOSARTAN POTASSIUM-HCTZ 100-25 MG PO TABS
1.0000 | ORAL_TABLET | Freq: Every day | ORAL | 1 refills | Status: DC
Start: 2024-02-10 — End: 2024-06-14
  Filled 2024-02-10: qty 90, 90d supply, fill #0
  Filled 2024-02-27: qty 30, 30d supply, fill #0
  Filled 2024-03-30: qty 30, 30d supply, fill #1
  Filled 2024-05-03: qty 30, 30d supply, fill #2
  Filled 2024-06-01: qty 30, 30d supply, fill #3

## 2024-02-10 MED ORDER — SOLIFENACIN SUCCINATE 10 MG PO TABS
10.0000 mg | ORAL_TABLET | Freq: Every day | ORAL | 1 refills | Status: DC
Start: 2024-02-10 — End: 2024-06-14
  Filled 2024-02-10 – 2024-03-02 (×2): qty 90, 90d supply, fill #0
  Filled 2024-06-08: qty 90, 90d supply, fill #1

## 2024-02-10 NOTE — Progress Notes (Signed)
 Patient ID: Linda Cooper, female    DOB: [birth year]  MRN: [id number]  CC: Diabetes (DM f/u. Med refill. /No to pneumonia vax. Nurse visit for shingles vax)   Subjective: Linda Cooper is a 63 y.o. female who presents for chronic ds management. Her concerns today include:  Hx of HTN, DM, morbid obesity and OA knees,    HTN  Continue amlodipine 5 mg daily, hydralazine 100 mg 2 times a day, Hyzaar 100/25 mg daily.  Took morning meds already for today Tries to limit salt in foods Checks BP 1-2x/day: recent readings 144/77, 118/74, 147/89, 137/83, 115/69, 134/81  DM/Obesity: Results for orders placed or performed in visit on 02/10/24  POCT glucose (manual entry)   Collection Time: 02/10/24  3:29 PM  Result Value Ref Range   POC Glucose 89 70 - 99 mg/dl  POCT glycosylated hemoglobin (Hb A1C)   Collection Time: 02/10/24  4:06 PM  Result Value Ref Range   Hemoglobin A1C     HbA1c POC (<> result, manual entry)     HbA1c, POC (prediabetic range)     HbA1c, POC (controlled diabetic range) 6.2 0.0 - 7.0 %  Taking Metformin 500 mg daily. Eating habits have changed some because of limited finances. Hrs at work cut so she purchases and eats what she can afford like deli meat Wgh has remained stable  OA: reports knees have been acting up this winter LT>RT.  Can tell when it is going to rain. Hard to walk up steps.  Using Aleve 2 tabs in a.m, Biofreeze and tapes the knees daily.  Feels like the Aleve and Biofreeze are not cutting it anymore in terms of pain control.  Had inj to knees last yr.  Plans to call and make appt with Dr. August Saucer when she gets Medicare in May of this year.  Request refill on Vesicare.  HM:  defers on PCV and shingles vaccine.  Yes to MMG  Patient Active Problem List   Diagnosis Date Noted   Hypertension associated with diabetes (HCC) 08/13/2023   Type 2 diabetes mellitus with morbid obesity (HCC) 08/13/2023   Iron deficiency anemia 08/13/2023   Urge  incontinence 08/31/2021   Pain, dental 08/16/2021   Trigger middle finger of left hand 11/08/2019   Carpal tunnel syndrome of left wrist 07/09/2019   Morbid obesity with body mass index (BMI) of 50.0 to 59.9 in adult [facility]) 10/16/2018   Morbid obesity (HCC) 11/11/2014   PCOS (polycystic ovarian syndrome) 11/10/2014   Degenerative arthritis of knee, bilateral 11/10/2014     Current Outpatient Medications on File Prior to Visit  Medication Sig Dispense Refill   albuterol (VENTOLIN HFA) 108 (90 Base) MCG/ACT inhaler Inhale 2 puffs into the lungs every 4 (four) hours as needed for wheezing. 6.7 g 1   Blood Glucose Monitoring Suppl (BLOOD GLUCOSE MONITOR SYSTEM) w/Device KIT use daily as directed. 1 kit 0   glucose blood (TRUE METRIX BLOOD GLUCOSE TEST) test strip Use to check blood sugar once daily. 100 each 6   Multiple Vitamins-Minerals (MULTIVITAMIN WITH MINERALS) tablet Take 1 tablet by mouth daily.     NON FORMULARY SEGA PRO BLADDER HEALTH 100 MG DAILY     TRUEplus Lancets 28G MISC Use to check blood sugar once daily. 100 each 6   No current facility-administered medications on file prior to visit.    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    Son is 69.       She is motivated to lose weight and improve health by the expected  death of her father  Dec 28, 2016) and the sudden deaths of her cousin (found dead in car one month after death of his wife 02-25-17)  and his wife (died after childbirth 01/28/2017)    Exercise: minimal    Social Drivers of Health   Financial Resource Strain: Medium Risk (02/10/2024)   Overall Financial Resource Strain (CARDIA)    Difficulty of Paying Living Expenses: Somewhat hard  Food Insecurity: No Food Insecurity (02/10/2024)   Hunger Vital Sign    Worried About Running Out of Food in the Last Year: Never true    Ran Out of Food in the Last Year: Never true  Transportation Needs: No Transportation Needs (02/10/2024)   PRAPARE - Administrator, Civil Service (Medical): No    Lack of Transportation (Non-Medical): No  Physical Activity: Inactive (02/10/2024)   Exercise Vital Sign    Days of Exercise per Week: 0 days    Minutes of Exercise per Session: 0 min  Stress: No Stress Concern Present (02/10/2024)   [employer] of Occupational Health - Occupational Stress Questionnaire    Feeling of Stress : Only a little  Social Connections: Moderately Integrated (02/10/2024)   Social Connection and Isolation Panel [NHANES]    Frequency of Communication with Friends and Family: Three times a week    Frequency of Social Gatherings with Friends and Family: Three times a week    Attends Religious Services: More than 4 times per year    Active Member of Clubs or Organizations: Yes    Attends Banker Meetings: More than 4 times per year    Marital Status: Never married  Intimate Partner Violence: Not At Risk (02/10/2024)   Humiliation, Afraid, Rape, and Kick questionnaire    Fear of Current or Ex-Partner: No    Emotionally Abused: No    Physically Abused: No    Sexually Abused: No    Family History  Problem Relation Age of Onset   Hypertension Mother    Diabetes Mother    Stroke Mother    Heart disease Mother    Obesity Mother    Hypertension Father    Diabetes Father     Stroke Father    Obesity Father    Heart disease Father    Hyperlipidemia Father    Heart disease Sister    Diabetes Brother    Hypertension Brother    Gout Brother    Breast cancer Cousin    Cancer Neg Hx     Past Surgical History:  Procedure Laterality Date   ABDOMINAL HYSTERECTOMY  Feb 25, 2005  HERNIA REPAIR  2006    Umbilical     ROS: Review of Systems Negative except as stated above  PHYSICAL EXAM: BP 135/82 (BP Location: Left Arm, Patient Position: Sitting, Cuff Size: Large)   Pulse 83   Temp 97.7 F (36.5 C) (Oral)   Ht 5\' 2"  (1.575 m)   Wt 288 lb (130.6 kg)   SpO2 100%   BMI 52.68 kg/m   Wt Readings from Last 3 Encounters:  02/10/24 288 lb (130.6 kg)  08/12/23 289 lb (131.1 kg)  02/12/23 289 lb 6.4 oz (131.3 kg)  Repeat blood pressure 148/86  Physical Exam  General appearance - alert, well appearing, and in no distress Mental status - normal mood, behavior, speech, dress, motor activity, and thought processes Neck - supple, no significant adenopathy Chest - clear to auscultation, no wheezes, rales or rhonchi, symmetric air entry Heart - normal rate, regular rhythm, normal S1, S2, no murmurs, rubs, clicks or gallops Extremities -trace to 1+ lower extremity edema. MSK: Knees -large body habitus.  She has tapes around the knees.      Latest Ref Rng & Units 08/12/2023    4:56 PM 01/10/2023    3:51 PM 09/06/2022    3:59 PM  CMP  Glucose 70 - 99 mg/dL  93    BUN 8 - 27 mg/dL  31    Creatinine 2.95 - 1.00 mg/dL  6.21    Sodium 308 - 657 mmol/L  140    Potassium 3.5 - 5.2 mmol/L  4.1    Chloride 96 - 106 mmol/L  100    CO2 20 - 29 mmol/L  26    Calcium 8.7 - 10.3 mg/dL  9.4    Total Protein 6.0 - 8.5 g/dL 6.5   6.7   Total Bilirubin 0.0 - 1.2 mg/dL 0.6   0.6   Alkaline Phos 44 - 121 IU/L 80   93   AST 0 - 40 IU/L 18   20   ALT 0 - 32 IU/L 15   17    Lipid Panel     Component Value Date/Time   CHOL 140 08/12/2023 1656   TRIG 109 08/12/2023 1656   HDL 71  08/12/2023 1656   CHOLHDL 2.0 08/12/2023 1656   CHOLHDL 2.0 11/10/2014 1148   VLDL 21 11/10/2014 1148   LDLCALC 50 08/12/2023 1656    CBC    Component Value Date/Time   WBC 6.3 08/12/2023 1656   WBC 7.5 06/24/2022 1015   RBC 4.64 08/12/2023 1656   RBC 4.63 06/24/2022 1015   HGB 11.7 08/12/2023 1656   HCT 37.5 08/12/2023 1656   PLT 364 08/12/2023 1656   MCV 81 08/12/2023 1656   MCH 25.2 (L) 08/12/2023 1656   MCH 25.5 (L) 06/24/2022 1015   MCHC 31.2 (L) 08/12/2023 1656   MCHC 32.2 06/24/2022 1015   RDW 16.0 (H) 08/12/2023 1656   LYMPHSABS 2.5 06/24/2022 1015   LYMPHSABS 3.0 12/29/2018 1546   MONOABS 0.7 06/24/2022 1015   EOSABS 0.2 06/24/2022 1015   EOSABS 0.4 12/29/2018 1546   BASOSABS 0.1 06/24/2022 1015   BASOSABS 0.1 12/29/2018 1546    ASSESSMENT AND PLAN: 1. Type 2 diabetes mellitus with morbid obesity (HCC) (Primary) At goal.  Continue metformin.  Encourage healthy eating habits as her budget allows. - POCT glycosylated hemoglobin (Hb A1C) - POCT glucose (manual entry) - metFORMIN (GLUCOPHAGE-XR) 500 MG 24 hr tablet; Take 1 tablet (500 mg total) by mouth daily with breakfast.  Dispense: 90 tablet; Refill: 1  2. Hypertension associated with type 2 diabetes mellitus (HCC) Home BP readings have been fluctuating.   Advised to stop daily Aleve. Continue trying to limit salt in foods Continue current meds - amLODipine (NORVASC) 5 MG tablet; Take 1 tablet (5 mg total) by mouth daily.  Dispense: 90 tablet; Refill: 1 - hydrALAZINE (APRESOLINE) 100 MG tablet; Take 1 tablet (100 mg total) by mouth 2 (two) times daily.  Dispense: 180 tablet; Refill: 3 - losartan-hydrochlorothiazide (HYZAAR) 100-25 MG tablet; TAKE 1 TABLET BY MOUTH DAILY.  Dispense: 90 tablet; Refill: 1 - Basic Metabolic Panel  3. Primary osteoarthritis of both knees Discussed stopping the Aleve and trying her with tramadol.  Informed patient that it is a controlled substance and can be habit-forming.  Can  cause drowsiness and constipation.  She is agreeable to trying the medication.  I went over controlled substance prescribing agreement with her.  She is agreeable to random urine drug screens.  [place] controlled substance reporting system reviewed. - traMADol (ULTRAM) 50 MG tablet; Take 1 tablet (50 mg total) by mouth every 12 (twelve) hours as needed.  Dispense: 40 tablet; Refill: 0  4. Urge incontinence Refill given on Vesicare - solifenacin (VESICARE) 10 MG tablet; Take 1 tablet (10 mg total) by mouth daily.  Dispense: 90 tablet; Refill: 1  5. Encounter for screening mammogram for malignant neoplasm of breast - MM Digital Screening; Future  6. Screening for colon cancer - Fecal occult blood, imunochemical([facility]/Sunquest)  7. Opioid use agreement exists - traMADol (ULTRAM) 50 MG tablet; Take 1 tablet (50 mg total) by mouth every 12 (twelve) hours as needed.  Dispense: 40 tablet; Refill: 0 - [id number] 11+Oxyco+Alc+Crt-Bund     Patient was given the opportunity to ask questions.  Patient verbalized understanding of the plan and was able to repeat key elements of the plan.   This documentation was completed using Paediatric nurse.  Any transcriptional errors are unintentional.  Orders Placed This Encounter  Procedures   Fecal occult blood, imunochemical([facility]corp/Sunquest)   MM Digital Screening   Basic Metabolic Panel   [id number] 11+Oxyco+Alc+Crt-Bund   POCT glycosylated hemoglobin (Hb A1C)   POCT glucose (manual entry)     Requested Prescriptions   Signed Prescriptions Disp Refills   amLODipine (NORVASC) 5 MG tablet 90 tablet 1    Sig: Take 1 tablet (5 mg total) by mouth daily.   hydrALAZINE (APRESOLINE) 100 MG tablet 180 tablet 3    Sig: Take 1 tablet (100 mg total) by mouth 2 (two) times daily.   losartan-hydrochlorothiazide (HYZAAR) 100-25 MG tablet 90 tablet 1    Sig: TAKE 1 TABLET BY MOUTH DAILY.   metFORMIN (GLUCOPHAGE-XR) 500 MG 24 hr tablet 90  tablet 1    Sig: Take 1 tablet (500 mg total) by mouth daily with breakfast.   solifenacin (VESICARE) 10 MG tablet 90 tablet 1    Sig: Take 1 tablet (10 mg total) by mouth daily.   traMADol (ULTRAM) 50 MG tablet 40 tablet 0    Sig: Take 1 tablet (50 mg total) by mouth every 12 (twelve) hours as needed.    Return in about 4 months (around 06/11/2024).  Jonah Blue, MD, FACP

## 2024-02-11 ENCOUNTER — Encounter: Payer: Self-pay | Admitting: Internal Medicine

## 2024-02-11 LAB — DRUG SCREEN 764883 11+OXYCO+ALC+CRT-BUND
Amphetamines, Urine: NEGATIVE ng/mL
BENZODIAZ UR QL: NEGATIVE ng/mL
Barbiturate: NEGATIVE ng/mL
Cannabinoid Quant, Ur: NEGATIVE ng/mL
Cocaine (Metabolite): NEGATIVE ng/mL
Creatinine: 100.3 mg/dL (ref 20.0–300.0)
Ethanol: NEGATIVE %
Meperidine: NEGATIVE ng/mL
Methadone Screen, Urine: NEGATIVE ng/mL
OPIATE SCREEN URINE: NEGATIVE ng/mL
Oxycodone/Oxymorphone, Urine: NEGATIVE ng/mL
Phencyclidine: NEGATIVE ng/mL
Propoxyphene: NEGATIVE ng/mL
Tramadol: NEGATIVE ng/mL
pH, Urine: 5.3 (ref 4.5–8.9)

## 2024-02-11 LAB — BASIC METABOLIC PANEL
BUN/Creatinine Ratio: 24 (ref 12–28)
BUN: 22 mg/dL (ref 8–27)
CO2: 24 mmol/L (ref 20–29)
Calcium: 9.6 mg/dL (ref 8.7–10.3)
Chloride: 98 mmol/L (ref 96–106)
Creatinine, Ser: 0.9 mg/dL (ref 0.57–1.00)
Glucose: 96 mg/dL (ref 70–99)
Potassium: 4.4 mmol/L (ref 3.5–5.2)
Sodium: 139 mmol/L (ref 134–144)
eGFR: 71 mL/min/{1.73_m2} (ref >59)

## 2024-02-27 ENCOUNTER — Other Ambulatory Visit: Payer: Self-pay

## 2024-03-02 ENCOUNTER — Other Ambulatory Visit: Payer: Self-pay

## 2024-03-02 ENCOUNTER — Other Ambulatory Visit: Payer: Self-pay | Admitting: Internal Medicine

## 2024-03-02 DIAGNOSIS — M170 Bilateral primary osteoarthritis of knee: Secondary | ICD-10-CM

## 2024-03-02 DIAGNOSIS — Z79891 Long term (current) use of opiate analgesic: Secondary | ICD-10-CM

## 2024-03-02 NOTE — Telephone Encounter (Signed)
 Requested medication (s) are due for refill today - unsure  Requested medication (s) are on the active medication list -yes  Future visit scheduled -yes  Last refill: 02/10/24 #40  Notes to clinic: non delegated Rx  Requested Prescriptions  Pending Prescriptions Disp Refills   traMADol (ULTRAM) 50 MG tablet 40 tablet 0    Sig: Take 1 tablet (50 mg total) by mouth every 12 (twelve) hours as needed.     Not Delegated - Analgesics:  Opioid Agonists Failed - 03/02/2024  4:04 PM      Failed - This refill cannot be delegated      Passed - Urine Drug Screen completed in last 360 days      Passed - Valid encounter within last 3 months    Recent Outpatient Visits           3 weeks ago Type 2 diabetes mellitus with morbid obesity (HCC)   [place] [facility] Dept Of [place]. [facility] Jonah Blue B, MD   6 months ago Type 2 diabetes mellitus with morbid obesity [facility])   [place] [facility] Dept Of [place]. [facility] Jonah Blue B, MD   1 year ago Type 2 diabetes mellitus with morbid obesity [facility])   [place] [facility] - A Dept Of [place]. [facility] Jonah Blue B, MD   1 year ago Type 2 diabetes mellitus with morbid obesity [facility])   [place] [facility] - A Dept Of [place]. [facility] Lois Huxley, Brimson L, RPH-CPP   1 year ago Type 2 diabetes mellitus with morbid obesity (HCC)   [place] [facility] - A Dept Of [place]. [facility] Marcine Matar, MD       Future Appointments             In 3 months Laural Benes Binnie Rail, MD [facility] - A Dept Of [place] Bridegroom. [facility]               Requested Prescriptions  Pending Prescriptions Disp Refills   traMADol (ULTRAM) 50 MG tablet 40 tablet 0    Sig: Take 1 tablet (50 mg total) by mouth every 12 (twelve) hours as needed.     Not Delegated - Analgesics:  Opioid  Agonists Failed - 03/02/2024  4:04 PM      Failed - This refill cannot be delegated      Passed - Urine Drug Screen completed in last 360 days      Passed - Valid encounter within last 3 months    Recent Outpatient Visits           3 weeks ago Type 2 diabetes mellitus with morbid obesity (HCC)   [place] [facility] Dept Of [place]. [facility] Jonah Blue B, MD   6 months ago Type 2 diabetes mellitus with morbid obesity [facility])   [place] [facility] - A Dept Of [place]. [facility] Jonah Blue B, MD   1 year ago Type 2 diabetes mellitus with morbid obesity [facility])   [place] [facility] - A Dept Of [place]. [facility] Jonah Blue B, MD   1 year ago Type 2 diabetes mellitus with morbid obesity [facility])    [facility] - A Dept Of [place]. [facility], Jeannett Senior L, RPH-CPP   1 year ago Type 2 diabetes mellitus with morbid obesity (HCC)   [place] [facility] - A Dept Of [place]. [facility] Marcine Matar, MD       Future Appointments             In 3 months Laural Benes Binnie Rail, MD [facility] - A Dept Of [place] Bridegroom. [facility]

## 2024-03-03 ENCOUNTER — Other Ambulatory Visit: Payer: Self-pay

## 2024-03-03 MED ORDER — TRAMADOL HCL 50 MG PO TABS
50.0000 mg | ORAL_TABLET | Freq: Two times a day (BID) | ORAL | 1 refills | Status: DC | PRN
Start: 2024-03-03 — End: 2024-05-21
  Filled 2024-03-03: qty 60, 30d supply, fill #0
  Filled 2024-04-13: qty 60, 30d supply, fill #1

## 2024-03-05 ENCOUNTER — Emergency Department (HOSPITAL_COMMUNITY)

## 2024-03-05 ENCOUNTER — Emergency Department (HOSPITAL_COMMUNITY)
Admission: EM | Admit: 2024-03-05 | Discharge: 2024-03-05 | Disposition: A | Discharge status: Home / Self Care | Attending: Emergency Medicine | Admitting: Emergency Medicine

## 2024-03-05 ENCOUNTER — Other Ambulatory Visit: Payer: Self-pay

## 2024-03-05 ENCOUNTER — Ambulatory Visit: Payer: Self-pay

## 2024-03-05 ENCOUNTER — Encounter (HOSPITAL_COMMUNITY): Payer: Self-pay

## 2024-03-05 DIAGNOSIS — Z7984 Long term (current) use of oral hypoglycemic drugs: Secondary | ICD-10-CM | POA: Insufficient documentation

## 2024-03-05 DIAGNOSIS — Y990 Civilian activity done for income or pay: Secondary | ICD-10-CM | POA: Diagnosis not present

## 2024-03-05 DIAGNOSIS — J45909 Unspecified asthma, uncomplicated: Secondary | ICD-10-CM | POA: Diagnosis not present

## 2024-03-05 DIAGNOSIS — M542 Cervicalgia: Secondary | ICD-10-CM | POA: Diagnosis not present

## 2024-03-05 DIAGNOSIS — I10 Essential (primary) hypertension: Secondary | ICD-10-CM | POA: Diagnosis not present

## 2024-03-05 DIAGNOSIS — W19XXXA Unspecified fall, initial encounter: Secondary | ICD-10-CM | POA: Insufficient documentation

## 2024-03-05 DIAGNOSIS — E119 Type 2 diabetes mellitus without complications: Secondary | ICD-10-CM | POA: Diagnosis not present

## 2024-03-05 DIAGNOSIS — Z79899 Other long term (current) drug therapy: Secondary | ICD-10-CM | POA: Diagnosis not present

## 2024-03-05 DIAGNOSIS — S0990XA Unspecified injury of head, initial encounter: Secondary | ICD-10-CM | POA: Insufficient documentation

## 2024-03-05 DIAGNOSIS — R42 Dizziness and giddiness: Secondary | ICD-10-CM

## 2024-03-05 LAB — I-STAT CHEM 8, ED
BUN: 19 mg/dL (ref 8–23)
Calcium, Ion: 1.17 mmol/L (ref 1.15–1.40)
Chloride: 101 mmol/L (ref 98–111)
Creatinine, Ser: 0.9 mg/dL (ref 0.44–1.00)
Glucose, Bld: 100 mg/dL — ABNORMAL HIGH (ref 70–99)
HCT: 38 % (ref 36.0–46.0)
Hemoglobin: 12.9 g/dL (ref 12.0–15.0)
Potassium: 4.2 mmol/L (ref 3.5–5.1)
Sodium: 137 mmol/L (ref 135–145)
TCO2: 20 mmol/L — ABNORMAL LOW (ref 22–32)

## 2024-03-05 LAB — CBC
HCT: 35.9 % — ABNORMAL LOW (ref 36.0–46.0)
Hemoglobin: 11.5 g/dL — ABNORMAL LOW (ref 12.0–15.0)
MCH: 25.8 pg — ABNORMAL LOW (ref 26.0–34.0)
MCHC: 32 g/dL (ref 30.0–36.0)
MCV: 80.7 fL (ref 80.0–100.0)
Platelets: 373 10*3/uL (ref 150–400)
RBC: 4.45 MIL/uL (ref 3.87–5.11)
RDW: 15.6 % — ABNORMAL HIGH (ref 11.5–15.5)
WBC: 5.9 10*3/uL (ref 4.0–10.5)
nRBC: 0 % (ref 0.0–0.2)

## 2024-03-05 MED ORDER — ONDANSETRON HCL 4 MG PO TABS
4.0000 mg | ORAL_TABLET | Freq: Four times a day (QID) | ORAL | 0 refills | Status: DC
  Filled 2024-03-05: qty 12, 3d supply, fill #0

## 2024-03-05 MED ORDER — MECLIZINE HCL 25 MG PO TABS
25.0000 mg | ORAL_TABLET | Freq: Three times a day (TID) | ORAL | 0 refills | Status: DC | PRN
  Filled 2024-03-05: qty 30, 10d supply, fill #0

## 2024-03-05 MED ORDER — MECLIZINE HCL 25 MG PO TABS
25.0000 mg | ORAL_TABLET | Freq: Once | ORAL | Status: AC
  Administered 2024-03-05: 25 mg via ORAL
  Filled 2024-03-05: qty 1

## 2024-03-05 MED ORDER — GADOBUTROL 1 MMOL/ML IV SOLN
10.0000 mL | Freq: Once | INTRAVENOUS | Status: AC | PRN
  Administered 2024-03-05: 10 mL via INTRAVENOUS

## 2024-03-05 MED ORDER — MECLIZINE HCL 25 MG PO TABS: 25.0000 mg | ORAL_TABLET | Freq: Three times a day (TID) | ORAL | 0 refills | Status: DC | PRN

## 2024-03-05 MED ORDER — ONDANSETRON HCL 4 MG PO TABS: 4.0000 mg | ORAL_TABLET | Freq: Four times a day (QID) | ORAL | 0 refills | Status: DC

## 2024-03-05 MED ORDER — IOHEXOL 350 MG/ML SOLN
80.0000 mL | Freq: Once | INTRAVENOUS | Status: AC | PRN
  Administered 2024-03-05: 80 mL via INTRAVENOUS

## 2024-03-05 MED ORDER — DIAZEPAM 5 MG PO TABS: 5.0000 mg | ORAL_TABLET | Freq: Two times a day (BID) | ORAL | 0 refills | Status: AC

## 2024-03-05 MED ORDER — DIAZEPAM 5 MG PO TABS
5.0000 mg | ORAL_TABLET | Freq: Two times a day (BID) | ORAL | 0 refills | Status: DC
  Filled 2024-03-05: qty 10, 5d supply, fill #0

## 2024-03-05 MED ORDER — PROCHLORPERAZINE EDISYLATE 10 MG/2ML IJ SOLN
5.0000 mg | Freq: Once | INTRAMUSCULAR | Status: AC
  Administered 2024-03-05: 5 mg via INTRAVENOUS
  Filled 2024-03-05: qty 2

## 2024-03-05 MED ORDER — DIAZEPAM 5 MG/ML IJ SOLN
2.5000 mg | Freq: Once | INTRAMUSCULAR | Status: AC
  Administered 2024-03-05: 2.5 mg via INTRAVENOUS
  Filled 2024-03-05: qty 2

## 2024-03-05 NOTE — Discharge Instructions (Addendum)
 Report for any problem.

## 2024-03-05 NOTE — ED Provider Notes (Signed)
 Patient seen after prior EDP.  Symptoms improved. MRI Brain without acute abnormality.  Importance of close FU stressed - Strict return precautions given and understood.    Wynetta Fines, MD 03/05/24 [phone]

## 2024-03-05 NOTE — ED Notes (Signed)
 Patient assisted to bedside commode changed linen and clean gown and new brief.

## 2024-03-05 NOTE — ED Notes (Signed)
Assist patient to bed side commode.

## 2024-03-05 NOTE — ED Notes (Signed)
 Dr. Rubin Payor requested to bedside due to patient not feeling right.

## 2024-03-05 NOTE — Telephone Encounter (Signed)
 Copied from CRM [id number]. Topic: Clinical - Red Word Triage >> Mar 05, 2024  9:24 AM Nyra Capes wrote: Red Word that prompted transfer to Nurse Triage: patient calling in, [place] on Wednesday 03/03/24 hit her head and has a bump on the back lower left hand side of head. Starting this morning dizzy when sitting and standing and off balance  Chief Complaint: dizziness when sitting, standing, off balance Symptoms: fell Wed. Hit back of head Frequency: Wed Pertinent Negatives: Patient denies no LOC, vision or speech or hearing deficits Disposition: [x] ED /[] Urgent Care (no appt availability in office) / [] Appointment(In office/virtual)/ []  [place] [facility]/ [] Home Care/ [] Refused Recommended Disposition /[] [place] [facility]/ []  Follow-up with PCP Additional Notes:   Reason for Disposition  [1] ACUTE NEURO SYMPTOM AND [2] now fine  (DEFINITION: difficult to awaken OR confused thinking and talking OR slurred speech OR weakness of arms OR unsteady walking)  Answer Assessment - Initial Assessment Questions 1. MECHANISM: "How did the injury happen?" For falls, ask: "What height did you fall from?" and "What surface did you fall against?"      Sat in chair leaned forward  and the chair went backwards. Landed fell backwards and hit head 2. ONSET: "When did the injury happen?" (Minutes or hours ago)      Wed 3. NEUROLOGIC SYMPTOMS: "Was there any loss of consciousness?" "Are there any other neurological symptoms?"      no 4. MENTAL STATUS: "Does the person know who they are, who you are, and where they are?"      yes 5. LOCATION: "What part of the head was hit?"      Left lower back of head  6. SCALP APPEARANCE: "What does the scalp look like? Is it bleeding now?" If Yes, ask: "Is it difficult to stop?"      *No Answer* 7. SIZE: For cuts, bruises, or swelling, ask: "How large is it?" (e.g., inches or centimeters)      Swelling finger pad tender 8. PAIN: "Is there any pain?" If Yes, ask: "How  bad is it?"  (e.g., Scale 1-10; or mild, moderate, severe)     Mild 9. TETANUS: For any breaks in the skin, ask: "When was the last tetanus booster?"     N/a 10. OTHER SYMPTOMS: "Do you have any other symptoms?" (e.g., neck pain, vomiting)       Dizziness, off balance  Protocols used: Head Injury-A-AH

## 2024-03-05 NOTE — ED Provider Notes (Signed)
  EMERGENCY DEPARTMENT AT [facility] Provider Note   CSN: [id number] Arrival date & time: 03/05/24  1103     History  Chief Complaint  Patient presents with   Marletta Lor    Linda Cooper is a 63 y.o. female.   Fall  Patient presents after fall.  2 days ago was at work sitting in a chair and the chair moved and she fell backwards on her parent and fell back hitting her head.  Some difficulty standing at the time but no dizziness.  Has had some aching all over but now some dizziness.  Pain on the top of the neck/lower skull on the back.  Feels more dizzy.  States she has had vertigo before.  States it feels like things are moving.  Does not feel that she is get a pass out.  No vision changes.  No numbness or weakness.    Past Medical History:  Diagnosis Date   'Light-for-dates' infant with signs of fetal malnutrition    Anemia    Arthritis    Asthma    Diabetes mellitus without complication (HCC) Dx 2007   GERD (gastroesophageal reflux disease)    Hypertension Dx 1990   Knee pain, bilateral    Obesity    Rheumatic fever childhood    has intermittent murmur and hand arthritis     Home Medications Prior to Admission medications   Medication Sig Start Date End Date Taking? Authorizing Provider  albuterol (VENTOLIN HFA) 108 (90 Base) MCG/ACT inhaler Inhale 2 puffs into the lungs every 4 (four) hours as needed for wheezing. 07/24/22   Anders Simmonds, PA-C  amLODipine (NORVASC) 5 MG tablet Take 1 tablet (5 mg total) by mouth daily. 02/10/24   Marcine Matar, MD  Blood Glucose Monitoring Suppl (BLOOD GLUCOSE MONITOR SYSTEM) w/Device KIT use daily as directed. 08/12/23   Marcine Matar, MD  glucose blood (TRUE METRIX BLOOD GLUCOSE TEST) test strip Use to check blood sugar once daily. 08/13/23   Marcine Matar, MD  hydrALAZINE (APRESOLINE) 100 MG tablet Take 1 tablet (100 mg total) by mouth 2 (two) times daily. 02/10/24   Marcine Matar, MD   losartan-hydrochlorothiazide (HYZAAR) 100-25 MG tablet TAKE 1 TABLET BY MOUTH DAILY. 02/10/24   Marcine Matar, MD  metFORMIN (GLUCOPHAGE-XR) 500 MG 24 hr tablet Take 1 tablet (500 mg total) by mouth daily with breakfast. 02/10/24   Marcine Matar, MD  Multiple Vitamins-Minerals (MULTIVITAMIN WITH MINERALS) tablet Take 1 tablet by mouth daily.    [provider]  NON FORMULARY SEGA PRO BLADDER HEALTH 100 MG DAILY    [provider]  solifenacin (VESICARE) 10 MG tablet Take 1 tablet (10 mg total) by mouth daily. 02/10/24   Marcine Matar, MD  traMADol (ULTRAM) 50 MG tablet Take 1 tablet (50 mg total) by mouth every 12 (twelve) hours as needed. 03/03/24   Marcine Matar, MD  TRUEplus Lancets 28G MISC Use to check blood sugar once daily. 08/13/23   Marcine Matar, MD      Allergies    Ace inhibitors and Erythromycin base    Review of Systems   Review of Systems  Physical Exam Updated Vital Signs BP (!) 149/89   Pulse 79   Temp 98.5 F (36.9 C) (Oral)   Resp (!) 25   Ht 5\' 2"  (1.575 m)   Wt 131 kg   SpO2 100%   BMI 52.82 kg/m  Physical Exam Vitals  and nursing note reviewed.  HENT:     Head:     Comments: Occipital tenderness more towards base of skull. Eyes:     Extraocular Movements: Extraocular movements intact.     Pupils: Pupils are equal, round, and reactive to light.  Neck:     Comments: Tenderness at superior aspect of cervical spine. Cardiovascular:     Rate and Rhythm: Regular rhythm.  Abdominal:     Tenderness: There is no abdominal tenderness.  Musculoskeletal:     Cervical back: Tenderness present.  Neurological:     Mental Status: She is alert.     Comments: Finger-nose intact.  No nystagmus.     ED Results / Procedures / Treatments   Labs (all labs ordered are listed, but only abnormal results are displayed) Labs Reviewed  CBC - Abnormal; Notable for the following components:      Result Value   Hemoglobin 11.5 (*)     HCT 35.9 (*)    MCH 25.8 (*)    RDW 15.6 (*)    All other components within normal limits  I-STAT CHEM 8, ED - Abnormal; Notable for the following components:   Glucose, Bld 100 (*)    TCO2 20 (*)    All other components within normal limits    EKG None  Radiology CT C-SPINE NO CHARGE Result Date: 03/05/2024 CLINICAL DATA:  Fall at work on Wednesday. Head strike. No loss of consciousness. EXAM: CT CERVICAL SPINE WITHOUT CONTRAST TECHNIQUE: Multidetector CT imaging of the cervical spine was performed without intravenous contrast. Multiplanar CT image reconstructions were also generated. RADIATION DOSE REDUCTION: This exam was performed according to the departmental dose-optimization program which includes automated exposure control, adjustment of the mA and/or kV according to patient size and/or use of iterative reconstruction technique. COMPARISON:  None Available. FINDINGS: Alignment: 3 mm degenerative anterolisthesis C3 on C4. Degenerative reversal of the normal cervical lordosis. Skull base and vertebrae: No acute fracture. No primary bone lesion or focal pathologic process. Soft tissues and spinal canal: No prevertebral fluid or swelling. No visible canal hematoma. Disc levels: Multilevel cervical spondylosis, worst at C5-6, where there is at least mild spinal canal stenosis. Upper chest: No acute findings. Other: None. IMPRESSION: 1. No acute fracture or traumatic listhesis of the cervical spine. 2. Multilevel cervical spondylosis, worst at C5-6, where there is at least mild spinal canal stenosis. Electronically Signed   By: Orvan Falconer M.D.   On: 03/05/2024 13:50   CT ANGIO HEAD NECK W WO CM Result Date: 03/05/2024 CLINICAL DATA:  Neck trauma, arterial injury suspected. Fall at work on Wednesday. EXAM: CT ANGIOGRAPHY HEAD AND NECK WITH AND WITHOUT CONTRAST TECHNIQUE: Multidetector CT imaging of the head and neck was performed using the standard protocol during bolus administration of  intravenous contrast. Multiplanar CT image reconstructions and MIPs were obtained to evaluate the vascular anatomy. Carotid stenosis measurements (when applicable) are obtained utilizing NASCET criteria, using the distal internal carotid diameter as the denominator. RADIATION DOSE REDUCTION: This exam was performed according to the departmental dose-optimization program which includes automated exposure control, adjustment of the mA and/or kV according to patient size and/or use of iterative reconstruction technique. CONTRAST:  80mL OMNIPAQUE IOHEXOL 350 MG/ML SOLN COMPARISON:  Head CT 06/24/2022. FINDINGS: CT HEAD FINDINGS Brain: No acute intracranial hemorrhage. Gray-white differentiation is preserved. No hydrocephalus or extra-axial collection. No mass effect or midline shift. Vascular: No hyperdense vessel or unexpected calcification. Skull: No calvarial fracture or suspicious bone lesion. Skull  base is unremarkable. Sinuses/Orbits: No acute findings. Other: None. Review of the MIP images confirms the above findings CTA NECK FINDINGS Aortic arch: Three-vessel arch configuration. Mild atherosclerotic calcifications of the aortic arch and arch vessel origins. Arch vessel origins are patent. Right carotid system: No evidence of dissection, stenosis (50% or greater), or occlusion. Mild calcified plaque along the proximal right cervical ICA. Left carotid system: No evidence of dissection, stenosis (50% or greater), or occlusion. Mild calcified plaque along the proximal left cervical ICA. Vertebral arteries: Left-dominant. No evidence of dissection, stenosis (50% or greater), or occlusion. Skeleton: Mild cervical spondylosis without high-grade spinal canal stenosis. No suspicious bone lesions. Other neck: Multinodular thyroid. Upper chest: Unremarkable. Review of the MIP images confirms the above findings CTA HEAD FINDINGS Anterior circulation: Calcified plaque along the carotid siphons without hemodynamically  significant stenosis. The proximal ACAs and MCAs are patent without stenosis or aneurysm. Distal branches are symmetric. Posterior circulation: Normal basilar artery. The SCAs, AICAs and PICAs are patent proximally. The PCAs are patent proximally without stenosis or aneurysm. Distal branches are symmetric. Venous sinuses: As permitted by contrast timing, patent. Anatomic variants: None. Review of the MIP images confirms the above findings IMPRESSION: 1. No acute intracranial abnormality. 2. No large vessel occlusion, hemodynamically significant stenosis, or evidence of acute arterial injury in the head or neck. 3. Multinodular thyroid. Electronically Signed   By: Orvan Falconer M.D.   On: 03/05/2024 13:47    Procedures Procedures    Medications Ordered in ED Medications  iohexol (OMNIPAQUE) 350 MG/ML injection 80 mL (80 mLs Intravenous Contrast Given 03/05/24 1254)  prochlorperazine (COMPAZINE) injection 5 mg (5 mg Intravenous Given 03/05/24 1336)  diazepam (VALIUM) injection 2.5 mg (2.5 mg Intravenous Given 03/05/24 1427)  meclizine (ANTIVERT) tablet 25 mg (25 mg Oral Given 03/05/24 1427)    ED Course/ Medical Decision Making/ A&P                                 Medical Decision Making Amount and/or Complexity of Data Reviewed Labs: ordered. Radiology: ordered.  Risk Prescription drug management.   Patient with fall.  Hit head 2 days ago.  Now having dizziness.  Differential diagnosis does not cause such as intracranial hemorrhage, cervical spine fracture, vertebral artery injury or dissection.  Will get head CT cervical spine CT and angiogram of head and neck.  Imaging reassuring.  Negative for intracranial hemorrhage or cervical spine or vessel damage.  However had worsening vertigo after the contrast.  Given Valium now more sleepy.  However with severe vertigo will now get MRI.    Couple calls for care turned over to Dr. Rodena Medin           Final Clinical Impression(s) / ED  Diagnoses Final diagnoses:  Vertigo    Rx / DC Orders ED Discharge Orders     None         Benjiman Core, MD 03/05/24 [phone]

## 2024-03-05 NOTE — ED Notes (Signed)
 Assisted patient to bedside commode

## 2024-03-05 NOTE — ED Notes (Signed)
 Patient up to bedside commode

## 2024-03-05 NOTE — ED Triage Notes (Signed)
 Patient presented to ER for fall at work on Wednesday, endorses hitting head but denies LOC. Patient does not take any blood thinners. Patient states she had some dizziness this morning when she woke up but has since resolved, was recommended to come to ER by PCP.

## 2024-03-05 NOTE — ED Notes (Signed)
 Patient is more dizzy than before CT.

## 2024-03-08 ENCOUNTER — Other Ambulatory Visit: Payer: Self-pay

## 2024-03-12 ENCOUNTER — Ambulatory Visit: Payer: Self-pay | Admitting: Medical

## 2024-03-12 ENCOUNTER — Other Ambulatory Visit: Payer: Self-pay | Admitting: Medical

## 2024-03-12 VITALS — BP 124/80 | HR 87 | Temp 98.4°F | Wt 282.0 lb

## 2024-03-12 DIAGNOSIS — S060X0D Concussion without loss of consciousness, subsequent encounter: Secondary | ICD-10-CM

## 2024-03-12 DIAGNOSIS — R42 Dizziness and giddiness: Secondary | ICD-10-CM

## 2024-03-12 DIAGNOSIS — S060XAD Concussion with loss of consciousness status unknown, subsequent encounter: Secondary | ICD-10-CM | POA: Diagnosis not present

## 2024-03-12 DIAGNOSIS — W19XXXD Unspecified fall, subsequent encounter: Secondary | ICD-10-CM | POA: Diagnosis not present

## 2024-03-12 NOTE — Progress Notes (Signed)
 Subjective:  Linda Cooper is a 63 y.o. female who presents for Chief Complaint  Patient presents with   Hospitalization Follow-up    Larey Seat last Wednesday and hit head on floor, not having issues until last Friday and went to ER due to dizziness, MRI and CT was normal. ER doctor said it was concussion and vertigo combined. Was given meclizine and pain medication. Her Dizziness got worse when they adminstered the constrast for CT. Meclizine is helping but not fast enough   Here today with her female cousin for hospital follow-up.she was seen on March 05, 2024 at the emergency department.  She actually fell March 03, 2024  She was sitting in a rolling office chair and she went to lean forward in the chair pushed out behind her and she fell backwards hitting her head.  She ended up going to the hospital because of dizziness and not feeling well  She had a evaluation including scans and labs.  The dizziness continued for several days after the fall.  She still has some dizziness but it comes and goes.  She feels little off balance.  If she leans her head forward it is worse.  No confusion, no nausea or vomiting, no palpitations, no slurred speech.  No irritability.  She was using her cane but now she has been using a walker for better steadiness on her feet.  She is using meclizine 3 times a day for dizzy symptoms  She was prescribed Valium in the hospital but is not using this.  No other aggravating or relieving factors.    No other c/o.  Past Medical History:  Diagnosis Date   'Light-for-dates' infant with signs of fetal malnutrition    Anemia    Arthritis    Asthma    Diabetes mellitus without complication (HCC) Dx 2007   GERD (gastroesophageal reflux disease)    Hypertension Dx 1990   Knee pain, bilateral    Obesity    Rheumatic fever childhood    has intermittent murmur and hand arthritis    Current Outpatient Medications on File Prior to Visit  Medication Sig Dispense  Refill   albuterol (VENTOLIN HFA) 108 (90 Base) MCG/ACT inhaler Inhale 2 puffs into the lungs every 4 (four) hours as needed for wheezing. 6.7 g 1   amLODipine (NORVASC) 5 MG tablet Take 1 tablet (5 mg total) by mouth daily. 90 tablet 1   diazepam (VALIUM) 5 MG tablet Take 1 tablet (5 mg total) by mouth 2 (two) times daily. 10 tablet 0   hydrALAZINE (APRESOLINE) 100 MG tablet Take 1 tablet (100 mg total) by mouth 2 (two) times daily. 180 tablet 3   losartan-hydrochlorothiazide (HYZAAR) 100-25 MG tablet TAKE 1 TABLET BY MOUTH DAILY. 90 tablet 1   meclizine (ANTIVERT) 25 MG tablet Take 1 tablet (25 mg total) by mouth 3 (three) times daily as needed for dizziness. 30 tablet 0   metFORMIN (GLUCOPHAGE-XR) 500 MG 24 hr tablet Take 1 tablet (500 mg total) by mouth daily with breakfast. 90 tablet 1   NON FORMULARY SEGA PRO BLADDER HEALTH 100 MG DAILY     solifenacin (VESICARE) 10 MG tablet Take 1 tablet (10 mg total) by mouth daily. 90 tablet 1   traMADol (ULTRAM) 50 MG tablet Take 1 tablet (50 mg total) by mouth every 12 (twelve) hours as needed. 60 tablet 1   Blood Glucose Monitoring Suppl (BLOOD GLUCOSE MONITOR SYSTEM) w/Device KIT use daily as directed. 1 kit 0   glucose  blood (TRUE METRIX BLOOD GLUCOSE TEST) test strip Use to check blood sugar once daily. 100 each 6   Multiple Vitamins-Minerals (MULTIVITAMIN WITH MINERALS) tablet Take 1 tablet by mouth daily. (Patient not taking: Reported on 03/12/2024)     TRUEplus Lancets 28G MISC Use to check blood sugar once daily. 100 each 6   No current facility-administered medications on file prior to visit.     The following portions of the patient's history were reviewed and updated as appropriate: allergies, current medications, past family history, past medical history, past social history, past surgical history and problem list.  ROS Otherwise as in subjective above  Objective: BP 124/80   Pulse 87   Temp 98.4 F (36.9 C)   Wt 282 lb (127.9 kg)    SpO2 98%   BMI 51.58 kg/m   General appearance: alert, no distress, well developed, well nourished Mild tenderness of left posterior occipital scalp, otherwise no obvious deformity of scalp or head HEENT: normocephalic, sclerae anicteric, conjunctiva pink and moist, TMs pearly, nares patent, no discharge or erythema, pharynx normal Oral cavity: MMM, no lesions Neck: supple, no lymphadenopathy, no thyromegaly, no masses supple, nontender Heart: RRR, normal S1, S2, no murmurs Lungs: CTA bilaterally, no wheezes, rhonchi, or rales Pulses: 2+ radial pulses, 2+ pedal pulses, normal cap refill Ext: no edema Psych: Pleasant, answers questions appropriately Neuro: She seems to feel little off balance standing or following finger-to-nose or EOM, otherwise neuro nonfocal exam A&O x 3  Brain MRI 03/05/2024: IMPRESSION: Negative brain MRI  CT cervical spine 03/05/2024 and CT angiogram head: IMPRESSION: 1. No acute intracranial abnormality. 2. No large vessel occlusion, hemodynamically significant stenosis, or evidence of acute arterial injury in the head or neck. 3. Multinodular thyroid.      Assessment: Encounter Diagnoses  Name Primary?   Concussion with unknown loss of consciousness status, subsequent encounter Yes   Fall, subsequent encounter    Dizziness      Plan: I reviewed her hospital Emergency Department visit from 03/05/2024   Will refer to physical therapy and postconcussion clinic for follow-up.  Advised complete rest over the weekend.  Discussed importance of brain rest and not using stimuli or activity or exercise with the setting of concussion  Advised Tylenol for any headache but she is not reporting much headache currently  Can use meclizine as needed.  Avoid the Valium since she never started it and it can cause sedation.  Continue your usual medicines as usual  Advise she get an updated battery for her glucometer so she can monitor her sugars  If any new or  worse symptoms over the weekend and go back to the emergency department  She seems to be improving gradually but needs to take the weekend for complete rest  Linda Cooper was seen today for hospitalization follow-up.  Diagnoses and all orders for this visit:  Concussion with unknown loss of consciousness status, subsequent encounter -     Ambulatory referral to Physical Medicine Rehab  Fall, subsequent encounter -     Ambulatory referral to Physical Medicine Rehab  Dizziness -     Ambulatory referral to Physical Medicine Rehab    Follow up: with referrals

## 2024-03-12 NOTE — Progress Notes (Unsigned)
 Subjective:  Linda Cooper is a 63 y.o. female who presents for No chief complaint on file.     ***  No other aggravating or relieving factors.    No other c/o.  The following portions of the patient's history were reviewed and updated as appropriate: allergies, current medications, past family history, past medical history, past social history, past surgical history and problem list.  ROS Otherwise as in subjective above  Objective: There were no vitals taken for this visit.  General appearance: alert, no distress, well developed, well nourished HEENT: normocephalic, sclerae anicteric, conjunctiva pink and moist, TMs pearly, nares patent, no discharge or erythema, pharynx normal Oral cavity: MMM, no lesions Neck: supple, no lymphadenopathy, no thyromegaly, no masses Heart: RRR, normal S1, S2, no murmurs Lungs: CTA bilaterally, no wheezes, rhonchi, or rales Abdomen: +bs, soft, non tender, non distended, no masses, no hepatomegaly, no splenomegaly Pulses: 2+ radial pulses, 2+ pedal pulses, normal cap refill Ext: no edema   Assessment: Encounter Diagnoses  Name Primary?   Concussion without loss of consciousness, subsequent encounter Yes   Dizziness      Plan: ***  Diagnoses and all orders for this visit:  Concussion without loss of consciousness, subsequent encounter -     Ambulatory referral to Physical Therapy  Dizziness -     Ambulatory referral to Physical Therapy    Follow up: ***

## 2024-03-17 ENCOUNTER — Other Ambulatory Visit: Payer: Self-pay

## 2024-03-17 ENCOUNTER — Other Ambulatory Visit: Payer: Self-pay | Admitting: Internal Medicine

## 2024-03-17 ENCOUNTER — Encounter: Payer: Self-pay | Admitting: Physical Medicine & Rehabilitation

## 2024-03-17 MED ORDER — MECLIZINE HCL 25 MG PO TABS
25.0000 mg | ORAL_TABLET | Freq: Three times a day (TID) | ORAL | 0 refills | Status: DC | PRN
  Filled 2024-03-17: qty 30, 10d supply, fill #0

## 2024-03-17 NOTE — Telephone Encounter (Signed)
 Copied from CRM [id number]. Topic: Clinical - Medication Refill >> Mar 17, 2024  2:30 PM Tiffany S wrote: Most Recent Primary Care Visit:  Provider: Jac Canavan  Department: [facility]  Visit Type: HOSPITAL FOLLOW UP  Date: 03/12/2024  Medication: meclizine (ANTIVERT) 25 MG tablet [[id number]]  Has the patient contacted their pharmacy? Yes (Agent: If no, request that the patient contact the pharmacy for the refill. If patient does not wish to contact the pharmacy document the reason why and proceed with request.) (Agent: If yes, when and what did the pharmacy advise?)  Is this the correct pharmacy for this prescription? Yes If no, delete pharmacy and type the correct one.  This is the patient's preferred pharmacy:   [facility] [address], [address] [place] [address] Phone: [phone] Fax: [phone]    Has the prescription been filled recently? Yes  Is the patient out of the medication? Yes  Has the patient been seen for an appointment in the last year OR does the patient have an upcoming appointment? Yes  Can we respond through MyChart? Yes  Agent: Please be advised that Rx refills may take up to 3 business days. We ask that you follow-up with your pharmacy.

## 2024-03-17 NOTE — Telephone Encounter (Signed)
 Requested medication (s) are due for refill today: Yes  Requested medication (s) are on the active medication list: Yes  Last refill:  03/05/24 by hospital provider  Future visit scheduled: Yes  Notes to clinic:  Unable to refill per protocol, cannot delegate, last refill by another provider from hospital visit.       Requested Prescriptions  Pending Prescriptions Disp Refills   meclizine (ANTIVERT) 25 MG tablet 30 tablet 0    Sig: Take 1 tablet (25 mg total) by mouth 3 (three) times daily as needed for dizziness.     Not Delegated - Gastroenterology: Antiemetics Failed - 03/17/2024  2:45 PM      Failed - This refill cannot be delegated      Passed - Valid encounter within last 6 months    Recent Outpatient Visits           1 month ago Type 2 diabetes mellitus with morbid obesity (HCC)   [facility] Of [place]. [facility] Jonah Blue B, MD   7 months ago Type 2 diabetes mellitus with morbid obesity [facility])   [facility] Of [place]. [facility] Jonah Blue B, MD   1 year ago Type 2 diabetes mellitus with morbid obesity [facility])   [facility] - A Dept Of [place]. [facility] Jonah Blue B, MD   1 year ago Type 2 diabetes mellitus with morbid obesity [facility])   [facility] - A Dept Of [place]. [facility] Lois Huxley, Ranger L, RPH-CPP   1 year ago Type 2 diabetes mellitus with morbid obesity (HCC)   [facility] - A Dept Of [place]. [facility] Marcine Matar, MD       Future Appointments             In 2 months Laural Benes Binnie Rail, MD [facility] - A Dept Of [place]. [facility]

## 2024-03-17 NOTE — Telephone Encounter (Unsigned)
 Copied from CRM [id number]. Topic: Clinical - Medication Refill >> Mar 17, 2024  2:30 PM Tiffany S wrote: Most Recent Primary Care Visit:  Provider: Jac Canavan  Department: [facility]  Visit Type: HOSPITAL FOLLOW UP  Date: 03/12/2024  Medication: meclizine (ANTIVERT) 25 MG tablet [[id number]]  Has the patient contacted their pharmacy? Yes (Agent: If no, request that the patient contact the pharmacy for the refill. If patient does not wish to contact the pharmacy document the reason why and proceed with request.) (Agent: If yes, when and what did the pharmacy advise?)  Is this the correct pharmacy for this prescription? Yes If no, delete pharmacy and type the correct one.  This is the patient's preferred pharmacy:   [facility] [address], [address] [place] [address] Phone: [phone] Fax: [phone]    Has the prescription been filled recently? Yes  Is the patient out of the medication? Yes  Has the patient been seen for an appointment in the last year OR does the patient have an upcoming appointment? Yes  Can we respond through MyChart? Yes  Agent: Please be advised that Rx refills may take up to 3 business days. We ask that you follow-up with your pharmacy.

## 2024-03-18 ENCOUNTER — Inpatient Hospital Stay: Admitting: Physician Assistant

## 2024-03-23 NOTE — Therapy (Signed)
 OUTPATIENT PHYSICAL THERAPY VESTIBULAR EVALUATION     Patient Name: Linda Cooper MRN: [id number] DOB:Nov 15, 1961, 63 y.o., female Today's Date: 03/24/2024  END OF SESSION:  PT End of Session - 03/24/24 1439     Visit Number 1    Number of Visits 9    Date for PT Re-Evaluation 04/23/24    Authorization Type [employer]    PT Start Time 1435    PT Stop Time 1525    PT Time Calculation (min) 50 min    Activity Tolerance Patient tolerated treatment well   limited by dizziness   Behavior During Therapy [facility] for tasks assessed/performed             Past Medical History:  Diagnosis Date   'Light-for-dates' infant with signs of fetal malnutrition    Anemia    Arthritis    Asthma    Diabetes mellitus without complication (HCC) Dx 2007   GERD (gastroesophageal reflux disease)    Hypertension Dx 1990   Knee pain, bilateral    Obesity    Rheumatic fever childhood    has intermittent murmur and hand arthritis    Past Surgical History:  Procedure Laterality Date   ABDOMINAL HYSTERECTOMY  2006    HERNIA REPAIR  2006    Umbilical    Patient Active Problem List   Diagnosis Date Noted   Hypertension associated with diabetes (HCC) 08/13/2023   Type 2 diabetes mellitus with morbid obesity (HCC) 08/13/2023   Iron deficiency anemia 08/13/2023   Urge incontinence 08/31/2021   Pain, dental 08/16/2021   Trigger middle finger of left hand 11/08/2019   Carpal tunnel syndrome of left wrist 07/09/2019   Morbid obesity with body mass index (BMI) of 50.0 to 59.9 in adult (HCC) 10/16/2018   Morbid obesity (HCC) 11/11/2014   PCOS (polycystic ovarian syndrome) 11/10/2014   Degenerative arthritis of knee, bilateral 11/10/2014    PCP: Lawrance Presume, MD  REFERRING PROVIDER:  Claudene Crystal, PA-C  REFERRING DIAG: [phone] (ICD-10-CM) - Concussion without loss of consciousness, subsequent encounter R42 (ICD-10-CM) - Dizziness    THERAPY DIAG:  BPPV (benign paroxysmal  positional vertigo), right  Dizziness and giddiness  Unsteadiness on feet  ONSET DATE: 03/12/2024  Rationale for Evaluation and Treatment: Rehabilitation  SUBJECTIVE:   SUBJECTIVE STATEMENT: Has a spinning feeling since the 28th. Will get a spinning sensation when laying on her R side, lasts about a minute. Reports it is so intense and feels like she is about to fall off the bed. Has learned to hold on and breathe through it. When looking to her R, the spinning sensation will come back. Also will get dizzy when looking down. Has been taking Meclizine 2-3 times a day. Walks in to clinic with [facility], but provided pt with RW to ambulate back into clinic due to unsteadiness. Feels like when she leans her head to the R, feels like something is moving. When going to tie her shoes this morning, pt got dizzy. Prior to episode, would use cane out in the community and did not use it at home. Has a walker to use at home.   Pt accompanied by:  Daved Eriksson   PERTINENT HISTORY:   Per PCP:  [name] last Wednesday (03/03/24)- was at work sitting in a chair and the chair moved and she fell backwards and fell back hitting her head, was not having issues until last Friday and went to ER on 03/05/24 due to dizziness, MRI and CT was normal.  ER doctor said it was concussion and vertigo combined. Was given meclizine and pain medication. Her Dizziness got worse when they adminstered the constrast for CT. Meclizine is helping but not fast enough  The dizziness continued for several days after the fall. She still has some dizziness but it comes and goes. She feels little off balance. If she leans her head forward it is worse.   PMH: Diabetes, anemia, HTN, bilateral knee pain, obesity   PAIN:  Are you having pain? No  Vitals:   03/24/24 1456 03/24/24 1457  BP: (!) 135/104 128/81  Pulse:  85   Taken on L upper arm and then taken on R forearm   PRECAUTIONS: Fall  FALLS: Has patient fallen in last 6 months? Yes.  Number of falls 1  LIVING ENVIRONMENT: Lives with: lives with their son Lives in: House/apartment Stairs: 1 floor, has a step to get from den to [employer] or to laundry room  Has following equipment at home: Single point cane, Environmental consultant - 2 wheeled, shower chair, and Ramped entry  PLOF: Independent with community mobility with device, Vocation/Vocational requirements: Does in-home visits for Medicaid, and Leisure: likes to crochet  Currently not driving   PATIENT GOALS: Wants to not fall again and wants dizziness to go away   OBJECTIVE:  Note: Objective measures were completed at Evaluation unless otherwise noted.  DIAGNOSTIC FINDINGS: Brain MRI 03/05/2024: IMPRESSION: Negative brain MRI   CT cervical spine 03/05/2024 and CT angiogram head: IMPRESSION: 1. No acute intracranial abnormality. 2. No large vessel occlusion, hemodynamically significant stenosis, or evidence of acute arterial injury in the head or neck. 3. Multinodular thyroid.  COGNITION: Overall cognitive status: Within functional limits for tasks assessed    POSTURE:  rounded shoulders, forward head, and posterior pelvic tilt  Cervical ROM:   AROM WFL, moves very slowly and guarded with EC   TRANSFERS: Assistive device utilized: None, has RW in front of patient  Sit to stand: SBA Stand to sit: SBA Pt using BUE support from chair to stand, has RW in front of pt   GAIT: Gait pattern: decreased stride length and decreased trunk rotation, unsteadiness, guarded with gait and does not turn head  Distance walked: clinic distances  Assistive device utilized: Single point cane, Walker - 2 wheeled, and Grab bars Level of assistance: SBA and CGA Comments: Pt ambulates in with SPC and holding onto the wall, PT had to go grab RW for pt to ambulate back into clinic with, pt ambulates with slow gait speed due to dizziness    VESTIBULAR ASSESSMENT:  GENERAL OBSERVATION: Ambulates in to clinic with SPC and holding onto  walls, PT providing pt with clinic RW due to unsteadiness to ambulate back into session, pt ambulates slowly    SYMPTOM BEHAVIOR:  Subjective history: See above   Non-Vestibular symptoms: neck pain  Type of dizziness: Spinning/Vertigo  Frequency: Daily, gotten better the past couple of days   Duration: lasts about a minute when laying down on R side   Aggravating factors: Induced by position change: laying down on R side and Induced by motion: bending down to the ground and turning head quickly fast movement on a TV  Relieving factors: closing eyes, trying to talk herself through it   Progression of symptoms: better  OCULOMOTOR EXAM:  Ocular Alignment: normal  Ocular ROM: No Limitations  Spontaneous Nystagmus: absent  Gaze-Induced Nystagmus: absent  Smooth Pursuits: intact  Saccades:  ~2-3 saccadic beats to R with weird sensation, ~2  saccadic beats in vertical direction  Convergence/Divergence: 6 cm   VESTIBULAR - OCULAR REFLEX:   Slow VOR: Normal  VOR Cancellation: Normal  Head-Impulse Test: HIT Right: positive HIT Left: negative Pt reporting some dizziness going to the R side, pt initially closing her eyes when assessing to the L    POSITIONAL TESTING: Right Dix-Hallpike: upbeating, right nystagmus and pt immediately closing her eyes, so hard to tell duration, lasts about 15-20 seconds  Performed with 2 pillows behind pt, pt taking incr time to get into long sitting position before assessing due to body habitus     [place] Post Concussion Symptoms Questionnaire:  Compared to before the accident, do you now (last 24 hours) suffer from:  Headaches 0 = not experienced  Feeling of Dizziness 4 = severe problem  Nausea and/or vomiting 4 = severe problem  RPQ-3 (total of first 3 items) 8     Noise sensitivity (easily upset by loud noises) 4 = severe problem  Sleep disturbances 4 = severe problem  Fatigue, tiring more easily 3 = moderate problem  Being irritable, easily  angered: 4 = severe problem  Feeling depressed or tearful 3 = moderate problem  Feeling frustrated or impatient 3 = moderate problem  Forgetfulness, poor memory 4 = severe problem  Poor concentration 0 = not experienced  Taking longer to think 3 = moderate problem  Blurred vision 4 = severe problem  Light sensitivity (easily upset by bright light) 3 = moderate problem  Double vision 4 = severe problem  Restlessness 4 = severe problem  RPQ-13 (total for next 13 items) 43                                                                                                                              TREATMENT DATE: 03/24/24   Canalith Repositioning: Epley Right: Number of Reps: 1, Response to Treatment: symptoms improved, and Comment: pt with incr symptoms during 1st and 3rd position with pt closing her eyes and needing to grab out to hold onto the mat table. Pt letting out a little yell when rolling onto her side due to dizziness/nystagmus. Pt needing CGA with return to upright with some dizziness. Overall pt able to tolerate well . Did not get to re-assess at end of session due to time constraints    Pt able to ambulate out of session with RW with supervision and demonstrating a faster gait speed than when she came in    PATIENT EDUCATION: Education details: Provided BPPV education and etiology, purpose of treating with Epley maneuver, what to expect after treatment, clinical findings, POC  Person educated: Patient, pt's cousin  Education method: Explanation, Verbal cues, and Handouts Education comprehension: verbalized understanding and needs further education  HOME EXERCISE PROGRAM: Will provide at future session   GOALS: Goals reviewed with patient? Yes  SHORT TERM GOALS: ALL STGS = LTGS  LONG TERM GOALS: Target date: 04/21/2024  Pt will be independent  with final HEP for vestibular deficits/dizziness in order to build upon functional gains made in therapy. Baseline: will provide  at future session if needed  Goal status: INITIAL  2.  Further vestibular testing to be assessed with goal written after resolution of BPPV. Baseline: will assess after resolution of R posterior canal BPPV  Goal status: INITIAL  3.  Pt will demo negative positional testing in order to demo decr dizziness in daily life.  Baseline: (+) R posterior canalithiasis  Goal status: INITIAL  4.  Pt will decr RPQ-3 to 4 or less and RPQ-13 to 30 or less in order to demo improved symptoms.  Baseline: RPQ-3 = 8, RPQ-13 = 43 Goal status: INITIAL    ASSESSMENT:  CLINICAL IMPRESSION: Patient is a 63 year old female referred to Neuro OPPT for Concussion/Dizziness.   Pt's PMH is significant for: Diabetes, anemia, HTN, bilateral knee pain, obesity . The following deficits were present during the exam: impaired saccades (when going right and in the vertical direction), positive HIT to the R, dizziness, gait abnormalities, impaired balance. Pt with R upbeating rotary nystagmus in R DixHallpike, indicating R posterior canalithiasis. Pt with intense symptoms and needing to grab onto the mat table. Performed 1 rep of the Epley maneuver today. Did not get to re-assess due to time constraints. Will perform further vestibular assessment after BPPV is cleared. Based on BPPV, pt is an incr risk for falls. Pt would benefit from skilled PT to address these impairments and functional limitations to maximize functional mobility independence and decr fall risk.    OBJECTIVE IMPAIRMENTS: Abnormal gait, decreased activity tolerance, decreased balance, decreased mobility, difficulty walking, decreased ROM, dizziness, postural dysfunction, and obesity.   ACTIVITY LIMITATIONS: carrying, lifting, bending, standing, stairs, transfers, bed mobility, locomotion level, and caring for others  PARTICIPATION LIMITATIONS: laundry, driving, shopping, community activity, occupation, and yard work  PERSONAL FACTORS: Age, Behavior pattern,  Past/current experiences, Time since onset of injury/illness/exacerbation, and 3+ comorbidities: Diabetes, anemia, HTN, bilateral knee pain, obesity   are also affecting patient's functional outcome.   REHAB POTENTIAL: Good  CLINICAL DECISION MAKING: Evolving/moderate complexity  EVALUATION COMPLEXITY: Moderate   PLAN:  PT FREQUENCY: 1-2x/week  PT DURATION: 4 weeks  PLANNED INTERVENTIONS: 97164- PT Re-evaluation, 97110-Therapeutic exercises, 97530- Therapeutic activity, 97112- Neuromuscular re-education, 97535- Self Care, [id number]- Manual therapy, [id number]- Gait training, [phone]- Canalith repositioning, Patient/Family education, Balance training, Vestibular training, and DME instructions  PLAN FOR NEXT SESSION: re-assess R posterior canal and treat, do further vestibular assessment as needed once BPPV is cleared. Check BP on pt's forearm    Seabron Cypress, PT, DPT 03/24/2024, 4:17 PM

## 2024-03-24 ENCOUNTER — Encounter: Payer: Self-pay | Admitting: Physical Therapy

## 2024-03-24 ENCOUNTER — Ambulatory Visit: Attending: Medical | Admitting: Physical Therapy

## 2024-03-24 VITALS — BP 128/81 | HR 85

## 2024-03-24 DIAGNOSIS — S060X0D Concussion without loss of consciousness, subsequent encounter: Secondary | ICD-10-CM | POA: Diagnosis not present

## 2024-03-24 DIAGNOSIS — R42 Dizziness and giddiness: Secondary | ICD-10-CM | POA: Diagnosis present

## 2024-03-24 DIAGNOSIS — H8111 Benign paroxysmal vertigo, right ear: Secondary | ICD-10-CM | POA: Insufficient documentation

## 2024-03-24 DIAGNOSIS — R2681 Unsteadiness on feet: Secondary | ICD-10-CM | POA: Insufficient documentation

## 2024-03-30 ENCOUNTER — Other Ambulatory Visit: Payer: Self-pay

## 2024-03-30 ENCOUNTER — Encounter: Payer: Self-pay | Admitting: Physical Therapy

## 2024-03-30 ENCOUNTER — Other Ambulatory Visit: Payer: Self-pay | Admitting: Internal Medicine

## 2024-03-30 ENCOUNTER — Ambulatory Visit: Admitting: Physical Therapy

## 2024-03-30 DIAGNOSIS — H8111 Benign paroxysmal vertigo, right ear: Secondary | ICD-10-CM | POA: Diagnosis not present

## 2024-03-30 NOTE — Therapy (Signed)
 OUTPATIENT PHYSICAL THERAPY VESTIBULAR EVALUATION     Patient Name: Linda Cooper MRN: [id number] DOB:January 26, 1961, 63 y.o., female Today's Date: 03/30/2024  END OF SESSION:  PT End of Session - 03/30/24 1604     Visit Number 2    Number of Visits 9    Date for PT Re-Evaluation 04/23/24    Authorization Type [employer]    PT Start Time 1350   started session early   PT Stop Time 1440    PT Time Calculation (min) 50 min    Activity Tolerance Patient tolerated treatment well   limited by dizziness   Behavior During Therapy [facility] for tasks assessed/performed              Past Medical History:  Diagnosis Date   'Light-for-dates' infant with signs of fetal malnutrition    Anemia    Arthritis    Asthma    Diabetes mellitus without complication (HCC) Dx 2007   GERD (gastroesophageal reflux disease)    Hypertension Dx 1990   Knee pain, bilateral    Obesity    Rheumatic fever childhood    has intermittent murmur and hand arthritis    Past Surgical History:  Procedure Laterality Date   ABDOMINAL HYSTERECTOMY  2006    HERNIA REPAIR  2006    Umbilical    Patient Active Problem List   Diagnosis Date Noted   Hypertension associated with diabetes (HCC) 08/13/2023   Type 2 diabetes mellitus with morbid obesity (HCC) 08/13/2023   Iron deficiency anemia 08/13/2023   Urge incontinence 08/31/2021   Pain, dental 08/16/2021   Trigger middle finger of left hand 11/08/2019   Carpal tunnel syndrome of left wrist 07/09/2019   Morbid obesity with body mass index (BMI) of 50.0 to 59.9 in adult (HCC) 10/16/2018   Morbid obesity (HCC) 11/11/2014   PCOS (polycystic ovarian syndrome) 11/10/2014   Degenerative arthritis of knee, bilateral 11/10/2014    PCP: Lawrance Presume, MD  REFERRING PROVIDER:  Claudene Crystal, PA-C  REFERRING DIAG: [phone] (ICD-10-CM) - Concussion without loss of consciousness, subsequent encounter R42 (ICD-10-CM) - Dizziness    THERAPY DIAG:   BPPV (benign paroxysmal positional vertigo), right  ONSET DATE: 03/12/2024  Rationale for Evaluation and Treatment: Rehabilitation  SUBJECTIVE:   SUBJECTIVE STATEMENT: Pt reports she is a little better - not having quite as much dizziness and only took 2 doses of Meclizine  yesterday; has only had 1 dose of Meclizine  today.  Sensation of going down to floor with scooting forward is not as intense as it was; was able to tie shoes this morning but did it without looking down- lifted leg up and felt for laces to tie her shoes   Pt accompanied by:  son, [name]   PERTINENT HISTORY:   Per PCP:  [name] last Wednesday (03/03/24)- was at work sitting in a chair and the chair moved and she fell backwards and fell back hitting her head, was not having issues until last Friday and went to ER on 03/05/24 due to dizziness, MRI and CT was normal. ER doctor said it was concussion and vertigo combined. Was given meclizine  and pain medication. Her Dizziness got worse when they adminstered the constrast for CT. Meclizine  is helping but not fast enough  The dizziness continued for several days after the fall. She still has some dizziness but it comes and goes. She feels little off balance. If she leans her head forward it is worse.   PMH: Diabetes, anemia, HTN, bilateral  knee pain, obesity   PAIN:  Are you having pain? No  There were no vitals filed for this visit.  Taken on L upper arm and then taken on R forearm   PRECAUTIONS: Fall  FALLS: Has patient fallen in last 6 months? Yes. Number of falls 1  LIVING ENVIRONMENT: Lives with: lives with their son Lives in: House/apartment Stairs: 1 floor, has a step to get from den to [employer] or to laundry room  Has following equipment at home: Single point cane, Environmental consultant - 2 wheeled, shower chair, and Ramped entry  PLOF: Independent with community mobility with device, Vocation/Vocational requirements: Does in-home visits for Medicaid, and Leisure: likes to  crochet  Currently not driving   PATIENT GOALS: Wants to not fall again and wants dizziness to go away   OBJECTIVE:  Note: Objective measures were completed at Evaluation unless otherwise noted.  DIAGNOSTIC FINDINGS: Brain MRI 03/05/2024: IMPRESSION: Negative brain MRI   CT cervical spine 03/05/2024 and CT angiogram head: IMPRESSION: 1. No acute intracranial abnormality. 2. No large vessel occlusion, hemodynamically significant stenosis, or evidence of acute arterial injury in the head or neck. 3. Multinodular thyroid .  COGNITION: Overall cognitive status: Within functional limits for tasks assessed    POSTURE:  rounded shoulders, forward head, and posterior pelvic tilt  Cervical ROM:   AROM WFL, moves very slowly and guarded with EC   TRANSFERS: Assistive device utilized: None, has RW in front of patient  Sit to stand: SBA Stand to sit: SBA Pt using BUE support from chair to stand, has RW in front of pt   GAIT: Gait pattern: decreased stride length and decreased trunk rotation, unsteadiness, guarded with gait and does not turn head  Distance walked: clinic distances  Assistive device utilized: Single point cane, Walker - 2 wheeled, and Grab bars Level of assistance: SBA and CGA Comments: Pt ambulates in with SPC and holding onto the wall, PT had to go grab RW for pt to ambulate back into clinic with, pt ambulates with slow gait speed due to dizziness    VESTIBULAR ASSESSMENT:  GENERAL OBSERVATION: Ambulates in to clinic with SPC and holding onto walls, PT providing pt with clinic RW due to unsteadiness to ambulate back into session, pt ambulates slowly    SYMPTOM BEHAVIOR:  Subjective history: See above   Non-Vestibular symptoms: neck pain  Type of dizziness: Spinning/Vertigo  Frequency: Daily, gotten better the past couple of days   Duration: lasts about a minute when laying down on R side   Aggravating factors: Induced by position change: laying down on R side  and Induced by motion: bending down to the ground and turning head quickly fast movement on a TV  Relieving factors: closing eyes, trying to talk herself through it   Progression of symptoms: better  OCULOMOTOR EXAM:  Ocular Alignment: normal  Ocular ROM: No Limitations  Spontaneous Nystagmus: absent  Gaze-Induced Nystagmus: absent  Smooth Pursuits: intact  Saccades:  ~2-3 saccadic beats to R with weird sensation, ~2 saccadic beats in vertical direction  Convergence/Divergence: 6 cm   VESTIBULAR - OCULAR REFLEX:   Slow VOR: Normal  VOR Cancellation: Normal  Head-Impulse Test: HIT Right: positive HIT Left: negative Pt reporting some dizziness going to the R side, pt initially closing her eyes when assessing to the L    POSITIONAL TESTING: Right Dix-Hallpike: upbeating, right nystagmus and pt immediately closing her eyes, so hard to tell duration, lasts about 15-20 seconds  Performed with 2 pillows behind  pt, pt taking incr time to get into long sitting position before assessing due to body habitus     [place] Post Concussion Symptoms Questionnaire:  Compared to before the accident, do you now (last 24 hours) suffer from:  Headaches 0 = not experienced  Feeling of Dizziness 4 = severe problem  Nausea and/or vomiting 4 = severe problem  RPQ-3 (total of first 3 items) 8     Noise sensitivity (easily upset by loud noises) 4 = severe problem  Sleep disturbances 4 = severe problem  Fatigue, tiring more easily 3 = moderate problem  Being irritable, easily angered: 4 = severe problem  Feeling depressed or tearful 3 = moderate problem  Feeling frustrated or impatient 3 = moderate problem  Forgetfulness, poor memory 4 = severe problem  Poor concentration 0 = not experienced  Taking longer to think 3 = moderate problem  Blurred vision 4 = severe problem  Light sensitivity (easily upset by bright light) 3 = moderate problem  Double vision 4 = severe problem  Restlessness 4 = severe  problem  RPQ-13 (total for next 13 items) 43                                                                                                                              TREATMENT DATE: 03/30/24  NeuroRe-ed: Rt Dix-Hallpike test (+) with Rt rotary upbeating nystagmus (low amplitude and short duration) with c/o vertigo in test position  Performed movements to try to provoke vertigo upon completion of 3rd rep of Epley - pt performed horizontal head turns, looking up with approx. 3 sec hold with no provocation of vertigo  Canalith Repositioning: Epley maneuver 3 reps for Rt posterior canalithiasis; symptoms improved with each subsequent rep; no nystagmus and no c/o vertigo in any position of 3rd rep  Self Care: provided additional information on etiology of BPPV; recommended pt to stay well hydrated.  Placed tennis balls on RW for easier maneuverability and propulsion of device   PATIENT EDUCATION: Education details: provided further explanation of BPPV etiology; instructed pt's son, [name], in Epley maneuver but pt declines performing this maneuver at home - handout on Epley declined Person educated: Patient and son, [name] Education method: Explanation Education comprehension: verbalized understanding and needs further education  HOME EXERCISE PROGRAM: Will provide at future session   GOALS: Goals reviewed with patient? Yes  SHORT TERM GOALS: ALL STGS = LTGS  LONG TERM GOALS: Target date: 04/21/2024  Pt will be independent with final HEP for vestibular deficits/dizziness in order to build upon functional gains made in therapy. Baseline: will provide at future session if needed  Goal status: INITIAL  2.  Further vestibular testing to be assessed with goal written after resolution of BPPV. Baseline: will assess after resolution of R posterior canal BPPV  Goal status: INITIAL  3.  Pt will demo negative positional testing in order to demo decr dizziness in daily life.  Baseline: (+)  R posterior canalithiasis  Goal status: INITIAL  4.  Pt will decr RPQ-3 to 4 or less and RPQ-13 to 30 or less in order to demo improved symptoms.  Baseline: RPQ-3 = 8, RPQ-13 = 43 Goal status: INITIAL    ASSESSMENT:  CLINICAL IMPRESSION: PT session focused on treatment of Rt BPPV posterior canalithiasis.  Pt had (+) Rt Dix-Hallpike test with rotary nystagmus and was treated with 3 reps of Epley maneuver with no nystagmus noted in any position of 3rd rep and no c/o vertigo in any position of Epley on 3rd rep.  Pt reported she felt much better at end of session with ability to perform horizontal head turns and cervical extension with no c/o vertigo provoked.  Pt is progressing well towards goals.   OBJECTIVE IMPAIRMENTS: Abnormal gait, decreased activity tolerance, decreased balance, decreased mobility, difficulty walking, decreased ROM, dizziness, postural dysfunction, and obesity.   ACTIVITY LIMITATIONS: carrying, lifting, bending, standing, stairs, transfers, bed mobility, locomotion level, and caring for others  PARTICIPATION LIMITATIONS: laundry, driving, shopping, community activity, occupation, and yard work  PERSONAL FACTORS: Age, Behavior pattern, Past/current experiences, Time since onset of injury/illness/exacerbation, and 3+ comorbidities: Diabetes, anemia, HTN, bilateral knee pain, obesity   are also affecting patient's functional outcome.   REHAB POTENTIAL: Good  CLINICAL DECISION MAKING: Evolving/moderate complexity  EVALUATION COMPLEXITY: Moderate   PLAN:  PT FREQUENCY: 1-2x/week  PT DURATION: 4 weeks  PLANNED INTERVENTIONS: 97164- PT Re-evaluation, 97110-Therapeutic exercises, 97530- Therapeutic activity, 97112- Neuromuscular re-education, 97535- Self Care, 78295- Manual therapy, [id number]- Gait training, [phone]- Canalith repositioning, Patient/Family education, Balance training, Vestibular training, and DME instructions  PLAN FOR NEXT SESSION: Recheck Rt BPPV (should  be resolved based on treatment results on 03-30-24);  do further vestibular assessment as needed once BPPV is cleared. Check BP on pt's forearm    Dorsie Gaunt, PT 03/30/2024, 4:07 PM

## 2024-03-31 ENCOUNTER — Telehealth: Payer: Self-pay | Admitting: Medical

## 2024-03-31 NOTE — Telephone Encounter (Signed)
 I am not sure what you are talking about. She has been sent to Neuro [facility] for [facility]

## 2024-03-31 NOTE — Telephone Encounter (Signed)
 See other message in the chart record.  I believe she needs referral to neurology for postconcussion follow-up  You may want to call her first to make sure she still is requesting this

## 2024-04-02 ENCOUNTER — Ambulatory Visit: Attending: Internal Medicine | Admitting: Internal Medicine

## 2024-04-02 ENCOUNTER — Ambulatory Visit: Admitting: Physical Therapy

## 2024-04-02 ENCOUNTER — Encounter: Payer: Self-pay | Admitting: Internal Medicine

## 2024-04-02 ENCOUNTER — Other Ambulatory Visit: Payer: Self-pay

## 2024-04-02 ENCOUNTER — Encounter: Payer: Self-pay | Admitting: Physical Therapy

## 2024-04-02 VITALS — BP 153/98 | HR 82 | Ht 62.0 in | Wt 280.0 lb

## 2024-04-02 DIAGNOSIS — Z7984 Long term (current) use of oral hypoglycemic drugs: Secondary | ICD-10-CM

## 2024-04-02 DIAGNOSIS — H8111 Benign paroxysmal vertigo, right ear: Secondary | ICD-10-CM | POA: Diagnosis not present

## 2024-04-02 DIAGNOSIS — W07XXXA Fall from chair, initial encounter: Secondary | ICD-10-CM

## 2024-04-02 DIAGNOSIS — E119 Type 2 diabetes mellitus without complications: Secondary | ICD-10-CM

## 2024-04-02 DIAGNOSIS — E1159 Type 2 diabetes mellitus with other circulatory complications: Secondary | ICD-10-CM

## 2024-04-02 DIAGNOSIS — I10 Essential (primary) hypertension: Secondary | ICD-10-CM | POA: Diagnosis not present

## 2024-04-02 DIAGNOSIS — R42 Dizziness and giddiness: Secondary | ICD-10-CM | POA: Diagnosis not present

## 2024-04-02 DIAGNOSIS — Z1231 Encounter for screening mammogram for malignant neoplasm of breast: Secondary | ICD-10-CM

## 2024-04-02 DIAGNOSIS — R2681 Unsteadiness on feet: Secondary | ICD-10-CM

## 2024-04-02 DIAGNOSIS — W19XXXA Unspecified fall, initial encounter: Secondary | ICD-10-CM

## 2024-04-02 DIAGNOSIS — Z1211 Encounter for screening for malignant neoplasm of colon: Secondary | ICD-10-CM

## 2024-04-02 MED ORDER — MECLIZINE HCL 25 MG PO TABS
25.0000 mg | ORAL_TABLET | Freq: Every day | ORAL | 0 refills | Status: DC | PRN
  Filled 2024-04-02: qty 20, 20d supply, fill #0

## 2024-04-02 NOTE — Patient Instructions (Signed)
 Gaze Stabilization: Sitting    Keeping eyes on target on wall a few feet away, tilt head down 15-30 and move head side to side for __30__ seconds. Repeat while moving head up and down for __30__ seconds.  Perform 3 reps of each  Do __2__ sessions per day.   Gaze Stabilization: Tip Card  1.Target must remain in focus, not blurry, and appear stationary while head is in motion. 2.Perform exercises with small head movements (45 to either side of midline). 3.Increase speed of head motion so long as target is in focus. 4.If you wear eyeglasses, be sure you can see target through lens (therapist will give specific instructions for bifocal / progressive lenses). 5.These exercises may provoke dizziness or nausea. Work through these symptoms. If too dizzy, slow head movement slightly. Rest between each exercise. 6.Exercises demand concentration; avoid distractions.

## 2024-04-02 NOTE — Progress Notes (Signed)
 Patient ID: Linda Cooper, female    DOB: 1961/10/31  MRN: [id number]  CC: Follow-up (ER f/u. Med refills. /Discuss going back to work - requesting clearance letter Linda Cooper mammogram. )   Subjective: Linda Cooper is a 63 y.o. female who presents for ER f/u. Cousin, Candace, is with her. Her concerns today include:  Hx of HTN, DM, morbid obesity and OA knees,   Discussed the use of AI scribe software for clinical note transcription with the patient, who gave verbal consent to proceed.  Discussed the use of AI scribe software for clinical note transcription with the patient, who gave verbal consent to proceed.  History of Present Illness   The patient presents for follow-up after a recent fall at work. She fell out of a chair, landing on her buttocks and then hitting the back of her head on the chair and floor. Initially, she experienced only localized pain at the site of impact and generalized muscle soreness. Two days later, she developed dizziness, described as the world spinning, which prompted an ER visit. Imaging studies, including a CT scan and MRI, were negative for intracranial bleeding. The dizziness has since improved but has not completely resolved. She is currently undergoing physical therapy for what sounds like vestibular training , which has been beneficial. The physical therapist has also recommended the use of a walker for balance. The patient is eager to return to work.  She reports that the dizziness has decreased significantly.  Over the past few days, she experienced 2-3 episodes that lasts only a few seconds.  Soreness at the back of the head has resolved.  She is requesting a refill on meclizine  to use as needed which she finds helpful.  She brings some blood pressure readings with her from my review.  States that her numbers are looking much better since she stopped taking NSAIDs.  Recent readings are 120/64, 128/72, 115/59.     Patient Active Problem List    Diagnosis Date Noted   Hypertension associated with diabetes (HCC) 08/13/2023   Type 2 diabetes mellitus with morbid obesity (HCC) 08/13/2023   Iron deficiency anemia 08/13/2023   Urge incontinence 08/31/2021   Pain, dental 08/16/2021   Trigger middle finger of left hand 11/08/2019   Carpal tunnel syndrome of left wrist 07/09/2019   Morbid obesity with body mass index (BMI) of 50.0 to 59.9 in adult [facility] 10/16/2018   Morbid obesity (HCC) 11/11/2014   PCOS (polycystic ovarian syndrome) 11/10/2014   Degenerative arthritis of knee, bilateral 11/10/2014     Current Outpatient Medications on File Prior to Visit  Medication Sig Dispense Refill   albuterol  (VENTOLIN  HFA) 108 (90 Base) MCG/ACT inhaler Inhale 2 puffs into the lungs every 4 (four) hours as needed for wheezing. 6.7 g 1   amLODipine  (NORVASC ) 5 MG tablet Take 1 tablet (5 mg total) by mouth daily. 90 tablet 1   Blood Glucose Monitoring Suppl (BLOOD GLUCOSE MONITOR SYSTEM) w/Device KIT use daily as directed. 1 kit 0   diazepam  (VALIUM ) 5 MG tablet Take 1 tablet (5 mg total) by mouth 2 (two) times daily. 10 tablet 0   glucose blood (TRUE METRIX BLOOD GLUCOSE TEST) test strip Use to check blood sugar once daily. 100 each 6   hydrALAZINE  (APRESOLINE ) 100 MG tablet Take 1 tablet (100 mg total) by mouth 2 (two) times daily. 180 tablet 3   losartan -hydrochlorothiazide  (HYZAAR ) 100-25 MG tablet TAKE 1 TABLET BY MOUTH DAILY. 90 tablet 1   metFORMIN  (  GLUCOPHAGE -XR) 500 MG 24 hr tablet Take 1 tablet (500 mg total) by mouth daily with breakfast. 90 tablet 1   Multiple Vitamins-Minerals (MULTIVITAMIN WITH MINERALS) tablet Take 1 tablet by mouth daily. (Patient not taking: Reported on 03/12/2024)     NON FORMULARY SEGA PRO BLADDER HEALTH 100 MG DAILY     solifenacin  (VESICARE ) 10 MG tablet Take 1 tablet (10 mg total) by mouth daily. 90 tablet 1   traMADol  (ULTRAM ) 50 MG tablet Take 1 tablet (50 mg total) by mouth every 12 (twelve) hours as needed. 60  tablet 1   TRUEplus Lancets 28G MISC Use to check blood sugar once daily. 100 each 6   No current facility-administered medications on file prior to visit.    Allergies  Allergen Reactions   Ace Inhibitors Cough   Erythromycin Base     Cramping in abdomen is reaction/no rash    Social History   Socioeconomic History   Marital status: Single    Spouse name: Not on file   Number of children: 1    Years of education: college    Highest education level: Not on file  Occupational History   Occupation: Technical brewer    Comment: [employer], in home care,   Tobacco Use   Smoking status: Never   Smokeless tobacco: Never  Vaping Use   Vaping status: Never Used  Substance and Sexual Activity   Alcohol use: No   Drug use: No   Sexual activity: Not Currently  Other Topics Concern   Not on file  Social History Narrative   Lives at home with son.    Son is 44.       She is motivated to lose weight and improve health by the expected death of her father  01/10/17) and the sudden deaths of her cousin (found dead in car one month after death of his wife 03-10-2017)  and his wife (died after childbirth 02/10/17)    Exercise: minimal    Social Drivers of Health   Financial Resource Strain: Medium Risk (02/10/2024)   Overall Financial Resource Strain (CARDIA)    Difficulty of Paying Living Expenses: Somewhat hard  Food Insecurity: No Food Insecurity (02/10/2024)   Hunger Vital Sign    Worried About Running Out of Food in the Last Year: Never true    Ran Out of Food in the Last Year: Never true  Transportation Needs: No Transportation Needs (02/10/2024)   PRAPARE - Administrator, Civil Service (Medical): No    Lack of Transportation (Non-Medical): No  Physical Activity: Inactive (02/10/2024)   Exercise Vital Sign    Days of Exercise per Week: 0 days    Minutes of Exercise per Session: 0 min  Stress: No Stress Concern Present (02/10/2024)   [employer] of Occupational Health  - Occupational Stress Questionnaire    Feeling of Stress : Only a little  Social Connections: Moderately Integrated (02/10/2024)   Social Connection and Isolation Panel [NHANES]    Frequency of Communication with Friends and Family: Three times a week    Frequency of Social Gatherings with Friends and Family: Three times a week    Attends Religious Services: More than 4 times per year    Active Member of Clubs or Organizations: Yes    Attends Banker Meetings: More than 4 times per year    Marital Status: Never married  Intimate Partner Violence: Not At Risk (02/10/2024)   Humiliation, Afraid, Rape, and Kick  questionnaire    Fear of Current or Ex-Partner: No    Emotionally Abused: No    Physically Abused: No    Sexually Abused: No    Family History  Problem Relation Age of Onset   Hypertension Mother    Diabetes Mother    Stroke Mother    Heart disease Mother    Obesity Mother    Hypertension Father    Diabetes Father    Stroke Father    Obesity Father    Heart disease Father    Hyperlipidemia Father    Heart disease Sister    Diabetes Brother    Hypertension Brother    Gout Brother    Breast cancer Cousin    Cancer Neg Hx     Past Surgical History:  Procedure Laterality Date   ABDOMINAL HYSTERECTOMY  2006    HERNIA REPAIR  2006    Umbilical     ROS: Review of Systems Negative except as stated above  PHYSICAL EXAM: BP (!) 153/98 (BP Location: Right Arm, Patient Position: Sitting, Cuff Size: Large)   Pulse 82   Ht 5\' 2"  (1.575 m)   Wt 280 lb (127 kg)   SpO2 98%   BMI 51.21 kg/m   Physical Exam  General appearance - alert, well appearing, obese older African-American female and in no distress Mental status - normal mood, behavior, speech, dress, motor activity, and thought processes Chest - clear to auscultation, no wheezes, rales or rhonchi, symmetric air entry Heart - normal rate, regular rhythm, normal S1, S2, no murmurs, rubs, clicks or  gallops Neurological -cranial nerves are grossly intact.  Power in the upper extremities 5/5 bilaterally.  Power in the lower extremities 4+/5.  Patient gives to pain due to pain in the knees.  She has her walker with her.      Latest Ref Rng & Units 03/05/2024   12:03 PM 02/10/2024    4:46 PM 08/12/2023    4:56 PM  CMP  Glucose 70 - 99 mg/dL 161  96    BUN 8 - 23 mg/dL 19  22    Creatinine 0.96 - 1.00 mg/dL 0.45  4.09    Sodium 811 - 145 mmol/L 137  139    Potassium 3.5 - 5.1 mmol/L 4.2  4.4    Chloride 98 - 111 mmol/L 101  98    CO2 20 - 29 mmol/L  24    Calcium  8.7 - 10.3 mg/dL  9.6    Total Protein 6.0 - 8.5 g/dL   6.5   Total Bilirubin 0.0 - 1.2 mg/dL   0.6   Alkaline Phos 44 - 121 IU/L   80   AST 0 - 40 IU/L   18   ALT 0 - 32 IU/L   15    Lipid Panel     Component Value Date/Time   CHOL 140 08/12/2023 1656   TRIG 109 08/12/2023 1656   HDL 71 08/12/2023 1656   CHOLHDL 2.0 08/12/2023 1656   CHOLHDL 2.0 11/10/2014 1148   VLDL 21 11/10/2014 1148   LDLCALC 50 08/12/2023 1656    CBC    Component Value Date/Time   WBC 5.9 03/05/2024 1154   RBC 4.45 03/05/2024 1154   HGB 12.9 03/05/2024 1203   HGB 11.7 08/12/2023 1656   HCT 38.0 03/05/2024 1203   HCT 37.5 08/12/2023 1656   PLT 373 03/05/2024 1154   PLT 364 08/12/2023 1656   MCV 80.7 03/05/2024 1154   MCV  81 08/12/2023 1656   MCH 25.8 (L) 03/05/2024 1154   MCHC 32.0 03/05/2024 1154   RDW 15.6 (H) 03/05/2024 1154   RDW 16.0 (H) 08/12/2023 1656   LYMPHSABS 2.5 06/24/2022 1015   LYMPHSABS 3.0 12/29/2018 1546   MONOABS 0.7 06/24/2022 1015   EOSABS 0.2 06/24/2022 1015   EOSABS 0.4 12/29/2018 1546   BASOSABS 0.1 06/24/2022 1015   BASOSABS 0.1 12/29/2018 1546    ASSESSMENT AND PLAN: 1. Accident due to mechanical fall without injury, initial encounter (Primary) Patient with recent fall at work out of a chair at which time she had hit her head.  Experiencing some dizziness since then that has frequently improved.  She  will continue to work with physical therapy.  We will hold off on sending her back to work until she has completed therapy.  2. Dizziness See #1 above - meclizine  (ANTIVERT ) 25 MG tablet; Take 1 tablet (25 mg total) by mouth daily as needed for dizziness.  Dispense: 20 tablet; Refill: 0  3. Hypertension associated with type 2 diabetes mellitus (HCC) Home blood pressure readings are good.  Advised to continue to monitor. Continue amlodipine  5 mg daily, hydralazine  100 mg 2 times a day, Hyzaar  100/25 mg daily.  4. Encounter for screening mammogram for malignant neoplasm of breast Referral submitted for MMG  5. Screening for colon cancer Encouraged her to use the fit test that she has at home and bring it in as soon as possible.   I spent 30 minutes dedicated to the care of this patient today including previsit review of chart including recent ER visit and studies done in the emergency room, face-to-face time with patient discussing diagnosis and management and post visit entering of orders.  Patient was given the opportunity to ask questions.  Patient verbalized understanding of the plan and was able to repeat key elements of the plan.   This documentation was completed using Paediatric nurse.  Any transcriptional errors are unintentional.  No orders of the defined types were placed in this encounter.    Requested Prescriptions   Signed Prescriptions Disp Refills   meclizine  (ANTIVERT ) 25 MG tablet 20 tablet 0    Sig: Take 1 tablet (25 mg total) by mouth daily as needed for dizziness.    No follow-ups on file.  Concetta Dee, MD, FACP

## 2024-04-02 NOTE — Therapy (Signed)
 OUTPATIENT PHYSICAL THERAPY VESTIBULAR TREATMENT     Patient Name: Linda Cooper MRN: [id number] DOB:16-Oct-1961, 63 y.o., female Today's Date: 04/02/2024  END OF SESSION:  PT End of Session - 04/02/24 1534     Visit Number 3    Number of Visits 9    Date for PT Re-Evaluation 04/23/24    Authorization Type [employer]    PT Start Time 1532    PT Stop Time 1605   full time not used due to resolution of BPPV   PT Time Calculation (min) 33 min    Activity Tolerance Patient tolerated treatment well    Behavior During Therapy [facility] for tasks assessed/performed              Past Medical History:  Diagnosis Date   'Light-for-dates' infant with signs of fetal malnutrition    Anemia    Arthritis    Asthma    Diabetes mellitus without complication (HCC) Dx 2007   GERD (gastroesophageal reflux disease)    Hypertension Dx 1990   Knee pain, bilateral    Obesity    Rheumatic fever childhood    has intermittent murmur and hand arthritis    Past Surgical History:  Procedure Laterality Date   ABDOMINAL HYSTERECTOMY  2006    HERNIA REPAIR  2006    Umbilical    Patient Active Problem List   Diagnosis Date Noted   Hypertension associated with diabetes (HCC) 08/13/2023   Type 2 diabetes mellitus with morbid obesity (HCC) 08/13/2023   Iron deficiency anemia 08/13/2023   Urge incontinence 08/31/2021   Pain, dental 08/16/2021   Trigger middle finger of left hand 11/08/2019   Carpal tunnel syndrome of left wrist 07/09/2019   Morbid obesity with body mass index (BMI) of 50.0 to 59.9 in adult (HCC) 10/16/2018   Morbid obesity (HCC) 11/11/2014   PCOS (polycystic ovarian syndrome) 11/10/2014   Degenerative arthritis of knee, bilateral 11/10/2014    PCP: Lawrance Presume, MD  REFERRING PROVIDER:  Claudene Crystal, PA-C  REFERRING DIAG: [phone] (ICD-10-CM) - Concussion without loss of consciousness, subsequent encounter R42 (ICD-10-CM) - Dizziness    THERAPY DIAG:   BPPV (benign paroxysmal positional vertigo), right  Dizziness and giddiness  Unsteadiness on feet  ONSET DATE: 03/12/2024  Rationale for Evaluation and Treatment: Rehabilitation  SUBJECTIVE:   SUBJECTIVE STATEMENT: Felt dizzy 2 times on Wednesday and this morning and it only seemed to last a few seconds. Saw her PCP this morning and told her it was good that she was holding off on the Meclizine . Have not taken one since Tuesday. Had an episode this morning when she felt off balance. Has not had any of the spinning. Kept a book and wrote down when she got dizzy, but she left it in the car. Notes light is not bothering her as much. Still can't look at something that is moving pretty fast.    Pt accompanied by:  Daved Eriksson  PERTINENT HISTORY:   Per PCP:  [name] last Wednesday (03/03/24)- was at work sitting in a chair and the chair moved and she fell backwards and fell back hitting her head, was not having issues until last Friday and went to ER on 03/05/24 due to dizziness, MRI and CT was normal. ER doctor said it was concussion and vertigo combined. Was given meclizine  and pain medication. Her Dizziness got worse when they adminstered the constrast for CT. Meclizine  is helping but not fast enough  The dizziness continued for several days  after the fall. She still has some dizziness but it comes and goes. She feels little off balance. If she leans her head forward it is worse.   PMH: Diabetes, anemia, HTN, bilateral knee pain, obesity   PAIN:  Are you having pain? No  There were no vitals filed for this visit.  Taken on L upper arm and then taken on R forearm   PRECAUTIONS: Fall  FALLS: Has patient fallen in last 6 months? Yes. 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  DIAGNOSTIC FINDINGS: Brain MRI 03/05/2024: IMPRESSION: Negative brain MRI   CT cervical spine 03/05/2024 and CT angiogram head: IMPRESSION: 1. No acute intracranial abnormality. 2. No large vessel occlusion, hemodynamically significant stenosis, or evidence of acute arterial injury in the head or neck. 3. Multinodular thyroid .  COGNITION: Overall cognitive status: Within functional limits for tasks assessed    POSTURE:  rounded shoulders, forward head, and posterior pelvic tilt  Cervical ROM:   AROM WFL, moves very slowly and guarded with EC   TRANSFERS: Assistive device utilized: None, has RW in front of patient  Sit to stand: SBA Stand to sit: SBA Pt using BUE support from chair to stand, has RW in front of pt   GAIT: Gait pattern: decreased stride length and decreased trunk rotation, unsteadiness, guarded with gait and does not turn head  Distance walked: clinic distances  Assistive device utilized: Single point cane, Walker - 2 wheeled, and Grab bars Level of assistance: SBA and CGA Comments: Pt ambulates in with SPC and holding onto the wall, PT had to go grab RW for pt to ambulate back into clinic with, pt ambulates with slow gait speed due to dizziness    VESTIBULAR ASSESSMENT:  GENERAL OBSERVATION: Ambulates in to clinic with SPC and holding onto walls, PT providing pt with clinic RW due to unsteadiness to ambulate back into session, pt ambulates slowly    SYMPTOM BEHAVIOR:  Subjective history: See above   Non-Vestibular symptoms: neck pain  Type of dizziness: Spinning/Vertigo  Frequency:  Daily, gotten better the past couple of days   Duration: lasts about a minute when laying down on R side   Aggravating factors: Induced by position change: laying down on R side and Induced by motion: bending down to the ground and turning head quickly fast movement on a TV  Relieving factors: closing eyes, trying to talk herself through it   Progression of symptoms: better  OCULOMOTOR EXAM:  Ocular Alignment: normal  Ocular ROM: No Limitations  Spontaneous Nystagmus: absent  Gaze-Induced Nystagmus: absent  Smooth Pursuits: intact  Saccades:  ~2-3 saccadic beats to R with weird sensation, ~2 saccadic beats in vertical direction  Convergence/Divergence: 6 cm   VESTIBULAR - OCULAR REFLEX:   Slow VOR: Normal  VOR Cancellation: Normal  Head-Impulse Test: HIT Right: positive HIT Left: negative Pt reporting some dizziness going to the R side, pt initially closing her eyes when assessing  to the L    POSITIONAL TESTING: Right Dix-Hallpike: upbeating, right nystagmus and pt immediately closing her eyes, so hard to tell duration, lasts about 15-20 seconds  Performed with 2 pillows behind pt, pt taking incr time to get into long sitting position before assessing due to body habitus     [place] Post Concussion Symptoms Questionnaire:  Compared to before the accident, do you now (last 24 hours) suffer from:  Headaches 0 = not experienced  Feeling of Dizziness 4 = severe problem  Nausea and/or vomiting 4 = severe problem  RPQ-3 (total of first 3 items) 8     Noise sensitivity (easily upset by loud noises) 4 = severe problem  Sleep disturbances 4 = severe problem  Fatigue, tiring more easily 3 = moderate problem  Being irritable, easily angered: 4 = severe problem  Feeling depressed or tearful 3 = moderate problem  Feeling frustrated or impatient 3 = moderate problem  Forgetfulness, poor memory 4 = severe problem  Poor concentration 0 = not experienced  Taking longer to think 3 =  moderate problem  Blurred vision 4 = severe problem  Light sensitivity (easily upset by bright light) 3 = moderate problem  Double vision 4 = severe problem  Restlessness 4 = severe problem  RPQ-13 (total for next 13 items) 43                                                                                                                              TREATMENT DATE: 04/02/24   POSITIONAL TESTING: Right Dix-Hallpike: no nystagmus Right Sidelying: no nystagmus Left Sidelying: no nystagmus  Pt with no dizziness in positions   MOTION SENSITIVITY:    Motion Sensitivity Quotient  Intensity: 0 = none, 1 = Lightheaded, 2 = Mild, 3 = Moderate, 4 = Severe, 5 = Vomiting  Intensity  1. Sitting to supine   2. Supine to L side   3. Supine to R side   4. Supine to sitting   5. L Hallpike-Dix   6. Up from L    7. R Hallpike-Dix 0  8. Up from R  0  9. Sitting, head  tipped to L knee 0  10. Head up from L  knee 0  11. Sitting, head  tipped to R knee 0  12. Head up from R  knee 0  13. Sitting head turns x5 0  14.Sitting head nods x5 1 (pt reporting a sinking sensation when looking down, only lasted a second or 2, no spinning)  15. In stance, 180  turn to L    16. In stance, 180  turn to R      Re-tested saccades and VOR: Saccades: WNL, no dizziness  VOR: slightly positive to R, negative to L, pt reporting some dizziness    NMR: Gaze Adaptation: x1 Viewing Horizontal: Position: Sitting, Time: 30 seconds, Reps: 2, and Comment: pt reporting a little bit of dizziness when going  to the R, reports the 2nd time it got harder to focus on it, but no dizziness per say x1 Viewing Vertical:  Position: Sitting, Time: 30 seconds, Reps: 2, and Comment: just a second or two of dizziness when looking down, no dizziness after the 2nd rep   Performed another 5 reps of head nods at end of session with no symptoms    PATIENT EDUCATION: Education details: discussed resolution of BPPV at this  time, added gaze stabilization exercises to HEP due to pt with slightly positive VOR to R and some dizziness Person educated: Patient and pt's cousin Radio broadcast assistant Education method: Explanation, Demonstration, Handouts  Education comprehension: verbalized understanding, returned demonstration, and needs further education  HOME EXERCISE PROGRAM: Seated gaze stabilization for 30 seconds in horizontal and vertical directions   GOALS: Goals reviewed with patient? Yes  SHORT TERM GOALS: ALL STGS = LTGS  LONG TERM GOALS: Target date: 04/21/2024  Pt will be independent with final HEP for vestibular deficits/dizziness in order to build upon functional gains made in therapy. Baseline: will provide at future session if needed  Goal status: INITIAL  2.  Further vestibular testing to be assessed with goal written after resolution of BPPV. Baseline: will assess after resolution of R posterior canal BPPV  Goal status: INITIAL  3.  Pt will demo negative positional testing in order to demo decr dizziness in daily life.  Baseline: (+) R posterior canalithiasis  Goal status: INITIAL  4.  Pt will decr RPQ-3 to 4 or less and RPQ-13 to 30 or less in order to demo improved symptoms.  Baseline: RPQ-3 = 8, RPQ-13 = 43 Goal status: INITIAL    ASSESSMENT:  CLINICAL IMPRESSION: Re-assessed positional testing today with pt negative for BPPV and had no dizziness/nystagmus in positions. With MSQ testing, pt only with very slight dizziness with vertical head nods. With re-assessment of saccades compared to eval, they were now [facility] and pt had no dizziness. With VOR testing, pt with slightly positive test to the R and with some dizziness. Added gaze stabilization to HEP with pt having some mild dizziness, but it improved with each rep. Re-assess head nods at end of session with pt with no dizziness. Will continue per POC.    OBJECTIVE IMPAIRMENTS: Abnormal gait, decreased activity tolerance, decreased balance,  decreased mobility, difficulty walking, decreased ROM, dizziness, postural dysfunction, and obesity.   ACTIVITY LIMITATIONS: carrying, lifting, bending, standing, stairs, transfers, bed mobility, locomotion level, and caring for others  PARTICIPATION LIMITATIONS: laundry, driving, shopping, community activity, occupation, and yard work  PERSONAL FACTORS: Age, Behavior pattern, Past/current experiences, Time since onset of injury/illness/exacerbation, and 3+ comorbidities: Diabetes, anemia, HTN, bilateral knee pain, obesity   are also affecting patient's functional outcome.   REHAB POTENTIAL: Good  CLINICAL DECISION MAKING: Evolving/moderate complexity  EVALUATION COMPLEXITY: Moderate   PLAN:  PT FREQUENCY: 1-2x/week  PT DURATION: 4 weeks  PLANNED INTERVENTIONS: 97164- PT Re-evaluation, 97110-Therapeutic exercises, 97530- Therapeutic activity, 97112- Neuromuscular re-education, 97535- Self Care, 91478- Manual therapy, [id number]- Gait training, [phone]- Canalith repositioning, Patient/Family education, Balance training, Vestibular training, and DME instructions  PLAN FOR NEXT SESSION: Recheck Rt BPPV (should be resolved based on treatment results on 03-30-24);  any other dizziness episodes?    Seabron Cypress, PT, DPT 04/02/2024, 4:16 PM

## 2024-04-06 ENCOUNTER — Ambulatory Visit: Admitting: Physical Therapy

## 2024-04-06 ENCOUNTER — Encounter: Payer: Self-pay | Admitting: Physical Therapy

## 2024-04-06 DIAGNOSIS — R42 Dizziness and giddiness: Secondary | ICD-10-CM

## 2024-04-06 DIAGNOSIS — H8111 Benign paroxysmal vertigo, right ear: Secondary | ICD-10-CM

## 2024-04-06 DIAGNOSIS — R2681 Unsteadiness on feet: Secondary | ICD-10-CM

## 2024-04-06 NOTE — Therapy (Signed)
 OUTPATIENT PHYSICAL THERAPY VESTIBULAR TREATMENT     Patient Name: Linda Cooper MRN: [id number] DOB:[birth year], 63 y.o., female Today's Date: 04/06/2024  END OF SESSION:  PT End of Session - 04/06/24 1448     Visit Number 4    Number of Visits 9    Date for PT Re-Evaluation 04/23/24    Authorization Type [employer]    PT Start Time 1445    PT Stop Time 1525    PT Time Calculation (min) 40 min    Activity Tolerance Patient tolerated treatment well    Behavior During Therapy [facility] for tasks assessed/performed              Past Medical History:  Diagnosis Date   'Light-for-dates' infant with signs of fetal malnutrition    Anemia    Arthritis    Asthma    Diabetes mellitus without complication (HCC) Dx 2007   GERD (gastroesophageal reflux disease)    Hypertension Dx 1990   Knee pain, bilateral    Obesity    Rheumatic fever childhood    has intermittent murmur and hand arthritis    Past Surgical History:  Procedure Laterality Date   ABDOMINAL HYSTERECTOMY  2006    HERNIA REPAIR  2006    Umbilical    Patient Active Problem List   Diagnosis Date Noted   Hypertension associated with diabetes (HCC) 08/13/2023   Type 2 diabetes mellitus with morbid obesity (HCC) 08/13/2023   Iron deficiency anemia 08/13/2023   Urge incontinence 08/31/2021   Pain, dental 08/16/2021   Trigger middle finger of left hand 11/08/2019   Carpal tunnel syndrome of left wrist 07/09/2019   Morbid obesity with body mass index (BMI) of 50.0 to 59.9 in adult (HCC) 10/16/2018   Morbid obesity (HCC) 11/11/2014   PCOS (polycystic ovarian syndrome) 11/10/2014   Degenerative arthritis of knee, bilateral 11/10/2014    PCP: Lawrance Presume, MD  REFERRING PROVIDER:  Claudene Crystal, PA-C  REFERRING DIAG: [phone] (ICD-10-CM) - Concussion without loss of consciousness, subsequent encounter R42 (ICD-10-CM) - Dizziness    THERAPY DIAG:  BPPV (benign paroxysmal positional vertigo),  right  Dizziness and giddiness  Unsteadiness on feet  ONSET DATE: 03/12/2024  Rationale for Evaluation and Treatment: Rehabilitation  SUBJECTIVE:   SUBJECTIVE STATEMENT: Reports exercises went well. Only got dizzy twice since she was last here from bending down to get something on the floor. As she was coming back up, looked up and felt a little dizzy. Lasted a few seconds, just felt a sensation that the floor was coming up. Reports it didn't last long and it didn't happen again. Has not had any episodes of this since Sunday. Has been having no episodes when coming up from the bed. Has been using her cane in the house, but L knee was very bothersome and brought her RW as she would rather be safe than sorry. Took a Meclizine  on Sunday morning after bending over.    Pt accompanied by:  Daved Eriksson  PERTINENT HISTORY:   Per PCP:  [name] last Wednesday (03/03/24)- was at work sitting in a chair and the chair moved and she fell backwards and fell back hitting her head, was not having issues until last Friday and went to ER on 03/05/24 due to dizziness, MRI and CT was normal. ER doctor said it was concussion and vertigo combined. Was given meclizine  and pain medication. Her Dizziness got worse when they adminstered the constrast for CT. Meclizine  is helping but not  fast enough  The dizziness continued for several days after the fall. She still has some dizziness but it comes and goes. She feels little off balance. If she leans her head forward it is worse.   PMH: Diabetes, anemia, HTN, bilateral knee pain, obesity   PAIN:  Are you having pain? Reports 9/10 bilateral knee pain    PRECAUTIONS: Fall  FALLS: Has patient fallen in last 6 months? Yes. Number of falls 1  LIVING ENVIRONMENT: Lives with: lives with their son Lives in: House/apartment Stairs: 1 floor, has a step to get from den to [employer] or to laundry room  Has following equipment at home: Single point cane, Environmental consultant - 2  wheeled, shower chair, and Ramped entry  PLOF: Independent with community mobility with device, Vocation/Vocational requirements: Does in-home visits for Medicaid, and Leisure: likes to crochet  Currently not driving   PATIENT GOALS: Wants to not fall again and wants dizziness to go away   OBJECTIVE:  Note: Objective measures were completed at Evaluation unless otherwise noted.  DIAGNOSTIC FINDINGS: Brain MRI 03/05/2024: IMPRESSION: Negative brain MRI   CT cervical spine 03/05/2024 and CT angiogram head: IMPRESSION: 1. No acute intracranial abnormality. 2. No large vessel occlusion, hemodynamically significant stenosis, or evidence of acute arterial injury in the head or neck. 3. Multinodular thyroid .  COGNITION: Overall cognitive status: Within functional limits for tasks assessed    POSTURE:  rounded shoulders, forward head, and posterior pelvic tilt  Cervical ROM:   AROM WFL, moves very slowly and guarded with EC   TRANSFERS: Assistive device utilized: None, has RW in front of patient  Sit to stand: SBA Stand to sit: SBA Pt using BUE support from chair to stand, has RW in front of pt   GAIT: Gait pattern: decreased stride length and decreased trunk rotation, unsteadiness, guarded with gait and does not turn head  Distance walked: clinic distances  Assistive device utilized: Single point cane, Walker - 2 wheeled, and Grab bars Level of assistance: SBA and CGA Comments: Pt ambulates in with SPC and holding onto the wall, PT had to go grab RW for pt to ambulate back into clinic with, pt ambulates with slow gait speed due to dizziness    VESTIBULAR ASSESSMENT:  GENERAL OBSERVATION: Ambulates in to clinic with SPC and holding onto walls, PT providing pt with clinic RW due to unsteadiness to ambulate back into session, pt ambulates slowly    SYMPTOM BEHAVIOR:  Subjective history: See above   Non-Vestibular symptoms: neck pain  Type of dizziness:  Spinning/Vertigo  Frequency: Daily, gotten better the past couple of days   Duration: lasts about a minute when laying down on R side   Aggravating factors: Induced by position change: laying down on R side and Induced by motion: bending down to the ground and turning head quickly fast movement on a TV  Relieving factors: closing eyes, trying to talk herself through it   Progression of symptoms: better  OCULOMOTOR EXAM:  Ocular Alignment: normal  Ocular ROM: No Limitations  Spontaneous Nystagmus: absent  Gaze-Induced Nystagmus: absent  Smooth Pursuits: intact  Saccades:  ~2-3 saccadic beats to R with weird sensation, ~2 saccadic beats in vertical direction  Convergence/Divergence: 6 cm   VESTIBULAR - OCULAR REFLEX:   Slow VOR: Normal  VOR Cancellation: Normal  Head-Impulse Test: HIT Right: positive HIT Left: negative Pt reporting some dizziness going to the R side, pt initially closing her eyes when assessing to the L    POSITIONAL  TESTING: Right Dix-Hallpike: upbeating, right nystagmus and pt immediately closing her eyes, so hard to tell duration, lasts about 15-20 seconds  Performed with 2 pillows behind pt, pt taking incr time to get into long sitting position before assessing due to body habitus     [place] Post Concussion Symptoms Questionnaire:  Compared to before the accident, do you now (last 24 hours) suffer from:  Headaches 0 = not experienced  Feeling of Dizziness 4 = severe problem  Nausea and/or vomiting 4 = severe problem  RPQ-3 (total of first 3 items) 8     Noise sensitivity (easily upset by loud noises) 4 = severe problem  Sleep disturbances 4 = severe problem  Fatigue, tiring more easily 3 = moderate problem  Being irritable, easily angered: 4 = severe problem  Feeling depressed or tearful 3 = moderate problem  Feeling frustrated or impatient 3 = moderate problem  Forgetfulness, poor memory 4 = severe problem  Poor concentration 0 = not experienced   Taking longer to think 3 = moderate problem  Blurred vision 4 = severe problem  Light sensitivity (easily upset by bright light) 3 = moderate problem  Double vision 4 = severe problem  Restlessness 4 = severe problem  RPQ-13 (total for next 13 items) 43                                                                                                                              TREATMENT DATE: 04/06/24  Therapeutic Activity:   POSITIONAL TESTING: Right Dix-Hallpike: no nystagmus Right Sidelying: no nystagmus  Pt with no dizziness in positions, assessed each 2 times.   Performed seated bending with looking at where she is bending over and picking up an object, performed with 3 cones on R and L on 3, performed 3 sets with pt having no dizziness, on 2nd and 3rd reps, had pt bend over farther to the floor, still with no symptoms. Unable to perform in standing with RW due to pt having incr knee pain   In seated:  Performed head turns 5 reps: felt a little weird but not dizzy, performed an additional 5 reps with no dizziness or symptoms  Performed head nods 5 reps: pt reports no sensations of dizziness   In standing: 3 x 5 reps head turns, no dizziness, but pt felt like she was turning her head too fast, pt reporting feeling a little dizzy after the 3rd rep 2 x 5 reps head nods, pt reporting feeling more dizzy when looking down      PATIENT EDUCATION: Education details: discussed resolution of BPPV at this time, unable to reproduce pt's dizziness except with standing head motions, so added to HEP for habituation while standing at RW, discussed continuing use of VOR exercises and trying to do more at home  Person educated: Patient and pt's cousin Candace Education method: Explanation, Demonstration, Handouts  Education comprehension: verbalized understanding, returned demonstration, and needs further education  HOME EXERCISE PROGRAM: Seated gaze stabilization for 30 seconds in horizontal  and vertical directions   Standing head turns 5 reps and head nods 5 reps at RW for habituation    GOALS: Goals reviewed with patient? Yes  SHORT TERM GOALS: ALL STGS = LTGS  LONG TERM GOALS: Target date: 04/21/2024  Pt will be independent with final HEP for vestibular deficits/dizziness in order to build upon functional gains made in therapy. Baseline: will provide at future session if needed  Goal status: INITIAL  2.  Further vestibular testing to be assessed with goal written after resolution of BPPV. Baseline: will assess after resolution of R posterior canal BPPV  Goal status: INITIAL  3.  Pt will demo negative positional testing in order to demo decr dizziness in daily life.  Baseline: (+) R posterior canalithiasis  Goal status: INITIAL  4.  Pt will decr RPQ-3 to 4 or less and RPQ-13 to 30 or less in order to demo improved symptoms.  Baseline: RPQ-3 = 8, RPQ-13 = 43 Goal status: INITIAL    ASSESSMENT:  CLINICAL IMPRESSION: Since pt was last here, pt reported a couple episodes of very brief dizziness when bending over at home. Re-assessed R posterior canal BPPV multiple times with pt negative for nystagmus/dizziness in R DixHallpike and R sidelying positions. Tried to reproduce pt's dizziness with bending over, but pt with no dizziness. Pt only with dizziness with standing head motions at RW. Added to HEP for habituation with pt able to tolerate well. Will continue per POC.    OBJECTIVE IMPAIRMENTS: Abnormal gait, decreased activity tolerance, decreased balance, decreased mobility, difficulty walking, decreased ROM, dizziness, postural dysfunction, and obesity.   ACTIVITY LIMITATIONS: carrying, lifting, bending, standing, stairs, transfers, bed mobility, locomotion level, and caring for others  PARTICIPATION LIMITATIONS: laundry, driving, shopping, community activity, occupation, and yard work  PERSONAL FACTORS: Age, Behavior pattern, Past/current experiences, Time since  onset of injury/illness/exacerbation, and 3+ comorbidities: Diabetes, anemia, HTN, bilateral knee pain, obesity   are also affecting patient's functional outcome.   REHAB POTENTIAL: Good  CLINICAL DECISION MAKING: Evolving/moderate complexity  EVALUATION COMPLEXITY: Moderate   PLAN:  PT FREQUENCY: 1-2x/week  PT DURATION: 4 weeks  PLANNED INTERVENTIONS: 97164- PT Re-evaluation, 97110-Therapeutic exercises, 97530- Therapeutic activity, 97112- Neuromuscular re-education, 97535- Self Care, 25366- Manual therapy, [id number]- Gait training, [phone]- Canalith repositioning, Patient/Family education, Balance training, Vestibular training, and DME instructions  PLAN FOR NEXT SESSION: Recheck Rt BPPV (should be resolved based on treatment results on 03-30-24);  any other dizziness episodes?    Seabron Cypress, PT, DPT 04/06/2024, 4:50 PM

## 2024-04-09 ENCOUNTER — Telehealth: Payer: Self-pay | Admitting: Orthopedic Surgery

## 2024-04-09 ENCOUNTER — Encounter: Payer: Self-pay | Admitting: Physical Therapy

## 2024-04-09 ENCOUNTER — Ambulatory Visit: Attending: Medical | Admitting: Physical Therapy

## 2024-04-09 DIAGNOSIS — R42 Dizziness and giddiness: Secondary | ICD-10-CM | POA: Diagnosis present

## 2024-04-09 DIAGNOSIS — R2681 Unsteadiness on feet: Secondary | ICD-10-CM | POA: Diagnosis present

## 2024-04-09 NOTE — Telephone Encounter (Signed)
Okay by me.

## 2024-04-09 NOTE — Telephone Encounter (Signed)
 Pt would like to get auth for the gel injections.   If approved she would like have the gel instead of the cortisone at her already scheduled appt for 04/26/24.

## 2024-04-09 NOTE — Therapy (Signed)
 OUTPATIENT PHYSICAL THERAPY VESTIBULAR TREATMENT     Patient Name: Linda Cooper MRN: [id number] DOB:Oct 16, 1961, 63 y.o., female Today's Date: 04/09/2024  END OF SESSION:  PT End of Session - 04/09/24 1448     Visit Number 5    Number of Visits 9    Date for PT Re-Evaluation 04/23/24    Authorization Type [place] MEDICAID [employer] (as of 04/09/24)    PT Start Time 1446    PT Stop Time 1510   full time not used due to resolution of dizziness   PT Time Calculation (min) 24 min    Activity Tolerance Patient tolerated treatment well    Behavior During Therapy [facility] for tasks assessed/performed              Past Medical History:  Diagnosis Date   'Light-for-dates' infant with signs of fetal malnutrition    Anemia    Arthritis    Asthma    Diabetes mellitus without complication (HCC) Dx 2007   GERD (gastroesophageal reflux disease)    Hypertension Dx 1990   Knee pain, bilateral    Obesity    Rheumatic fever childhood    has intermittent murmur and hand arthritis    Past Surgical History:  Procedure Laterality Date   ABDOMINAL HYSTERECTOMY  2006    HERNIA REPAIR  2006    Umbilical    Patient Active Problem List   Diagnosis Date Noted   Hypertension associated with diabetes (HCC) 08/13/2023   Type 2 diabetes mellitus with morbid obesity (HCC) 08/13/2023   Iron deficiency anemia 08/13/2023   Urge incontinence 08/31/2021   Pain, dental 08/16/2021   Trigger middle finger of left hand 11/08/2019   Carpal tunnel syndrome of left wrist 07/09/2019   Morbid obesity with body mass index (BMI) of 50.0 to 59.9 in adult (HCC) 10/16/2018   Morbid obesity (HCC) 11/11/2014   PCOS (polycystic ovarian syndrome) 11/10/2014   Degenerative arthritis of knee, bilateral 11/10/2014    PCP: Lawrance Presume, MD  REFERRING PROVIDER:  Claudene Crystal, PA-C  REFERRING DIAG: [phone] (ICD-10-CM) - Concussion without loss of consciousness, subsequent encounter R42 (ICD-10-CM) -  Dizziness    THERAPY DIAG:  Dizziness and giddiness  Unsteadiness on feet  ONSET DATE: 03/12/2024  Rationale for Evaluation and Treatment: Rehabilitation  SUBJECTIVE:   SUBJECTIVE STATEMENT: Pt reports no dizziness since she was last here. Did her exercises and everything went well. No dizziness with bending down to tie her shoes and none when turning her head (even when going quickly). Main limiting factor is her knees with her walking, esp with the weather coming through. Knee is hurting so bad and has to use the walker rather than her cane. Reports sometimes will feel sloshing in the back of her head, does not think its dizziness. Going to be getting knee injections in the next couple of weeks - May 19th is her next scheduled appt. Hasn't taken any more Meclizine . Went back to crochet class yesterday and it went well.    Pt accompanied by:  Self  PERTINENT HISTORY:   Per PCP:  [name] last Wednesday (03/03/24)- was at work sitting in a chair and the chair moved and she fell backwards and fell back hitting her head, was not having issues until last Friday and went to ER on 03/05/24 due to dizziness, MRI and CT was normal. ER doctor said it was concussion and vertigo combined. Was given meclizine  and pain medication. Her Dizziness got worse when they adminstered  the constrast for CT. Meclizine  is helping but not fast enough  The dizziness continued for several days after the fall. She still has some dizziness but it comes and goes. She feels little off balance. If she leans her head forward it is worse.   PMH: Diabetes, anemia, HTN, bilateral knee pain, obesity   PAIN:  Are you having pain? Reports 9/10 bilateral knee pain    PRECAUTIONS: Fall  FALLS: Has patient fallen in last 6 months? Yes. Number of falls 1  LIVING ENVIRONMENT: Lives with: lives with their son Lives in: House/apartment Stairs: 1 floor, has a step to get from den to [employer] or to laundry room  Has following  equipment at home: Single point cane, Environmental consultant - 2 wheeled, shower chair, and Ramped entry  PLOF: Independent with community mobility with device, Vocation/Vocational requirements: Does in-home visits for Medicaid, and Leisure: likes to crochet  Currently not driving   PATIENT GOALS: Wants to not fall again and wants dizziness to go away   OBJECTIVE:  Note: Objective measures were completed at Evaluation unless otherwise noted.  DIAGNOSTIC FINDINGS: Brain MRI 03/05/2024: IMPRESSION: Negative brain MRI   CT cervical spine 03/05/2024 and CT angiogram head: IMPRESSION: 1. No acute intracranial abnormality. 2. No large vessel occlusion, hemodynamically significant stenosis, or evidence of acute arterial injury in the head or neck. 3. Multinodular thyroid .  COGNITION: Overall cognitive status: Within functional limits for tasks assessed    POSTURE:  rounded shoulders, forward head, and posterior pelvic tilt  Cervical ROM:   AROM WFL, moves very slowly and guarded with EC   TRANSFERS: Assistive device utilized: None, has RW in front of patient  Sit to stand: SBA Stand to sit: SBA Pt using BUE support from chair to stand, has RW in front of pt   GAIT: Gait pattern: decreased stride length and decreased trunk rotation, unsteadiness, guarded with gait and does not turn head  Distance walked: clinic distances  Assistive device utilized: Single point cane, Walker - 2 wheeled, and Grab bars Level of assistance: SBA and CGA Comments: Pt ambulates in with SPC and holding onto the wall, PT had to go grab RW for pt to ambulate back into clinic with, pt ambulates with slow gait speed due to dizziness    VESTIBULAR ASSESSMENT:  GENERAL OBSERVATION: Ambulates in to clinic with SPC and holding onto walls, PT providing pt with clinic RW due to unsteadiness to ambulate back into session, pt ambulates slowly    SYMPTOM BEHAVIOR:  Subjective history: See above   Non-Vestibular symptoms: neck  pain  Type of dizziness: Spinning/Vertigo  Frequency: Daily, gotten better the past couple of days   Duration: lasts about a minute when laying down on R side   Aggravating factors: Induced by position change: laying down on R side and Induced by motion: bending down to the ground and turning head quickly fast movement on a TV  Relieving factors: closing eyes, trying to talk herself through it   Progression of symptoms: better  OCULOMOTOR EXAM:  Ocular Alignment: normal  Ocular ROM: No Limitations  Spontaneous Nystagmus: absent  Gaze-Induced Nystagmus: absent  Smooth Pursuits: intact  Saccades:  ~2-3 saccadic beats to R with weird sensation, ~2 saccadic beats in vertical direction  Convergence/Divergence: 6 cm   VESTIBULAR - OCULAR REFLEX:   Slow VOR: Normal  VOR Cancellation: Normal  Head-Impulse Test: HIT Right: positive HIT Left: negative Pt reporting some dizziness going to the R side, pt initially closing her eyes  when assessing to the L    POSITIONAL TESTING: Right Dix-Hallpike: upbeating, right nystagmus and pt immediately closing her eyes, so hard to tell duration, lasts about 15-20 seconds  Performed with 2 pillows behind pt, pt taking incr time to get into long sitting position before assessing due to body habitus                                                                                                                              TREATMENT DATE: 04/09/24  Therapeutic Activity:  [place] Post Concussion Symptoms Questionnaire:  Compared to before the accident, do you now (last 24 hours) suffer from:  Headaches 1 = no more of a problem  Feeling of Dizziness 0 = not experienced  Nausea and/or vomiting 0 = not experienced  RPQ-3 (total of first 3 items) 1     Noise sensitivity (easily upset by loud noises) 0 = not experienced  Sleep disturbances 0 = not experienced  Fatigue, tiring more easily 2 = mild problem  Being irritable, easily angered: 0 = not  experienced  Feeling depressed or tearful 0 = not experienced  Feeling frustrated or impatient 2 = mild problem  Forgetfulness, poor memory 1 = no more of a problem  Poor concentration 1 = no more of a problem  Taking longer to think 1 = no more of a problem  Blurred vision 0 = not experienced  Light sensitivity (easily upset by bright light) 1 = no more of a problem  Double vision 0 = not experienced  Restlessness 0 = not experienced  RPQ-13 (total for next 13 items) 10   Discussed POC going forwards - will cancel next Monday's appt as pt no longer having dizziness and has been consistent with HEP, will keep next Wednesday's appt just in case dizziness returns and if it doesn't then can call and cancel, results of goals, discussed reoccurrence rates of BPPV and if it does return in the future, then will need a new referral to return    PATIENT EDUCATION: Education details: See above Person educated: Patient  Education method: Explanation, Demonstration Education comprehension: verbalized understanding, returned demonstration, and needs further education  HOME EXERCISE PROGRAM: Seated gaze stabilization for 30 seconds in horizontal and vertical directions   Standing head turns 5 reps and head nods 5 reps at RW for habituation    GOALS: Goals reviewed with patient? Yes  SHORT TERM GOALS: ALL STGS = LTGS  LONG TERM GOALS: Target date: 04/21/2024  Pt will be independent with final HEP for vestibular deficits/dizziness in order to build upon functional gains made in therapy. Baseline: will provide at future session if needed  Goal status: MET  2.  Further vestibular testing to be assessed with goal written after resolution of BPPV. Baseline: N/A Goal status: N/A  3.  Pt will demo negative positional testing in order to demo decr dizziness in daily life.  Baseline: (+) R posterior canalithiasis  Pt negative for BPPV  Goal status: MET  4.  Pt will decr RPQ-3 to 4 or less and  RPQ-13 to 30 or less in order to demo improved symptoms.  Baseline: RPQ-3 = 8, RPQ-13 = 43  RPQ-3 = 1, RPQ-13 = 10 on 5/2 Goal status: MET    ASSESSMENT:  CLINICAL IMPRESSION: Pt arrives to therapy reporting no other dizziness episodes since she was last here. Pt has been compliant with HEP. Pt is still on RW compared to cane, but that is due to knee pain instead of dizziness. Pt planning on getting knee injections soon. Pt has met all LTGs and is negative for BPPV and made significant improves in score of RPQ (see above for more details). Will cancel Monday's appt and keep Wednesdays just in case and if dizziness remains gone, then pt can call and cancel. Pt in agreement with plan.    OBJECTIVE IMPAIRMENTS: Abnormal gait, decreased activity tolerance, decreased balance, decreased mobility, difficulty walking, decreased ROM, dizziness, postural dysfunction, and obesity.   ACTIVITY LIMITATIONS: carrying, lifting, bending, standing, stairs, transfers, bed mobility, locomotion level, and caring for others  PARTICIPATION LIMITATIONS: laundry, driving, shopping, community activity, occupation, and yard work  PERSONAL FACTORS: Age, Behavior pattern, Past/current experiences, Time since onset of injury/illness/exacerbation, and 3+ comorbidities: Diabetes, anemia, HTN, bilateral knee pain, obesity   are also affecting patient's functional outcome.   REHAB POTENTIAL: Good  CLINICAL DECISION MAKING: Evolving/moderate complexity  EVALUATION COMPLEXITY: Moderate   PLAN:  PT FREQUENCY: 1-2x/week  PT DURATION: 4 weeks  PLANNED INTERVENTIONS: 97164- PT Re-evaluation, 97110-Therapeutic exercises, 97530- Therapeutic activity, [id number]- Neuromuscular re-education, 97535- Self Care, 16109- Manual therapy, [id number]- Gait training, [phone]- Canalith repositioning, Patient/Family education, Balance training, Vestibular training, and DME instructions  PLAN FOR NEXT SESSION: any other dizziness?    Seabron Cypress, PT, DPT 04/09/2024, 3:25 PM

## 2024-04-12 ENCOUNTER — Encounter: Admitting: Physical Therapy

## 2024-04-12 NOTE — Telephone Encounter (Signed)
 Talked with patient and advised her that Medicaid does not cover gel injections.  Advised patient of TriVisc, but patient declined.  Patient stated that she understands and would try the cortisone injections

## 2024-04-13 ENCOUNTER — Other Ambulatory Visit: Payer: Self-pay

## 2024-04-14 ENCOUNTER — Other Ambulatory Visit: Payer: Self-pay

## 2024-04-14 ENCOUNTER — Encounter: Payer: Self-pay | Admitting: Internal Medicine

## 2024-04-14 ENCOUNTER — Encounter: Payer: Self-pay | Admitting: Physical Therapy

## 2024-04-14 ENCOUNTER — Ambulatory Visit: Admitting: Physical Therapy

## 2024-04-14 ENCOUNTER — Telehealth: Payer: Self-pay | Admitting: Internal Medicine

## 2024-04-14 DIAGNOSIS — R42 Dizziness and giddiness: Secondary | ICD-10-CM

## 2024-04-14 DIAGNOSIS — R2681 Unsteadiness on feet: Secondary | ICD-10-CM

## 2024-04-14 NOTE — Telephone Encounter (Signed)
-----   Message from Seabron Cypress sent at 04/14/2024  4:00 PM EDT ----- Regarding: D/C from PT Dr. Lincoln Renshaw,  I have been seeing your patient Linda Cooper for PT s/p concussion and BPPV. Pt is now cleared from BPPV and has graduated from PT due to a significant decr in dizziness. In the past week, she only had one episode that lasted a couple seconds when bending over. Pt is very pleased with her progress from PT in regards to symptoms and no longer needs PT.   Just wanted to let you know.  Thanks, Jonathan Neighbor, PT, DPT 04/14/24 4:02 PM

## 2024-04-14 NOTE — Therapy (Signed)
 OUTPATIENT PHYSICAL THERAPY VESTIBULAR TREATMENT/DISCHARGE SUMMARY     Patient Name: Linda Cooper MRN: [id number] DOB:[birth year], 63 y.o., female Today's Date: 04/14/2024  END OF SESSION:  PT End of Session - 04/14/24 1532     Visit Number 6    Number of Visits 9    Date for PT Re-Evaluation 04/23/24    Authorization Type [place] MEDICAID [employer] (as of 04/09/24)    PT Start Time 1530    PT Stop Time 1545   full time not used due to D/C visit, no dizziness   PT Time Calculation (min) 15 min    Activity Tolerance Patient tolerated treatment well    Behavior During Therapy [facility] for tasks assessed/performed              Past Medical History:  Diagnosis Date   'Light-for-dates' infant with signs of fetal malnutrition    Anemia    Arthritis    Asthma    Diabetes mellitus without complication (HCC) Dx 2007   GERD (gastroesophageal reflux disease)    Hypertension Dx 1990   Knee pain, bilateral    Obesity    Rheumatic fever childhood    has intermittent murmur and hand arthritis    Past Surgical History:  Procedure Laterality Date   ABDOMINAL HYSTERECTOMY  2006    HERNIA REPAIR  2006    Umbilical    Patient Active Problem List   Diagnosis Date Noted   Hypertension associated with diabetes (HCC) 08/13/2023   Type 2 diabetes mellitus with morbid obesity (HCC) 08/13/2023   Iron deficiency anemia 08/13/2023   Urge incontinence 08/31/2021   Pain, dental 08/16/2021   Trigger middle finger of left hand 11/08/2019   Carpal tunnel syndrome of left wrist 07/09/2019   Morbid obesity with body mass index (BMI) of 50.0 to 59.9 in adult (HCC) 10/16/2018   Morbid obesity (HCC) 11/11/2014   PCOS (polycystic ovarian syndrome) 11/10/2014   Degenerative arthritis of knee, bilateral 11/10/2014    PCP: Lawrance Presume, MD  REFERRING PROVIDER:  Claudene Crystal, PA-C  REFERRING DIAG: [phone] (ICD-10-CM) - Concussion without loss of consciousness, subsequent encounter R42  (ICD-10-CM) - Dizziness    THERAPY DIAG:  Dizziness and giddiness  Unsteadiness on feet  ONSET DATE: 03/12/2024  Rationale for Evaluation and Treatment: Rehabilitation  SUBJECTIVE:   SUBJECTIVE STATEMENT: Pt reports one episode of dizziness since she was last here. No other dizziness. Was bending over to pick something up off the floor. Noted this happened yesterday. When coming back up, did feel a little lightheaded. Lasted just a few seconds. Was holding onto something whatever was closest. Has been trying to use the cane more and more. Walks in with cane today and needing to hold onto the wall (this is due to her knees). Gonna see about getting injections in her knees next week.    Pt accompanied by:  Self  PERTINENT HISTORY:   Per PCP:  [name] last Wednesday (03/03/24)- was at work sitting in a chair and the chair moved and she fell backwards and fell back hitting her head, was not having issues until last Friday and went to ER on 03/05/24 due to dizziness, MRI and CT was normal. ER doctor said it was concussion and vertigo combined. Was given meclizine  and pain medication. Her Dizziness got worse when they adminstered the constrast for CT. Meclizine  is helping but not fast enough  The dizziness continued for several days after the fall. She still has some dizziness but  it comes and goes. She feels little off balance. If she leans her head forward it is worse.   PMH: Diabetes, anemia, HTN, bilateral knee pain, obesity   PAIN:  Are you having pain? Reports 9/10 bilateral knee pain    PRECAUTIONS: Fall  FALLS: Has patient fallen in last 6 months? Yes. 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  DIAGNOSTIC FINDINGS: Brain MRI 03/05/2024: IMPRESSION: Negative brain MRI   CT cervical spine 03/05/2024 and CT angiogram head: IMPRESSION: 1. No acute intracranial abnormality. 2. No large vessel occlusion, hemodynamically significant stenosis, or evidence of acute arterial injury in the head or neck. 3. Multinodular thyroid .  COGNITION: Overall cognitive status: Within functional limits for tasks assessed    POSTURE:  rounded shoulders, forward head, and posterior pelvic tilt  Cervical ROM:   AROM WFL, moves very slowly and guarded with EC   TRANSFERS: Assistive device utilized: None, has RW in front of patient  Sit to stand: SBA Stand to sit: SBA Pt using BUE support from chair to stand, has RW in front of pt   GAIT: Gait pattern: decreased stride length and decreased trunk rotation, unsteadiness, guarded with gait and does not turn head  Distance walked: clinic distances  Assistive device utilized: Single point cane, Walker - 2 wheeled, and Grab bars Level of assistance: SBA and CGA Comments: Pt ambulates in with SPC and holding onto the wall, PT had to go grab RW for pt to ambulate back into clinic with, pt ambulates with slow gait speed due to dizziness    VESTIBULAR ASSESSMENT:  GENERAL OBSERVATION: Ambulates in to clinic with SPC and holding onto walls, PT providing pt with clinic RW due to unsteadiness to ambulate back into session, pt ambulates slowly    SYMPTOM BEHAVIOR:  Subjective history: See above   Non-Vestibular symptoms: neck pain  Type of dizziness: Spinning/Vertigo  Frequency: Daily, gotten better the past couple of days   Duration: lasts about a minute when  laying down on R side   Aggravating factors: Induced by position change: laying down on R side and Induced by motion: bending down to the ground and turning head quickly fast movement on a TV  Relieving factors: closing eyes, trying to talk herself through it   Progression of symptoms: better  OCULOMOTOR EXAM:  Ocular Alignment: normal  Ocular ROM: No Limitations  Spontaneous Nystagmus: absent  Gaze-Induced Nystagmus: absent  Smooth Pursuits: intact  Saccades:  ~2-3 saccadic beats to R with weird sensation, ~2 saccadic beats in vertical direction  Convergence/Divergence: 6 cm   VESTIBULAR - OCULAR REFLEX:   Slow VOR: Normal  VOR Cancellation: Normal  Head-Impulse Test: HIT Right: positive HIT Left: negative Pt reporting some dizziness going to the R side, pt initially closing her eyes when assessing to the L    POSITIONAL TESTING: Right Dix-Hallpike: upbeating, right nystagmus and pt immediately closing her eyes, so hard to tell duration, lasts  about 15-20 seconds  Performed with 2 pillows behind pt, pt taking incr time to get into long sitting position before assessing due to body habitus                                                                                                                              TREATMENT DATE: 04/14/24  Therapeutic Activity:   Pt reporting only one episode of dizziness when bending over since pt was last here that lasted a few seconds.   Performed 2 sets of 4 reps of bending over in standing to try to reproduce dizziness when bending over. Pt needing to hold onto countertop/chair due to knee pain. No dizziness was able to be reproduced. Discussed that dizziness episode might still have been a lingering motion sensitivity that pt experienced the other day. Discussed purchasing a reacher for home to help with bending, more so in regards to pt's knee pain. Pt will look into getting one.   Discussed that since pt no longer having dizziness, BPPV is  cleared, and that main limiting factor is pt's knee pain, will D/C from PT. Pt in agreement with plan and pleased with progress. Discussed to continue HEP and that if dizziness/spinning returns in the future, will need a new referral to return.   Discussed POC going forwards - will cancel next Monday's appt as pt no longer having dizziness and has been consistent with HEP, will keep next Wednesday's appt just in case dizziness returns and if it doesn't then can call and cancel, results of goals, discussed reoccurrence rates of BPPV and if it does return in the future, then will need a new referral to return    PATIENT EDUCATION: Education details: See above, D/C form PT  Person educated: Patient  Education method: Explanation, Demonstration Education comprehension: verbalized understanding, returned demonstration, and needs further education  HOME EXERCISE PROGRAM: Seated gaze stabilization for 30 seconds in horizontal and vertical directions   Standing head turns 5 reps and head nods 5 reps at RW for habituation   PHYSICAL THERAPY DISCHARGE SUMMARY  Visits from Start of Care: 6  Current functional level related to goals / functional outcomes: See LTGs/Clinical Assessment Statement    Remaining deficits: Impaired balance, gait abnormalities, decr activity tolerance (due to knee pain and not dizziness/vertigo)   Education / Equipment: HEP, BPPV education    Patient agrees to discharge. Patient goals were met. Patient is being discharged due to meeting the stated rehab goals.   GOALS: Goals reviewed with patient? Yes  SHORT TERM GOALS: ALL STGS = LTGS  LONG TERM GOALS: Target date: 04/21/2024  Pt will be independent with final HEP for vestibular deficits/dizziness in order to build upon functional gains made in therapy. Baseline: will provide at future session if needed  Goal status: MET  2.  Further vestibular testing to be assessed with goal written after resolution of  BPPV. Baseline: N/A Goal status: N/A  3.  Pt will demo negative positional testing in order to demo decr dizziness in daily life.  Baseline: (+) R posterior canalithiasis   Pt negative for BPPV  Goal status: MET  4.  Pt will decr RPQ-3 to 4 or less and RPQ-13 to 30 or less in order to demo improved symptoms.  Baseline: RPQ-3 = 8, RPQ-13 = 43  RPQ-3 = 1, RPQ-13 = 10 on 5/2 Goal status: MET    ASSESSMENT:  CLINICAL IMPRESSION: Pt arrives to therapy reporting only one brief dizziness episode since she was last here. Pt went to bend over to pick up something from the floor and felt dizzy for a couple seconds after coming back up. Pt reporting more lightheadedness. Unable to reproduce it today with bending over. Pt's main limiting factor to mobility is her knee pain - will be getting injections soon. Due to progress and meeting all LTGs, pt in agreement to D/C from PT due to a significant improvement in vertigo/dizziness.    OBJECTIVE IMPAIRMENTS: Abnormal gait, decreased activity tolerance, decreased balance, decreased mobility, difficulty walking, decreased ROM, dizziness, postural dysfunction, and obesity.   ACTIVITY LIMITATIONS: carrying, lifting, bending, standing, stairs, transfers, bed mobility, locomotion level, and caring for others  PARTICIPATION LIMITATIONS: laundry, driving, shopping, community activity, occupation, and yard work  PERSONAL FACTORS: Age, Behavior pattern, Past/current experiences, Time since onset of injury/illness/exacerbation, and 3+ comorbidities: Diabetes, anemia, HTN, bilateral knee pain, obesity   are also affecting patient's functional outcome.   REHAB POTENTIAL: Good  CLINICAL DECISION MAKING: Evolving/moderate complexity  EVALUATION COMPLEXITY: Moderate   PLAN:  PT FREQUENCY: 1-2x/week  PT DURATION: 4 weeks  PLANNED INTERVENTIONS: 97164- PT Re-evaluation, 97110-Therapeutic exercises, 97530- Therapeutic activity, [id number]- Neuromuscular  re-education, 97535- Self Care, 96295- Manual therapy, [phone]- Gait training, [phone]- Canalith repositioning, Patient/Family education, Balance training, Vestibular training, and DME instructions  PLAN FOR NEXT SESSION: Cesar Collins, PT, DPT 04/14/2024, 4:03 PM

## 2024-04-14 NOTE — Telephone Encounter (Signed)
 Let pt know that I have written letter releasing her to return to work 04/16/2024.  Can pick up letter.

## 2024-04-15 NOTE — Telephone Encounter (Signed)
 I have updated her return to work letter to reflect return to work on 04/19/2024.  Letter ready for pickup.

## 2024-04-19 ENCOUNTER — Other Ambulatory Visit: Payer: Self-pay

## 2024-04-26 ENCOUNTER — Ambulatory Visit (INDEPENDENT_AMBULATORY_CARE_PROVIDER_SITE_OTHER): Admitting: Orthopedic Surgery

## 2024-04-26 DIAGNOSIS — M170 Bilateral primary osteoarthritis of knee: Secondary | ICD-10-CM

## 2024-04-27 ENCOUNTER — Encounter: Payer: Self-pay | Admitting: Internal Medicine

## 2024-04-27 ENCOUNTER — Encounter: Payer: Self-pay | Admitting: Orthopedic Surgery

## 2024-04-27 DIAGNOSIS — M170 Bilateral primary osteoarthritis of knee: Secondary | ICD-10-CM | POA: Diagnosis not present

## 2024-04-27 MED ORDER — TRIAMCINOLONE ACETONIDE 40 MG/ML IJ SUSP
40.0000 mg | INTRAMUSCULAR | Status: AC | PRN
  Administered 2024-04-26: 40 mg via INTRA_ARTICULAR

## 2024-04-27 MED ORDER — LIDOCAINE HCL 1 % IJ SOLN
5.0000 mL | INTRAMUSCULAR | Status: AC | PRN
  Administered 2024-04-26: 5 mL

## 2024-04-27 MED ORDER — BUPIVACAINE HCL 0.25 % IJ SOLN
4.0000 mL | INTRAMUSCULAR | Status: AC | PRN
  Administered 2024-04-26: 4 mL via INTRA_ARTICULAR

## 2024-04-27 NOTE — Progress Notes (Addendum)
 Office Visit Note   Patient: Linda Cooper           Date of Birth: [birth year]           MRN: [id number] Visit Date: 04/26/2024 Requested by: Lawrance Presume, MD [address] [place],  [place] [address] PCP: Lawrance Presume, MD  Subjective: Chief Complaint  Patient presents with   Right Knee - Pain   Left Knee - Pain    HPI: Linda Cooper is a 63 y.o. female who presents to the office reporting bilateral knee pain.  Patient had bilateral cortisone injection 06/30/2023.  That actually helped but not quite long enough.  She does take tramadol  for symptoms.  Left knee worse than right knee.  She is well-controlled diabetic.  Patient does do physical type work..                ROS: All systems reviewed are negative as they relate to the chief complaint within the history of present illness.  Patient denies fevers or chills.  Assessment & Plan: Visit Diagnoses:  1. Primary osteoarthritis of both knees     Plan: Impression is bilateral knee arthritis.  Repeat cortisone injection performed today.  She may need knee replacement at sometime in the future.  BMI likely too high at this time.  Will see how she does with these injections and follow-up as needed.  Continue with nonload bearing quad strengthening exercises.  Follow-Up Instructions: No follow-ups on file.   Orders:  No orders of the defined types were placed in this encounter.  No orders of the defined types were placed in this encounter.     Procedures: Large Joint Inj: bilateral knee on 04/26/2024 11:43 AM Indications: diagnostic evaluation, joint swelling and pain Details: 18 G 1.5 in needle, superolateral approach  Arthrogram: No  Medications (Right): 5 mL lidocaine  1 %; 4 mL bupivacaine  0.25 %; 40 mg triamcinolone  acetonide 40 MG/ML Medications (Left): 5 mL lidocaine  1 %; 4 mL bupivacaine  0.25 %; 40 mg triamcinolone  acetonide 40 MG/ML Outcome: tolerated well, no immediate  complications Procedure, treatment alternatives, risks and benefits explained, specific risks discussed. Consent was given by the patient. Immediately prior to procedure a time out was called to verify the correct patient, procedure, equipment, support staff and site/side marked as required. Patient was prepped and draped in the usual sterile fashion.       Clinical Data: No additional findings.  Objective: Vital Signs: There were no vitals taken for this visit.  Physical Exam:  Constitutional: Patient appears well-developed HEENT:  Head: Normocephalic Eyes:EOM are normal Neck: Normal range of motion Cardiovascular: Normal rate Pulmonary/chest: Effort normal Neurologic: Patient is alert Skin: Skin is warm Psychiatric: Patient has normal mood and affect  Ortho Exam: Ortho exam demonstrates no effusion in either knee.  About a 5 degree flexion contracture in both knees.  Global tenderness on both the medial lateral joint lines bilaterally.  Both feet are perfused and sensate.  No groin pain with internal/external rotation of the leg.  Flexion past 90 with functional extensor mechanism  Specialty Comments:  No specialty comments available.  Imaging: No results found.   PMFS History: Patient Active Problem List   Diagnosis Date Noted   Hypertension associated with diabetes (HCC) 08/13/2023   Type 2 diabetes mellitus with morbid obesity (HCC) 08/13/2023   Iron deficiency anemia 08/13/2023   Urge incontinence 08/31/2021   Pain, dental 08/16/2021   Trigger middle finger of left  hand 11/08/2019   Carpal tunnel syndrome of left wrist 07/09/2019   Morbid obesity with body mass index (BMI) of 50.0 to 59.9 in adult (HCC) 10/16/2018   Morbid obesity (HCC) 11/11/2014   PCOS (polycystic ovarian syndrome) 11/10/2014   Degenerative arthritis of knee, bilateral 11/10/2014   Past Medical History:  Diagnosis Date   'Light-for-dates' infant with signs of fetal malnutrition    Anemia     Arthritis    Asthma    Diabetes mellitus without complication (HCC) Dx 2007   GERD (gastroesophageal reflux disease)    Hypertension Dx 1990   Knee pain, bilateral    Obesity    Rheumatic fever childhood    has intermittent murmur and hand arthritis     Family History  Problem Relation Age of Onset   Hypertension Mother    Diabetes Mother    Stroke Mother    Heart disease Mother    Obesity Mother    Hypertension Father    Diabetes Father    Stroke Father    Obesity Father    Heart disease Father    Hyperlipidemia Father    Heart disease Sister    Diabetes Brother    Hypertension Brother    Gout Brother    Breast cancer Cousin    Cancer Neg Hx     Past Surgical History:  Procedure Laterality Date   ABDOMINAL HYSTERECTOMY  2006    HERNIA REPAIR  2006    Umbilical    Social History   Occupational History   Occupation: Technical brewer    Comment: [employer], in home care,   Tobacco Use   Smoking status: Never   Smokeless tobacco: Never  Vaping Use   Vaping status: Never Used  Substance and Sexual Activity   Alcohol use: No   Drug use: No   Sexual activity: Not Currently

## 2024-04-28 ENCOUNTER — Encounter: Attending: Physical Medicine & Rehabilitation | Admitting: Physical Medicine & Rehabilitation

## 2024-04-28 ENCOUNTER — Encounter: Payer: Self-pay | Admitting: Physical Medicine & Rehabilitation

## 2024-04-28 VITALS — BP 154/90 | HR 80 | Ht 62.0 in | Wt 278.2 lb

## 2024-04-28 DIAGNOSIS — H8113 Benign paroxysmal vertigo, bilateral: Secondary | ICD-10-CM | POA: Diagnosis present

## 2024-04-28 DIAGNOSIS — H8110 Benign paroxysmal vertigo, unspecified ear: Secondary | ICD-10-CM | POA: Insufficient documentation

## 2024-04-28 DIAGNOSIS — F0781 Postconcussional syndrome: Secondary | ICD-10-CM | POA: Diagnosis not present

## 2024-04-28 NOTE — Patient Instructions (Addendum)
 ALWAYS FEEL FREE TO CALL OUR OFFICE WITH ANY PROBLEMS OR QUESTIONS [phone])  **PLEASE NOTE** ALL MEDICATION REFILL REQUESTS (INCLUDING CONTROLLED SUBSTANCES) NEED TO BE MADE AT LEAST 7 DAYS PRIOR TO REFILL BEING DUE. ANY REFILL REQUESTS INSIDE THAT TIME FRAME MAY RESULT IN DELAYS IN RECEIVING YOUR PRESCRIPTION.     INSTRUCTIONS:   1. Continue with gaze exercises for vertigo daily for 2 months then perhaps weekly for 2 months then PRN   2. Adequate sleep at least 7 hours per night  3. Avoid excessive heat--if you overheat, find a cool place as soon as you can  4. Exercise to tolerance  5. Balanced diet and plenty of fluids.

## 2024-04-28 NOTE — Progress Notes (Signed)
 Subjective:    Patient ID: Linda Cooper, female    DOB: 1961-05-27, 63 y.o.   MRN: [id number]  HPI  This is an initial visit for Linda Cooper who is a 63 year old female who apparently fell at work on 03/03/2024 when her chair moved and she fell backwards and hit the back of her head on the front of the chair.  She was startled at the time but no frank dizziness initially.  She did report some early aching and pain from the fall.  She presented 2 days later to the emergency room with aching all over and onset of dizziness.  Per ER notes pain was from the top of the neck and lower skull down into the back.  She statds that she has had vertigo in the past.  CT of the neck was performed which demonstrated multilevel cervical spondylosis worst at C5-C6 but no signs of fracture or spondylolisthesis.  CTA of the head and neck revealed no major stenoses or abnormalities.  Of note was a multinodular thyroid .  Given her severe dizziness and vertigo an MRI of the brain was performed and demonstrated no acute abnormality.  Patient was ultimately discharged to home with a diagnosis of a concussion and given a prescription for meclizine .  She was sent to [facility] for vestibular treatment which began at the end of April.  She appears to have responded to treatments with improved dizziness by report.  Last session of physical therapy appears to have been on 04/14/2024.She is dong her gaze exercises at home.   Vision is near baseline although she needs to see the optometrist. she wears glasses.   Her cognition is back to baseline with normal memory and concentration.  From a sleep standpoint she's at baseline. she has to wake up at night to go to the bathroom. She sleeps about 7 hours per night. Her mood has been positive since she's startedtto feeling better.   She currently has not been having any headaches other than one after he recent knee injections. It sometimes is there in the morning.    Patient also saw Dr. Rozelle Corning for arthritis of both knees and bilateral knee injections on 04/26/2024. She needs to lose weight before she has tka's   She has been working as an Primary school teacher. She recently was retired after being off the job for the fall. .   Pain Inventory Average Pain 10 Pain Right Now 6 My pain is constant, sharp, and dull  LOCATION OF PAIN  Knee  BOWEL Number of stools per week: 2 Oral laxative use No  Type of laxative n/a Enema or suppository use No  History of colostomy No  Incontinent No   BLADDER Pads  Frequent urination Yes   Mobility walk with assistance use a cane ability to climb steps?  yes do you drive?  yes  Function not employed: date last employed n/a  Neuro/Psych numbness trouble walking loss of taste or smell  Prior Studies x-rays   Family History  Problem Relation Age of Onset   Hypertension Mother    Diabetes Mother    Stroke Mother    Heart disease Mother    Obesity Mother    Hypertension Father    Diabetes Father    Stroke Father    Obesity Father    Heart disease Father    Hyperlipidemia Father    Heart disease Sister    Diabetes Brother    Hypertension Brother  Gout Brother    Breast cancer Cousin    Cancer Neg Hx    Social History   Socioeconomic History   Marital status: Single    Spouse name: Not on file   Number of children: 1    Years of education: college    Highest education level: Not on file  Occupational History   Occupation: Technical brewer    Comment: [employer], in home care,   Tobacco Use   Smoking status: Never   Smokeless tobacco: Never  Vaping Use   Vaping status: Never Used  Substance and Sexual Activity   Alcohol use: No   Drug use: No   Sexual activity: Not Currently  Other Topics Concern   Not on file  Social History Narrative   Lives at home with son.    Son is 76.       She is motivated to lose weight and improve health by the expected death of her  father  01/04/2017) and the sudden deaths of her cousin (found dead in car one month after death of his wife March 04, 2017)  and his wife (died after childbirth 04-Feb-2017)    Exercise: minimal    Social Drivers of Health   Financial Resource Strain: Medium Risk (02/10/2024)   Overall Financial Resource Strain (CARDIA)    Difficulty of Paying Living Expenses: Somewhat hard  Food Insecurity: No Food Insecurity (02/10/2024)   Hunger Vital Sign    Worried About Running Out of Food in the Last Year: Never true    Ran Out of Food in the Last Year: Never true  Transportation Needs: No Transportation Needs (02/10/2024)   PRAPARE - Administrator, Civil Service (Medical): No    Lack of Transportation (Non-Medical): No  Physical Activity: Inactive (02/10/2024)   Exercise Vital Sign    Days of Exercise per Week: 0 days    Minutes of Exercise per Session: 0 min  Stress: No Stress Concern Present (02/10/2024)   [employer] of Occupational Health - Occupational Stress Questionnaire    Feeling of Stress : Only a little  Social Connections: Moderately Integrated (02/10/2024)   Social Connection and Isolation Panel [NHANES]    Frequency of Communication with Friends and Family: Three times a week    Frequency of Social Gatherings with Friends and Family: Three times a week    Attends Religious Services: More than 4 times per year    Active Member of Clubs or Organizations: Yes    Attends Banker Meetings: More than 4 times per year    Marital Status: Never married   Past Surgical History:  Procedure Laterality Date   ABDOMINAL HYSTERECTOMY  2006    HERNIA REPAIR  2006    Umbilical    Past Medical History:  Diagnosis Date   'Light-for-dates' infant with signs of fetal malnutrition    Anemia    Arthritis    Asthma    Diabetes mellitus without complication (HCC) Dx 2007   GERD (gastroesophageal reflux disease)    Hypertension Dx 1990   Knee pain, bilateral    Obesity    Rheumatic  fever childhood    has intermittent murmur and hand arthritis    BP (!) 154/90 (BP Location: Right Arm, Patient Position: Sitting)   Pulse 80   Ht 5\' 2"  (1.575 m)   Wt 278 lb 3.2 oz (126.2 kg)   SpO2 97%   BMI 50.88 kg/m   Opioid Risk Score:   Fall  Risk Score:  `1  Depression screen PHQ 2/9     04/28/2024    1:32 PM 04/02/2024   11:33 AM 02/10/2024    3:33 PM 08/12/2023    3:42 PM 01/10/2023    2:23 PM 01/10/2023    2:22 PM 09/06/2022    2:32 PM  Depression screen PHQ 2/9  Decreased Interest 0 0 0 0 0 0 0  Down, Depressed, Hopeless 0 0 0 0 0 0 0  PHQ - 2 Score 0 0 0 0 0 0 0  Altered sleeping  0 0 0 0  0  Tired, decreased energy  1 1 0 2  0  Change in appetite  1 1 2 1   0  Feeling bad or failure about yourself   0 0 0 0  0  Trouble concentrating  2 0 0 0  0  Moving slowly or fidgety/restless  1 0 0 0  0  Suicidal thoughts  0 0 0 0  0  PHQ-9 Score  5 2 2 3   0  Difficult doing work/chores  Somewhat difficult Somewhat difficult         Review of Systems  Genitourinary:  Positive for frequency.  Musculoskeletal:  Positive for gait problem.  Neurological:  Positive for numbness.  All other systems reviewed and are negative.      Objective:   Physical Exam Gen: no distress, obese HEENT: oral mucosa pink and moist, NCAT Cardio: Reg rate Chest: normal effort, normal rate of breathing Abd: soft, non-distended Ext: no edema Psych: pleasant, normal affect Skin: intact Neuro: Alert and oriented x 3. Normal insight and awareness. Intact Memory. Normal concentration. Normal language and speech. Cranial nerve exam unremarkable. MMT: 5/5 UE porx to distal. BLE 3+ to 4/5 HF, KE and 5/5. Pain inhibition at knees. Sensory exam normal for light touch and pain in all 4 limbs. No limb ataxia or cerebellar signs. No abnormal tone appreciated.  Felt a little off from a equilibrium standpoint with gaze to right.  Felt uneasy with visual tracking. No nystagmus.  Musculoskeletal: varus deformity  in both knees left > right. Antalgic bilaterally L>R.         Assessment & Plan:  Postconcussion syndrome: she has largely improved! BPPV--still occasional mild sx Mild post-traumatic headaches Transient cognitive deficits Reactive depression 2. Morbid obesity 3. OA bilaterals knees   Plan:  1. Continue with gaze HEP for BPPV--daily for 2 months then weekly for 2 mos after  2. Adequate sleep at least 7 hours per night  3. Avoid excessive heat  4. Exercise to tolerance  5. Balanced diet and plenty of fluids.   Forty-five minutes of face to face patient care time were spent during this visit. All questions were encouraged and answered. Follow up with me on an as needed basis. Aaron Aas

## 2024-05-04 ENCOUNTER — Other Ambulatory Visit: Payer: Self-pay

## 2024-05-06 ENCOUNTER — Telehealth: Admitting: Internal Medicine

## 2024-05-07 ENCOUNTER — Other Ambulatory Visit: Payer: Self-pay

## 2024-05-17 ENCOUNTER — Telehealth (HOSPITAL_BASED_OUTPATIENT_CLINIC_OR_DEPARTMENT_OTHER): Admitting: Internal Medicine

## 2024-05-17 ENCOUNTER — Other Ambulatory Visit (HOSPITAL_COMMUNITY): Payer: Self-pay

## 2024-05-17 ENCOUNTER — Other Ambulatory Visit: Payer: Self-pay

## 2024-05-17 ENCOUNTER — Telehealth: Admitting: Internal Medicine

## 2024-05-17 ENCOUNTER — Encounter: Payer: Self-pay | Admitting: Internal Medicine

## 2024-05-17 DIAGNOSIS — Z7985 Long-term (current) use of injectable non-insulin antidiabetic drugs: Secondary | ICD-10-CM

## 2024-05-17 DIAGNOSIS — E1169 Type 2 diabetes mellitus with other specified complication: Secondary | ICD-10-CM

## 2024-05-17 MED ORDER — TIRZEPATIDE 2.5 MG/0.5ML ~~LOC~~ SOAJ
2.5000 mg | SUBCUTANEOUS | 0 refills | Status: DC
  Filled 2024-05-17: qty 2, 28d supply, fill #0

## 2024-05-17 NOTE — Progress Notes (Signed)
 Virtual Visit via Video Note  I connected with Linda Cooper on 05/17/2024 at 10:14 AM by a video enabled telemedicine application and verified that I am speaking with the correct person using two identifiers.  Location: Patient: home Provider: Office   I discussed the limitations of evaluation and management by telemedicine and the availability of in person appointments. The patient expressed understanding and agreed to proceed.  History of Present Illness: Hx of HTN, DM, morbid obesity and OA knees,   Discussed the use of AI scribe software for clinical note transcription with the patient, who gave verbal consent to proceed.  Discussed the use of AI scribe software for clinical note transcription with the patient, who gave verbal consent to proceed.  History of Present Illness Linda Cooper is a 63 year old female with obesity and type 2 diabetes who presents for evaluation of weight management options.  She is interested in starting Mounjaro or Ozempic  for weight loss. Her weight on last visit in 03/2024 was 280 pounds with a BMI of 51, which affects her eligibility for knee replacement surgery, requiring a BMI of 40 or under. She manages type 2 diabetes with metformin  500 mg, and her last A1c in March was 6.2. She has not been checking her blood sugars recently and needs a new monitor. She experienced significant constipation when she was on Trulicity  in the past, raising concerns about gastrointestinal side effects with similar medications.   Outpatient Encounter Medications as of 05/17/2024  Medication Sig   albuterol  (VENTOLIN  HFA) 108 (90 Base) MCG/ACT inhaler Inhale 2 puffs into the lungs every 4 (four) hours as needed for wheezing.   amLODipine  (NORVASC ) 5 MG tablet Take 1 tablet (5 mg total) by mouth daily.   Blood Glucose Monitoring Suppl (BLOOD GLUCOSE MONITOR SYSTEM) w/Device KIT use daily as directed.   diazepam  (VALIUM ) 5 MG tablet Take 1 tablet (5 mg total) by mouth 2  (two) times daily.   glucose blood (TRUE METRIX BLOOD GLUCOSE TEST) test strip Use to check blood sugar once daily.   hydrALAZINE  (APRESOLINE ) 100 MG tablet Take 1 tablet (100 mg total) by mouth 2 (two) times daily.   losartan -hydrochlorothiazide  (HYZAAR ) 100-25 MG tablet TAKE 1 TABLET BY MOUTH DAILY.   meclizine  (ANTIVERT ) 25 MG tablet Take 1 tablet (25 mg total) by mouth daily as needed for dizziness.   metFORMIN  (GLUCOPHAGE -XR) 500 MG 24 hr tablet Take 1 tablet (500 mg total) by mouth daily with breakfast.   Multiple Vitamins-Minerals (MULTIVITAMIN WITH MINERALS) tablet Take 1 tablet by mouth daily. (Patient not taking: Reported on 03/12/2024)   NON FORMULARY SEGA PRO BLADDER HEALTH 100 MG DAILY   solifenacin  (VESICARE ) 10 MG tablet Take 1 tablet (10 mg total) by mouth daily.   traMADol  (ULTRAM ) 50 MG tablet Take 1 tablet (50 mg total) by mouth every 12 (twelve) hours as needed.   TRUEplus Lancets 28G MISC Use to check blood sugar once daily.   No facility-administered encounter medications on file as of 05/17/2024.      Observations/Objective: Older AAF sitting in chair in NAD  Assessment and Plan: 1. Type 2 diabetes mellitus with morbid obesity (HCC) (Primary) - I think she is an excellent candidate for Mounjaro not just for diabetes but more so to assist with weight loss.  I went over with pt how the medication works and potential side effects including nausea, vomiting, diarrhea/constipation, bowel blockage, palpitations and pancreatitis.  Advised to stop the medicine and be seen if pt  develops any abdominal pain, vomiting, severe diarrhea/constipation or palpitations. -Advised to include a protein with each meal to help prevent loss of muscle mass. - Advised that it is a once weekly injection and we will start with the lowest dose of the 2.5 mg once a week.  Patient to have clinical pharmacist show her how to administer the medicine once it is dispensed.   - tirzepatide (MOUNJARO) 2.5  MG/0.5ML Pen; Inject 2.5 mg into the skin once a week.  Dispense: 2 mL; Refill: 0   Follow Up Instructions: In 1 mth   I discussed the assessment and treatment plan with the patient. The patient was provided an opportunity to ask questions and all were answered. The patient agreed with the plan and demonstrated an understanding of the instructions.   The patient was advised to call back or seek an in-person evaluation if the symptoms worsen or if the condition fails to improve as anticipated.  I spent 15 minutes dedicated to the care of this patient on the date of this encounter to include previsit review of of chart, face-to-face time with patient discussing diagnosis and management and post visit entering of orders.  This note has been created with Education officer, environmental. Any transcriptional errors are unintentional.  Concetta Dee, MD

## 2024-05-18 ENCOUNTER — Other Ambulatory Visit: Payer: Self-pay

## 2024-05-20 ENCOUNTER — Other Ambulatory Visit: Payer: Self-pay

## 2024-05-21 ENCOUNTER — Other Ambulatory Visit: Payer: Self-pay

## 2024-05-21 ENCOUNTER — Other Ambulatory Visit: Payer: Self-pay | Admitting: Internal Medicine

## 2024-05-21 DIAGNOSIS — M170 Bilateral primary osteoarthritis of knee: Secondary | ICD-10-CM

## 2024-05-21 DIAGNOSIS — Z79891 Long term (current) use of opiate analgesic: Secondary | ICD-10-CM

## 2024-05-22 ENCOUNTER — Other Ambulatory Visit: Payer: Self-pay

## 2024-05-22 MED ORDER — TRAMADOL HCL 50 MG PO TABS
50.0000 mg | ORAL_TABLET | Freq: Two times a day (BID) | ORAL | 1 refills | Status: DC | PRN
  Filled 2024-05-22: qty 60, 30d supply, fill #0
  Filled 2024-07-05: qty 60, 30d supply, fill #1

## 2024-05-24 ENCOUNTER — Other Ambulatory Visit: Payer: Self-pay

## 2024-05-25 ENCOUNTER — Other Ambulatory Visit: Payer: Self-pay

## 2024-05-27 ENCOUNTER — Other Ambulatory Visit: Payer: Self-pay

## 2024-05-28 ENCOUNTER — Other Ambulatory Visit: Payer: Self-pay

## 2024-05-31 ENCOUNTER — Other Ambulatory Visit: Payer: Self-pay

## 2024-06-01 ENCOUNTER — Other Ambulatory Visit: Payer: Self-pay

## 2024-06-09 ENCOUNTER — Encounter: Payer: Self-pay | Admitting: Internal Medicine

## 2024-06-09 ENCOUNTER — Other Ambulatory Visit: Payer: Self-pay | Admitting: Internal Medicine

## 2024-06-09 ENCOUNTER — Other Ambulatory Visit: Payer: Self-pay

## 2024-06-09 ENCOUNTER — Other Ambulatory Visit: Payer: Self-pay | Admitting: Pharmacist

## 2024-06-09 MED ORDER — ONETOUCH VERIO VI STRP
ORAL_STRIP | 6 refills | Status: DC
  Filled 2024-06-09: qty 100, 33d supply, fill #0
  Filled 2024-09-07: qty 100, 33d supply, fill #1
  Filled 2024-10-18: qty 100, 33d supply, fill #2

## 2024-06-09 MED ORDER — ONETOUCH VERIO FLEX SYSTEM W/DEVICE KIT
PACK | 0 refills | Status: DC
  Filled 2024-06-09: qty 1, 30d supply, fill #0

## 2024-06-09 MED ORDER — ONETOUCH DELICA PLUS LANCET33G MISC
6 refills | Status: DC
  Filled 2024-06-09: qty 100, 33d supply, fill #0
  Filled 2024-10-18: qty 100, 33d supply, fill #1

## 2024-06-14 ENCOUNTER — Other Ambulatory Visit: Payer: Self-pay

## 2024-06-14 ENCOUNTER — Encounter: Payer: Self-pay | Admitting: Internal Medicine

## 2024-06-14 ENCOUNTER — Ambulatory Visit: Attending: Internal Medicine | Admitting: Internal Medicine

## 2024-06-14 DIAGNOSIS — Z79899 Other long term (current) drug therapy: Secondary | ICD-10-CM | POA: Diagnosis not present

## 2024-06-14 DIAGNOSIS — M170 Bilateral primary osteoarthritis of knee: Secondary | ICD-10-CM

## 2024-06-14 DIAGNOSIS — I152 Hypertension secondary to endocrine disorders: Secondary | ICD-10-CM

## 2024-06-14 DIAGNOSIS — E119 Type 2 diabetes mellitus without complications: Secondary | ICD-10-CM | POA: Diagnosis not present

## 2024-06-14 DIAGNOSIS — Z6841 Body Mass Index (BMI) 40.0 and over, adult: Secondary | ICD-10-CM | POA: Diagnosis not present

## 2024-06-14 DIAGNOSIS — R6889 Other general symptoms and signs: Secondary | ICD-10-CM

## 2024-06-14 DIAGNOSIS — I10 Essential (primary) hypertension: Secondary | ICD-10-CM

## 2024-06-14 DIAGNOSIS — Z789 Other specified health status: Secondary | ICD-10-CM

## 2024-06-14 DIAGNOSIS — E042 Nontoxic multinodular goiter: Secondary | ICD-10-CM | POA: Diagnosis not present

## 2024-06-14 DIAGNOSIS — N3941 Urge incontinence: Secondary | ICD-10-CM | POA: Diagnosis not present

## 2024-06-14 DIAGNOSIS — Z7985 Long-term (current) use of injectable non-insulin antidiabetic drugs: Secondary | ICD-10-CM | POA: Diagnosis not present

## 2024-06-14 DIAGNOSIS — E1169 Type 2 diabetes mellitus with other specified complication: Secondary | ICD-10-CM

## 2024-06-14 LAB — POCT GLYCOSYLATED HEMOGLOBIN (HGB A1C): HbA1c, POC (prediabetic range): 5.9 % (ref 5.7–6.4)

## 2024-06-14 LAB — GLUCOSE, POCT (MANUAL RESULT ENTRY): POC Glucose: 91 mg/dL (ref 70–99)

## 2024-06-14 MED ORDER — LOSARTAN POTASSIUM-HCTZ 100-25 MG PO TABS
1.0000 | ORAL_TABLET | Freq: Every day | ORAL | 1 refills | Status: DC
  Filled 2024-06-14 – 2024-07-05 (×2): qty 90, 90d supply, fill #0
  Filled 2024-09-29: qty 90, 90d supply, fill #1

## 2024-06-14 MED ORDER — SOLIFENACIN SUCCINATE 10 MG PO TABS
10.0000 mg | ORAL_TABLET | Freq: Every day | ORAL | 1 refills | Status: AC
  Filled 2024-06-14 – 2024-09-07 (×2): qty 90, 90d supply, fill #0
  Filled 2024-12-07: qty 90, 90d supply, fill #1

## 2024-06-14 NOTE — Progress Notes (Signed)
 Patient ID: Linda Cooper, female    DOB: Aug 19, 1961  MRN: [id number]  CC: Diabetes (Dm f/u. Med refill. [facility] Mounjaro  - insurance will temporarily cover mounjaro /Discuss imaging results from hospital )   Subjective: Linda Cooper is a 63 y.o. female who presents for chronic ds management. Her concerns today include:  Hx of HTN, DM, morbid obesity and OA knees,   Discussed the use of AI scribe software for clinical note transcription with the patient, who gave verbal consent to proceed.  Discussed the use of AI scribe software for clinical note transcription with the patient, who gave verbal consent to proceed.  History of Present Illness Linda Cooper is a 63 year old female with diabetes and hypertension who presents for follow-up on diabetes management and weight loss.  DM Results for orders placed or performed in visit on 06/14/24  POCT glucose (manual entry)   Collection Time: 06/14/24  4:12 PM  Result Value Ref Range   POC Glucose 91 70 - 99 mg/dl  POCT glycosylated hemoglobin (Hb A1C)   Collection Time: 06/14/24  4:30 PM  Result Value Ref Range   Hemoglobin A1C     HbA1c POC (<> result, manual entry)     HbA1c, POC (prediabetic range) 5.9 5.7 - 6.4 %   HbA1c, POC (controlled diabetic range)    She is on Mounjaro  for diabetes and weight management, currently at a dose of 2.5 mg, with her second dose taken today. Over two weeks, she experiences decreased appetite, early satiety, and reduced morning hunger. Since April 02, 2024, she has lost 9 pounds, with her weight decreasing from 280 to 271 pounds. Blood sugar levels range from 96 to 127 mg/dL before breakfast, and her recent A1c is 5.9%. She discontinued metformin  upon starting Mounjaro . Not on statin therapy.  Statin because muscle aches and weakness in the legs when tried in the past.  HTN: Blood pressure readings are generally stable, with recent measurements of 128/73 mmHg and 107/68 mmHg. She takes  losartan -hydrochlorothiazide  100/25 mg daily and hydralazine  100 mg twice daily.  Should also be taking amlodipine  5 mg daily but states that she sometimes skips a day of taking it if she feels a little lightheaded.  OA of the knees: Recently saw Dr. Addie and had injections to both knees which was helpful.  She also uses tramadol , generic Voltaren gel, and Biofreeze for pain management. Weight loss is a goal to facilitate potential knee surgery.   A multinodular thyroid  was identified on CTA head/neck done through ER in March.  She wonders if this needs to be followed up on. She frequently feels cold, which is unusual for her.  Urge incontinence: Requests refill on Vesicare .    Patient Active Problem List   Diagnosis Date Noted   Postconcussion syndrome 04/28/2024   BPPV (benign paroxysmal positional vertigo) 04/28/2024   Hypertension associated with diabetes (HCC) 08/13/2023   Type 2 diabetes mellitus with morbid obesity (HCC) 08/13/2023   Iron deficiency anemia 08/13/2023   Urge incontinence 08/31/2021   Pain, dental 08/16/2021   Trigger middle finger of left hand 11/08/2019   Carpal tunnel syndrome of left wrist 07/09/2019   Morbid obesity with body mass index (BMI) of 50.0 to 59.9 in adult (HCC) 10/16/2018   Morbid obesity (HCC) 11/11/2014   PCOS (polycystic ovarian syndrome) 11/10/2014   Degenerative arthritis of knee, bilateral 11/10/2014     Current Outpatient Medications on File Prior to Visit  Medication Sig Dispense Refill  albuterol  (VENTOLIN  HFA) 108 (90 Base) MCG/ACT inhaler Inhale 2 puffs into the lungs every 4 (four) hours as needed for wheezing. 6.7 g 1   amLODipine  (NORVASC ) 5 MG tablet Take 1 tablet (5 mg total) by mouth daily. 90 tablet 1   Blood Glucose Monitoring Suppl (ONETOUCH VERIO FLEX SYSTEM) w/Device KIT Use to check blood sugar three times daily. 1 kit 0   diazepam  (VALIUM ) 5 MG tablet Take 1 tablet (5 mg total) by mouth 2 (two) times daily. 10 tablet 0    glucose blood (ONETOUCH VERIO) test strip Use to check blood sugar three times daily. 100 each 6   hydrALAZINE  (APRESOLINE ) 100 MG tablet Take 1 tablet (100 mg total) by mouth 2 (two) times daily. 180 tablet 3   Lancets (ONETOUCH DELICA PLUS LANCET33G) MISC Use to check blood sugar three times daily. 100 each 6   meclizine  (ANTIVERT ) 25 MG tablet Take 1 tablet (25 mg total) by mouth daily as needed for dizziness. 20 tablet 0   metFORMIN  (GLUCOPHAGE -XR) 500 MG 24 hr tablet Take 1 tablet (500 mg total) by mouth daily with breakfast. 90 tablet 1   Multiple Vitamins-Minerals (MULTIVITAMIN WITH MINERALS) tablet Take 1 tablet by mouth daily.     NON FORMULARY SEGA PRO BLADDER HEALTH 100 MG DAILY     tirzepatide  (MOUNJARO ) 2.5 MG/0.5ML Pen Inject 2.5 mg into the skin once a week. 2 mL 0   traMADol  (ULTRAM ) 50 MG tablet Take 1 tablet (50 mg total) by mouth every 12 (twelve) hours as needed. 60 tablet 1   No current facility-administered medications on file prior to visit.    Allergies  Allergen Reactions   Ace Inhibitors Cough   Azithromycin  Nausea And Vomiting   Erythromycin Base     Cramping in abdomen is reaction/no rash   Lisinopril Cough    Social History   Socioeconomic History   Marital status: Single    Spouse name: Not on file   Number of children: 1    Years of education: college    Highest education level: Not on file  Occupational History   Occupation: Technical brewer    Comment: [employer], in home care,   Tobacco Use   Smoking status: Never   Smokeless tobacco: Never  Vaping Use   Vaping status: Never Used  Substance and Sexual Activity   Alcohol use: No   Drug use: No   Sexual activity: Not Currently  Other Topics Concern   Not on file  Social History Narrative   Lives at home with son.    Son is 17.       She is motivated to lose weight and improve health by the expected death of her father  01-03-2017) and the sudden deaths of her cousin (found dead in car one  month after death of his wife Mar 03, 2017)  and his wife (died after childbirth 2017-02-03)    Exercise: minimal    Social Drivers of Health   Financial Resource Strain: Medium Risk (02/10/2024)   Overall Financial Resource Strain (CARDIA)    Difficulty of Paying Living Expenses: Somewhat hard  Food Insecurity: No Food Insecurity (02/10/2024)   Hunger Vital Sign    Worried About Running Out of Food in the Last Year: Never true    Ran Out of Food in the Last Year: Never true  Transportation Needs: No Transportation Needs (02/10/2024)   PRAPARE - Transportation    Lack of Transportation (Medical): No    Lack  of Transportation (Non-Medical): No  Physical Activity: Inactive (02/10/2024)   Exercise Vital Sign    Days of Exercise per Week: 0 days    Minutes of Exercise per Session: 0 min  Stress: No Stress Concern Present (02/10/2024)   [employer] of Occupational Health - Occupational Stress Questionnaire    Feeling of Stress : Only a little  Social Connections: Moderately Integrated (02/10/2024)   Social Connection and Isolation Panel    Frequency of Communication with Friends and Family: Three times a week    Frequency of Social Gatherings with Friends and Family: Three times a week    Attends Religious Services: More than 4 times per year    Active Member of Clubs or Organizations: Yes    Attends Banker Meetings: More than 4 times per year    Marital Status: Never married  Intimate Partner Violence: Not At Risk (02/10/2024)   Humiliation, Afraid, Rape, and Kick questionnaire    Fear of Current or Ex-Partner: No    Emotionally Abused: No    Physically Abused: No    Sexually Abused: No    Family History  Problem Relation Age of Onset   Hypertension Mother    Diabetes Mother    Stroke Mother    Heart disease Mother    Obesity Mother    Hypertension Father    Diabetes Father    Stroke Father    Obesity Father    Heart disease Father    Hyperlipidemia Father    Heart disease  Sister    Diabetes Brother    Hypertension Brother    Gout Brother    Breast cancer Cousin    Cancer Neg Hx     Past Surgical History:  Procedure Laterality Date   ABDOMINAL HYSTERECTOMY  2006    HERNIA REPAIR  2006    Umbilical     ROS: Review of Systems Negative except as stated above  PHYSICAL EXAM: BP 119/70 (BP Location: Right Arm, Patient Position: Sitting, Cuff Size: Large)   Pulse 82   Ht 5' 2 (1.575 m)   Wt 271 lb (122.9 kg)   SpO2 100%   BMI 49.57 kg/m   Wt Readings from Last 3 Encounters:  06/14/24 271 lb (122.9 kg)  04/28/24 278 lb 3.2 oz (126.2 kg)  04/02/24 280 lb (127 kg)    Physical Exam  General appearance - alert, well appearing, and in no distress Mental status - normal mood, behavior, speech, dress, motor activity, and thought processes Neck - supple, no significant adenopathy.  No thyromegaly or thyroid  nodules appreciated. Chest - clear to auscultation, no wheezes, rales or rhonchi, symmetric air entry Heart - normal rate, regular rhythm, normal S1, S2, no murmurs, rubs, clicks or gallops Extremities - peripheral pulses normal, no pedal edema, no clubbing or cyanosis      Latest Ref Rng & Units 03/05/2024   12:03 PM 02/10/2024    4:46 PM 08/12/2023    4:56 PM  CMP  Glucose 70 - 99 mg/dL 899  96    BUN 8 - 23 mg/dL 19  22    Creatinine 9.55 - 1.00 mg/dL 9.09  9.09    Sodium 864 - 145 mmol/L 137  139    Potassium 3.5 - 5.1 mmol/L 4.2  4.4    Chloride 98 - 111 mmol/L 101  98    CO2 20 - 29 mmol/L  24    Calcium  8.7 - 10.3 mg/dL  9.6  Total Protein 6.0 - 8.5 g/dL   6.5   Total Bilirubin 0.0 - 1.2 mg/dL   0.6   Alkaline Phos 44 - 121 IU/L   80   AST 0 - 40 IU/L   18   ALT 0 - 32 IU/L   15    Lipid Panel     Component Value Date/Time   CHOL 140 08/12/2023 1656   TRIG 109 08/12/2023 1656   HDL 71 08/12/2023 1656   CHOLHDL 2.0 08/12/2023 1656   CHOLHDL 2.0 11/10/2014 1148   VLDL 21 11/10/2014 1148   LDLCALC 50 08/12/2023 1656     CBC    Component Value Date/Time   WBC 5.9 03/05/2024 1154   RBC 4.45 03/05/2024 1154   HGB 12.9 03/05/2024 1203   HGB 11.7 08/12/2023 1656   HCT 38.0 03/05/2024 1203   HCT 37.5 08/12/2023 1656   PLT 373 03/05/2024 1154   PLT 364 08/12/2023 1656   MCV 80.7 03/05/2024 1154   MCV 81 08/12/2023 1656   MCH 25.8 (L) 03/05/2024 1154   MCHC 32.0 03/05/2024 1154   RDW 15.6 (H) 03/05/2024 1154   RDW 16.0 (H) 08/12/2023 1656   LYMPHSABS 2.5 06/24/2022 1015   LYMPHSABS 3.0 12/29/2018 1546   MONOABS 0.7 06/24/2022 1015   EOSABS 0.2 06/24/2022 1015   EOSABS 0.4 12/29/2018 1546   BASOSABS 0.1 06/24/2022 1015   BASOSABS 0.1 12/29/2018 1546    ASSESSMENT AND PLAN: 1. Type 2 diabetes mellitus with morbid obesity (HCC) (Primary) Commended her on weight loss so far and decreased appetite.  She will continue the Mounjaro  2.5 mg until she has completed 4 weeks of it.  If she is still tolerating the medicine at that time, we will plan to increase to the 5 mg.  She will send me a MyChart message. - POCT glycosylated hemoglobin (Hb A1C) - POCT glucose (manual entry)  2. Long-term (current) use of injectable non-insulin  antidiabetic drugs See #1 above  3. Hypertension associated with type 2 diabetes mellitus (HCC) At goal.  Continue Cozaar /HCTZ 100/25 mg once a day, hydralazine  100 mg twice a day and Norvasc  5 mg daily.  Goal is to hopefully get her off Norvasc  with further weight loss - losartan -hydrochlorothiazide  (HYZAAR ) 100-25 MG tablet; TAKE 1 TABLET BY MOUTH DAILY.  Dispense: 90 tablet; Refill: 1  4. Urge incontinence - solifenacin  (VESICARE ) 10 MG tablet; Take 1 tablet (10 mg total) by mouth daily.  Dispense: 90 tablet; Refill: 1  5. Statin intolerance  6. Multiple thyroid  nodules - TSH+T4F+T3Free - US  THYROID ; Future  7. Cold intolerance - TSH+T4F+T3Free  8.  Osteoarthritis of the knees Hopefully she is able to obtain sufficient weight loss with Mounjaro  to allow for her to  have knee replacement surgery in the future.   Patient was given the opportunity to ask questions.  Patient verbalized understanding of the plan and was able to repeat key elements of the plan.   This documentation was completed using Paediatric nurse.  Any transcriptional errors are unintentional.  Orders Placed This Encounter  Procedures   US  THYROID    TSH+T4F+T3Free   POCT glycosylated hemoglobin (Hb A1C)   POCT glucose (manual entry)     Requested Prescriptions   Signed Prescriptions Disp Refills   losartan -hydrochlorothiazide  (HYZAAR ) 100-25 MG tablet 90 tablet 1    Sig: TAKE 1 TABLET BY MOUTH DAILY.   solifenacin  (VESICARE ) 10 MG tablet 90 tablet 1    Sig: Take 1 tablet (10 mg  total) by mouth daily.    Return in about 4 months (around 10/15/2024).  Barnie Louder, MD, FACP

## 2024-06-14 NOTE — Patient Instructions (Signed)
 VISIT SUMMARY:  Today, we discussed your diabetes management, weight loss progress, and other health concerns. Your blood sugar levels are well controlled with Mounjaro , and you have lost 9 pounds since April. We also reviewed your blood pressure, arthritis management, thyroid  health, and urinary incontinence.  YOUR PLAN:  -TYPE 2 DIABETES MELLITUS: Type 2 diabetes is a condition where your body does not use insulin  properly, leading to high blood sugar levels. Your blood sugar is well controlled with Mounjaro , and your A1c is 5.9%. Continue taking Mounjaro  2.5 mg weekly, and we will assess your tolerance after one month to consider increasing the dose to 5 mg. Please monitor your blood glucose levels regularly and contact your insurance regarding Mounjaro  coverage.  -OBESITY: Obesity is a condition characterized by excessive body weight. You have lost 9 pounds since April, which is great progress. Continue with lifestyle modifications to further support your weight loss goals.  -HYPERTENSION: Hypertension is high blood pressure. Your blood pressure is well controlled with your current medications. Continue your current regimen, and we may consider discontinuing amlodipine  as you lose more weight.  -OSTEOARTHRITIS: Osteoarthritis is a joint condition causing pain and stiffness. Your pain is managed with tramadol  and topical treatments. Continue these treatments as needed, and focus on weight loss to qualify for knee surgery.  -MULTINODULAR THYROID : A multinodular thyroid  means there are multiple nodules on your thyroid  gland. We will order a thyroid  ultrasound and perform thyroid  function tests to evaluate this further.  -URINARY INCONTINENCE: Urinary incontinence is the loss of bladder control. Continue using VESIcare  for bladder incontinence, and we will refill your prescription.  INSTRUCTIONS:  Please follow up in one month to assess your tolerance to Mounjaro  and discuss the results of your  thyroid  ultrasound and function tests. Continue monitoring your blood glucose levels and contact your insurance regarding Mounjaro  coverage.

## 2024-06-15 ENCOUNTER — Ambulatory Visit: Payer: Self-pay | Admitting: Internal Medicine

## 2024-06-15 DIAGNOSIS — E042 Nontoxic multinodular goiter: Secondary | ICD-10-CM

## 2024-06-15 LAB — TSH+T4F+T3FREE
Free T4: 1.49 ng/dL (ref 0.82–1.77)
T3, Free: 2.9 pg/mL (ref 2.0–4.4)
TSH: 1.38 u[IU]/mL (ref 0.450–4.500)

## 2024-06-16 ENCOUNTER — Inpatient Hospital Stay
Admission: RE | Admit: 2024-06-16 | Discharge: 2024-06-16 | Disposition: A | Discharge status: Home / Self Care | Source: Ambulatory Visit | Attending: Internal Medicine | Admitting: Internal Medicine

## 2024-06-16 DIAGNOSIS — E042 Nontoxic multinodular goiter: Secondary | ICD-10-CM | POA: Diagnosis not present

## 2024-06-18 ENCOUNTER — Other Ambulatory Visit: Payer: Self-pay | Admitting: Internal Medicine

## 2024-06-18 DIAGNOSIS — E042 Nontoxic multinodular goiter: Secondary | ICD-10-CM

## 2024-06-18 NOTE — Telephone Encounter (Signed)
 Phone call placed to patient this morning to go over the results of the thyroid  ultrasound.  This showed that she has multinodular goiter.  However 2 nodules meet criteria for biopsy 1 in the right mid gland and the other 1 in the left inferior gland.  Advised patient of how it is done through interventional radiology as a FNA then cells are sent to be evaluated by the pathologist to see if there are any cancerous cells.  Patient is agreeable to moving forward with this.

## 2024-06-25 ENCOUNTER — Other Ambulatory Visit (HOSPITAL_COMMUNITY)
Admission: RE | Admit: 2024-06-25 | Discharge: 2024-06-25 | Disposition: A | Discharge status: Home / Self Care | Source: Ambulatory Visit | Attending: Student | Admitting: Student

## 2024-06-25 ENCOUNTER — Ambulatory Visit
Admission: RE | Admit: 2024-06-25 | Discharge: 2024-06-25 | Disposition: A | Discharge status: Home / Self Care | Source: Ambulatory Visit | Attending: Internal Medicine | Admitting: Internal Medicine

## 2024-06-25 DIAGNOSIS — E042 Nontoxic multinodular goiter: Secondary | ICD-10-CM

## 2024-06-25 DIAGNOSIS — E041 Nontoxic single thyroid nodule: Secondary | ICD-10-CM | POA: Diagnosis not present

## 2024-06-28 ENCOUNTER — Other Ambulatory Visit: Payer: Self-pay

## 2024-06-28 ENCOUNTER — Other Ambulatory Visit: Payer: Self-pay | Admitting: Internal Medicine

## 2024-06-28 DIAGNOSIS — E1169 Type 2 diabetes mellitus with other specified complication: Secondary | ICD-10-CM

## 2024-06-29 LAB — CYTOLOGY - NON PAP

## 2024-06-30 ENCOUNTER — Other Ambulatory Visit: Payer: Self-pay

## 2024-06-30 ENCOUNTER — Encounter: Payer: Self-pay | Admitting: Internal Medicine

## 2024-06-30 ENCOUNTER — Ambulatory Visit: Payer: Self-pay | Admitting: Internal Medicine

## 2024-06-30 ENCOUNTER — Other Ambulatory Visit: Payer: Self-pay | Admitting: Internal Medicine

## 2024-06-30 MED ORDER — TIRZEPATIDE 5 MG/0.5ML ~~LOC~~ SOAJ
5.0000 mg | SUBCUTANEOUS | 1 refills | Status: DC
  Filled 2024-06-30: qty 6, 84d supply, fill #0
  Filled 2024-06-30: qty 2, 28d supply, fill #0
  Filled 2024-06-30: qty 6, 84d supply, fill #0

## 2024-07-01 ENCOUNTER — Other Ambulatory Visit: Payer: Self-pay

## 2024-07-05 ENCOUNTER — Other Ambulatory Visit: Payer: Self-pay

## 2024-07-06 ENCOUNTER — Other Ambulatory Visit: Payer: Self-pay

## 2024-07-26 ENCOUNTER — Encounter: Payer: Self-pay | Admitting: Internal Medicine

## 2024-07-26 ENCOUNTER — Other Ambulatory Visit: Payer: Self-pay

## 2024-07-26 ENCOUNTER — Other Ambulatory Visit: Payer: Self-pay | Admitting: Internal Medicine

## 2024-07-26 MED ORDER — TIRZEPATIDE 7.5 MG/0.5ML ~~LOC~~ SOAJ
7.5000 mg | SUBCUTANEOUS | 0 refills | Status: DC
  Filled 2024-07-26: qty 2, 28d supply, fill #0

## 2024-07-29 ENCOUNTER — Other Ambulatory Visit: Payer: Self-pay

## 2024-08-04 ENCOUNTER — Other Ambulatory Visit: Payer: Self-pay

## 2024-08-17 ENCOUNTER — Other Ambulatory Visit: Payer: Self-pay | Admitting: Internal Medicine

## 2024-08-17 DIAGNOSIS — M170 Bilateral primary osteoarthritis of knee: Secondary | ICD-10-CM

## 2024-08-17 DIAGNOSIS — Z79891 Long term (current) use of opiate analgesic: Secondary | ICD-10-CM

## 2024-08-18 NOTE — Telephone Encounter (Signed)
 Requested medication (s) are due for refill today: yes  Requested medication (s) are on the active medication list: yes  Last refill:  05/22/24  Future visit scheduled: yes  Notes to clinic:  Unable to refill per protocol, cannot delegate.       Requested Prescriptions  Pending Prescriptions Disp Refills   traMADol  (ULTRAM ) 50 MG tablet 60 tablet 1    Sig: Take 1 tablet (50 mg total) by mouth every 12 (twelve) hours as needed.     Not Delegated - Analgesics:  Opioid Agonists Failed - 08/18/2024 12:35 PM      Failed - This refill cannot be delegated      Passed - Urine Drug Screen completed in last 360 days      Passed - Valid encounter within last 3 months    Recent Outpatient Visits           2 months ago Type 2 diabetes mellitus with morbid obesity (HCC)   [facility] Dept Of [place]. [facility] Vicci Sober B, MD   3 months ago Type 2 diabetes mellitus with morbid obesity [facility])   [facility] - A Dept Of [place]. [facility] Vicci Sober NOVAK, MD   4 months ago Accident due to mechanical fall without injury, initial encounter   [facility] - A Dept Of [place]. [facility] Vicci Sober B, MD   6 months ago Type 2 diabetes mellitus with morbid obesity [facility])   [facility] - A Dept Of [place]. [facility] Vicci Sober B, MD   1 year ago Type 2 diabetes mellitus with morbid obesity [facility])   [facility] - A Dept Of [place]. [facility] Vicci Sober NOVAK, MD       Future Appointments             In 1 month Vicci Sober NOVAK, MD [facility] - A Dept Of [place]. [facility] [place]

## 2024-08-19 MED ORDER — TRAMADOL HCL 50 MG PO TABS
50.0000 mg | ORAL_TABLET | Freq: Two times a day (BID) | ORAL | 1 refills | Status: DC | PRN
  Filled 2024-08-19: qty 60, 30d supply, fill #0
  Filled 2024-09-29: qty 60, 30d supply, fill #1

## 2024-08-20 ENCOUNTER — Other Ambulatory Visit: Payer: Self-pay

## 2024-08-23 ENCOUNTER — Encounter: Payer: Self-pay | Admitting: Internal Medicine

## 2024-08-23 ENCOUNTER — Other Ambulatory Visit: Payer: Self-pay | Admitting: Internal Medicine

## 2024-08-23 MED ORDER — TIRZEPATIDE 10 MG/0.5ML ~~LOC~~ SOAJ
10.0000 mg | SUBCUTANEOUS | 0 refills | Status: DC
  Filled 2024-08-23: qty 2, 28d supply, fill #0

## 2024-08-24 ENCOUNTER — Other Ambulatory Visit: Payer: Self-pay

## 2024-08-25 ENCOUNTER — Other Ambulatory Visit: Payer: Self-pay

## 2024-09-07 ENCOUNTER — Other Ambulatory Visit: Payer: Self-pay

## 2024-09-08 ENCOUNTER — Other Ambulatory Visit: Payer: Self-pay

## 2024-09-20 ENCOUNTER — Encounter: Payer: Self-pay | Admitting: Orthopedic Surgery

## 2024-09-20 ENCOUNTER — Encounter: Payer: Self-pay | Admitting: Internal Medicine

## 2024-09-20 ENCOUNTER — Other Ambulatory Visit: Payer: Self-pay

## 2024-09-20 ENCOUNTER — Other Ambulatory Visit: Payer: Self-pay | Admitting: Internal Medicine

## 2024-09-20 DIAGNOSIS — M170 Bilateral primary osteoarthritis of knee: Secondary | ICD-10-CM

## 2024-09-20 MED ORDER — TIRZEPATIDE 12.5 MG/0.5ML ~~LOC~~ SOAJ
12.5000 mg | SUBCUTANEOUS | 0 refills | Status: DC
  Filled 2024-09-20: qty 2, 28d supply, fill #0

## 2024-09-21 ENCOUNTER — Other Ambulatory Visit: Payer: Self-pay

## 2024-09-21 NOTE — Telephone Encounter (Signed)
 Gel injection referral placed in chart

## 2024-09-21 NOTE — Telephone Encounter (Signed)
 Yes  thx

## 2024-09-29 ENCOUNTER — Other Ambulatory Visit: Payer: Self-pay

## 2024-09-30 ENCOUNTER — Other Ambulatory Visit: Payer: Self-pay

## 2024-10-01 ENCOUNTER — Other Ambulatory Visit: Payer: Self-pay

## 2024-10-11 ENCOUNTER — Encounter: Payer: Self-pay | Admitting: Radiology

## 2024-10-15 ENCOUNTER — Ambulatory Visit: Attending: Internal Medicine | Admitting: Internal Medicine

## 2024-10-15 ENCOUNTER — Encounter: Payer: Self-pay | Admitting: Internal Medicine

## 2024-10-15 DIAGNOSIS — Z7985 Long-term (current) use of injectable non-insulin antidiabetic drugs: Secondary | ICD-10-CM

## 2024-10-15 DIAGNOSIS — E1169 Type 2 diabetes mellitus with other specified complication: Secondary | ICD-10-CM

## 2024-10-15 DIAGNOSIS — Z23 Encounter for immunization: Secondary | ICD-10-CM

## 2024-10-15 DIAGNOSIS — I10 Essential (primary) hypertension: Secondary | ICD-10-CM

## 2024-10-15 DIAGNOSIS — E1159 Type 2 diabetes mellitus with other circulatory complications: Secondary | ICD-10-CM

## 2024-10-15 DIAGNOSIS — Z6841 Body Mass Index (BMI) 40.0 and over, adult: Secondary | ICD-10-CM

## 2024-10-15 DIAGNOSIS — T383X5A Adverse effect of insulin and oral hypoglycemic [antidiabetic] drugs, initial encounter: Secondary | ICD-10-CM

## 2024-10-15 DIAGNOSIS — M170 Bilateral primary osteoarthritis of knee: Secondary | ICD-10-CM | POA: Diagnosis not present

## 2024-10-15 DIAGNOSIS — K5903 Drug induced constipation: Secondary | ICD-10-CM | POA: Diagnosis not present

## 2024-10-15 DIAGNOSIS — I152 Hypertension secondary to endocrine disorders: Secondary | ICD-10-CM

## 2024-10-15 LAB — POCT GLYCOSYLATED HEMOGLOBIN (HGB A1C): HbA1c, POC (controlled diabetic range): 5.4 % (ref 0.0–7.0)

## 2024-10-15 LAB — GLUCOSE, POCT (MANUAL RESULT ENTRY): POC Glucose: 103 mg/dL — AB (ref 70–99)

## 2024-10-15 NOTE — Progress Notes (Signed)
 Patient ID: Linda Cooper, female    DOB: 04-10-1961  MRN: [id number]  CC: Diabetes (DM & HTN f/u. Thompson Mounjaro  dosage - experiencing nausea/Low BP readings - 90/50 approx /Flu vax administered on 10/15/24 - C.A.)   Subjective: Linda Cooper is a 63 y.o. female who presents for chronic ds management. Her concerns today include:  Hx of HTN, DM, morbid obesity, statin intolerance/myopathy and OA knees, MNG 06/2024 (2 nodules bx benign follicular findings)   Discussed the use of AI scribe software for clinical note transcription with the patient, who gave verbal consent to proceed.  History of Present Illness Linda Cooper is a 63 year old female with diabetes, hypertension, and osteoarthritis who presents for a four-month follow-up visit for chronic disease management.  DM/Obesity:  Lab Results  Component Value Date   HGBA1C 5.4 10/15/2024    She has been managing her diabetes with Mounjaro , currently at a 12.5 mg dose. Initially, she experienced gastrointestinal side effects when dose was increased to 12.5 mg, including an unsettled stomach and constipation, which have improved over the past week. She uses ginger tea or ginger ale for nausea and prunes, Colace, water, vegetables, and apples for constipation. Despite these measures, she has bowel movements every four days, requiring straining. She has lost 34 pounds since July, with her weight decreasing from 271 pounds to 237 pounds. Her A1c has improved from 5.9% in July to 5.4% currently, and her blood sugar levels range between 90 and 110 mg/dL. She is no longer on metformin .  HTN:  she is prescribed Cozaar  hydrochlorothiazide  100/25 mg daily, hydralazine  100 mg twice a day, and amlodipine  5 mg daily. She has not been taking amlodipine  regularly, choosing to skip it when her blood pressure is below 110/70 mmHg. She experiences occasional lightheadedness when her blood pressure is low, with readings sometimes in the 90s/60s  mmHg range. Her blood pressure has been as high as 140/90 mmHg.  OA knees: she is considering knee surgery but is currently focusing on weight loss and is scheduled for gel injections next week with ortho She reports some improvement in mobility and occasionally forgets her cane. She continues to take tramadol  for pain management, which she finds helpful.  HM: Over due for MMG which was ordered earlier this yr. She plans to call and schedule her MMG before end of this. Yes to flu vaccine; wants to come back for PCV and shingles. Has FIT kit at home still. Promises to use it and bring it in before the end of the year  Patient Active Problem List   Diagnosis Date Noted   Postconcussion syndrome 04/28/2024   BPPV (benign paroxysmal positional vertigo) 04/28/2024   Hypertension associated with diabetes (HCC) 08/13/2023   Type 2 diabetes mellitus with morbid obesity (HCC) 08/13/2023   Iron deficiency anemia 08/13/2023   Urge incontinence 08/31/2021   Pain, dental 08/16/2021   Trigger middle finger of left hand 11/08/2019   Carpal tunnel syndrome of left wrist 07/09/2019   Morbid obesity with body mass index (BMI) of 50.0 to 59.9 in adult (HCC) 10/16/2018   Morbid obesity (HCC) 11/11/2014   PCOS (polycystic ovarian syndrome) 11/10/2014   Degenerative arthritis of knee, bilateral 11/10/2014     Current Outpatient Medications on File Prior to Visit  Medication Sig Dispense Refill   albuterol  (VENTOLIN  HFA) 108 (90 Base) MCG/ACT inhaler Inhale 2 puffs into the lungs every 4 (four) hours as needed for wheezing. 6.7 g 1   amLODipine  (  NORVASC ) 5 MG tablet Take 1 tablet (5 mg total) by mouth daily. 90 tablet 1   Blood Glucose Monitoring Suppl (ONETOUCH VERIO FLEX SYSTEM) w/Device KIT Use to check blood sugar three times daily. 1 kit 0   glucose blood (ONETOUCH VERIO) test strip Use to check blood sugar three times daily. 100 each 6   hydrALAZINE  (APRESOLINE ) 100 MG tablet Take 1 tablet (100 mg total)  by mouth 2 (two) times daily. 180 tablet 3   Lancets (ONETOUCH DELICA PLUS LANCET33G) MISC Use to check blood sugar three times daily. 100 each 6   losartan -hydrochlorothiazide  (HYZAAR ) 100-25 MG tablet TAKE 1 TABLET BY MOUTH DAILY. 90 tablet 1   Multiple Vitamins-Minerals (MULTIVITAMIN WITH MINERALS) tablet Take 1 tablet by mouth daily.     NON FORMULARY SEGA PRO BLADDER HEALTH 100 MG DAILY     solifenacin  (VESICARE ) 10 MG tablet Take 1 tablet (10 mg total) by mouth daily. 90 tablet 1   tirzepatide  (MOUNJARO ) 12.5 MG/0.5ML Pen Inject 12.5 mg into the skin once a week. 2 mL 0   traMADol  (ULTRAM ) 50 MG tablet Take 1 tablet (50 mg total) by mouth every 12 (twelve) hours as needed. 60 tablet 1   diazepam  (VALIUM ) 5 MG tablet Take 1 tablet (5 mg total) by mouth 2 (two) times daily. (Patient not taking: Reported on 10/15/2024) 10 tablet 0   meclizine  (ANTIVERT ) 25 MG tablet Take 1 tablet (25 mg total) by mouth daily as needed for dizziness. (Patient not taking: Reported on 10/15/2024) 20 tablet 0   No current facility-administered medications on file prior to visit.    Allergies  Allergen Reactions   Ace Inhibitors Cough   Azithromycin  Nausea And Vomiting   Erythromycin Base     Cramping in abdomen is reaction/no rash   Lisinopril Cough    Social History   Socioeconomic History   Marital status: Single    Spouse name: Not on file   Number of children: 1    Years of education: college    Highest education level: Not on file  Occupational History   Occupation: Technical Brewer    Comment: [employer], in home care,   Tobacco Use   Smoking status: Never   Smokeless tobacco: Never  Vaping Use   Vaping status: Never Used  Substance and Sexual Activity   Alcohol use: No   Drug use: No   Sexual activity: Not Currently  Other Topics Concern   Not on file  Social History Narrative   Lives at home with son.    Son is 21.       She is motivated to lose weight and improve health by the  expected death of her father  Jan 04, 2017) and the sudden deaths of her cousin (found dead in car one month after death of his wife Mar 04, 2017)  and his wife (died after childbirth 02/04/17)    Exercise: minimal    Social Drivers of Health   Financial Resource Strain: Medium Risk (02/10/2024)   Overall Financial Resource Strain (CARDIA)    Difficulty of Paying Living Expenses: Somewhat hard  Food Insecurity: No Food Insecurity (02/10/2024)   Hunger Vital Sign    Worried About Running Out of Food in the Last Year: Never true    Ran Out of Food in the Last Year: Never true  Transportation Needs: No Transportation Needs (02/10/2024)   PRAPARE - Administrator, Civil Service (Medical): No    Lack of Transportation (Non-Medical): No  Physical Activity: Inactive (02/10/2024)   Exercise Vital Sign    Days of Exercise per Week: 0 days    Minutes of Exercise per Session: 0 min  Stress: No Stress Concern Present (02/10/2024)   [employer] of Occupational Health - Occupational Stress Questionnaire    Feeling of Stress : Only a little  Social Connections: Moderately Integrated (02/10/2024)   Social Connection and Isolation Panel    Frequency of Communication with Friends and Family: Three times a week    Frequency of Social Gatherings with Friends and Family: Three times a week    Attends Religious Services: More than 4 times per year    Active Member of Clubs or Organizations: Yes    Attends Banker Meetings: More than 4 times per year    Marital Status: Never married  Intimate Partner Violence: Not At Risk (02/10/2024)   Humiliation, Afraid, Rape, and Kick questionnaire    Fear of Current or Ex-Partner: No    Emotionally Abused: No    Physically Abused: No    Sexually Abused: No    Family History  Problem Relation Age of Onset   Hypertension Mother    Diabetes Mother    Stroke Mother    Heart disease Mother    Obesity Mother    Hypertension Father    Diabetes Father     Stroke Father    Obesity Father    Heart disease Father    Hyperlipidemia Father    Heart disease Sister    Diabetes Brother    Hypertension Brother    Gout Brother    Breast cancer Cousin    Cancer Neg Hx     Past Surgical History:  Procedure Laterality Date   ABDOMINAL HYSTERECTOMY  2006    HERNIA REPAIR  2006    Umbilical     ROS: Review of Systems Negative except as stated above  PHYSICAL EXAM: BP 135/82   Pulse 88   Ht 5' 2 (1.575 m)   Wt 237 lb (107.5 kg)   SpO2 97%   BMI 43.35 kg/m   Wt Readings from Last 3 Encounters:  10/15/24 237 lb (107.5 kg)  06/14/24 271 lb (122.9 kg)  04/28/24 278 lb 3.2 oz (126.2 kg)    Physical Exam  General appearance - alert, well appearing, morbidly obese AAF and in no distress Mental status - normal mood, behavior, speech, dress, motor activity, and thought processes Neck - supple, no significant adenopathy Chest - clear to auscultation, no wheezes, rales or rhonchi, symmetric air entry Heart - normal rate, regular rhythm, normal S1, S2, no murmurs, rubs, clicks or gallops Extremities - trace LE edema      Latest Ref Rng & Units 10/15/2024    3:08 PM 03/05/2024   12:03 PM 02/10/2024    4:46 PM  CMP  Glucose 70 - 99 mg/dL 85  899  96   BUN 8 - 27 mg/dL 13  19  22    Creatinine 0.57 - 1.00 mg/dL 9.09  9.09  9.09   Sodium 134 - 144 mmol/L 136  137  139   Potassium 3.5 - 5.2 mmol/L 3.8  4.2  4.4   Chloride 96 - 106 mmol/L 94  101  98   CO2 20 - 29 mmol/L 27   24   Calcium  8.7 - 10.3 mg/dL 9.9   9.6   Total Protein 6.0 - 8.5 g/dL 6.8     Total Bilirubin 0.0 - 1.2 mg/dL 0.8  Alkaline Phos 49 - 135 IU/L 65     AST 0 - 40 IU/L 20     ALT 0 - 32 IU/L 15      Lipid Panel     Component Value Date/Time   CHOL 147 10/15/2024 1508   TRIG 63 10/15/2024 1508   HDL 71 10/15/2024 1508   CHOLHDL 2.1 10/15/2024 1508   CHOLHDL 2.0 11/10/2014 1148   VLDL 21 11/10/2014 1148   LDLCALC 63 10/15/2024 1508    CBC    Component  Value Date/Time   WBC 6.7 10/15/2024 1508   WBC 5.9 03/05/2024 1154   RBC 4.51 10/15/2024 1508   RBC 4.45 03/05/2024 1154   HGB 11.9 10/15/2024 1508   HCT 37.1 10/15/2024 1508   PLT 436 10/15/2024 1508   MCV 82 10/15/2024 1508   MCH 26.4 (L) 10/15/2024 1508   MCH 25.8 (L) 03/05/2024 1154   MCHC 32.1 10/15/2024 1508   MCHC 32.0 03/05/2024 1154   RDW 16.1 (H) 10/15/2024 1508   LYMPHSABS 2.5 06/24/2022 1015   LYMPHSABS 3.0 12/29/2018 1546   MONOABS 0.7 06/24/2022 1015   EOSABS 0.2 06/24/2022 1015   EOSABS 0.4 12/29/2018 1546   BASOSABS 0.1 06/24/2022 1015   BASOSABS 0.1 12/29/2018 1546    ASSESSMENT AND PLAN: 1. Type 2 diabetes mellitus with morbid obesity (HCC) (Primary) Pt with significant wgh loss so far on Mounjaro . Reports some increase constipation and stomach upset with increase dose of Mounjaro . I recommend that we decrease dose back to 10 mg but pt declines stating the stomach up has leveled off. I recommend adding Linzess to help dec the constipation but pt again declined as she does not want to have to take another med. She will continue to use prunes and Colace - POCT glucose (manual entry) - POCT glycosylated hemoglobin (Hb A1C) - CBC - Comprehensive metabolic panel with GFR - Lipid panel - Microalbumin / creatinine urine ratio - Ambulatory referral to Ophthalmology  2. Long-term (current) use of injectable non-insulin  antidiabetic drugs See #1 above  3. Hypertension associated with type 2 diabetes mellitus (HCC) Both BP readings done in office today close to but not at goal. Reports some lows at times at home. Advised to continue Cozaar /HCTZ and hydralazine .  Should also continue amlodipine  if blood pressure remains greater than 130/80.  Could also consider cutting the dose of amlodipine  5 mg in half if she is afraid to take the whole pill.   4. Primary osteoarthritis of both knees Weight loss has helped.  Would like to have knee replacement once BMI gets  within acceptable range.  5. Drug induced constipation Due to Mounjaro  and likely tramadol  as well.  See discussion under #1.  6. Need for immunization against influenza Given today.  HM: Strongly advised her to call and schedule her mammogram for breast cancer screening.  Encouraged her to please turn in the FIT kit before the end of the year.  Advised that she can get the pneumonia and shingles vaccine at pharmacy downstairs or at any outside pharmacy as Medicare may not cover for us  administering it to her. She expressed understanding and states she will do so.   Patient was given the opportunity to ask questions.  Patient verbalized understanding of the plan and was able to repeat key elements of the plan.   This documentation was completed using Paediatric nurse.  Any transcriptional errors are unintentional.  Orders Placed This Encounter  Procedures   Flu vaccine  HIGH DOSE PF(Fluzone Trivalent)   CBC   Comprehensive metabolic panel with GFR   Lipid panel   Microalbumin / creatinine urine ratio   Ambulatory referral to Ophthalmology   POCT glucose (manual entry)   POCT glycosylated hemoglobin (Hb A1C)     Requested Prescriptions    No prescriptions requested or ordered in this encounter    Return in about 6 weeks (around 11/26/2024) for 6 wks with me for Welcome To Medicare visit.  Barnie Louder, MD, FACP

## 2024-10-15 NOTE — Patient Instructions (Addendum)
 PLEASE USE the stool kit and bring it back it before the end of the year. Please call and schedule your mammogram with [facility] before the end of the year.  VISIT SUMMARY: During your visit today, we reviewed your chronic conditions, including diabetes, hypertension, and osteoarthritis. We discussed your current medications, recent improvements, and any side effects you have been experiencing. We also addressed your general health maintenance needs, including overdue screenings and vaccinations.  YOUR PLAN: -TYPE 2 DIABETES MELLITUS: Type 2 diabetes is a condition where your body does not use insulin  properly, leading to high blood sugar levels. Your diabetes is currently managed with Mounjaro  12.5 mg, which has helped improve your A1c to 5.4 and resulted in significant weight loss. Continue taking Mounjaro  12.5 mg for another month. Monitor for any vomiting or abdominal pain, ensure you are eating enough, and keep an eye on your blood sugar levels. Report any frequent low blood sugar episodes.  -ESSENTIAL HYPERTENSION: Hypertension, or high blood pressure, is when the force of blood against your artery walls is too high. Your blood pressure is managed with Cozaar , hydrochlorothiazide , and hydralazine . You have been advised to take amlodipine  if your blood pressure is above 130/80 mmHg and consider a half-dose if needed. Recheck your blood pressure regularly and limit your salt intake.  -BILATERAL PRIMARY OSTEOARTHRITIS OF KNEE: Osteoarthritis is a condition that causes the cartilage in your joints to break down, leading to pain and stiffness. Your symptoms have improved with weight loss, and you are scheduled for gel injections next week. Continue taking tramadol  for pain and monitor how your symptoms improve with weight loss.  -DRUG-INDUCED CONSTIPATION: Constipation can be a side effect of medications like Mounjaro  and tramadol . Despite dietary changes and using Colace, you are still  experiencing constipation. Continue with your current dietary modifications, prunes, Colace, and increased water intake. Monitor your bowel movement frequency and adjust your diet as needed.  -GENERAL HEALTH MAINTENANCE: You are overdue for a mammogram and colon cancer screening. You received a flu shot today and are due for a diabetic eye exam, as well as pneumonia and shingles vaccines. Schedule your mammogram and complete the colon cancer screening with a stool kit before the end of the year. We will refer you for a diabetic eye exam, and you should obtain the pneumonia and shingles vaccines at an outside pharmacy.  INSTRUCTIONS: Please schedule your mammogram and complete the colon cancer screening with a stool kit before the end of the year. We will refer you for a diabetic eye exam. Obtain the pneumonia and shingles vaccines at an outside pharmacy.                      Contains text generated by Abridge.                                 Contains text generated by Abridge.

## 2024-10-16 ENCOUNTER — Encounter: Payer: Self-pay | Admitting: Internal Medicine

## 2024-10-16 ENCOUNTER — Ambulatory Visit: Payer: Self-pay | Admitting: Internal Medicine

## 2024-10-17 LAB — CBC
Hematocrit: 37.1 % (ref 34.0–46.6)
Hemoglobin: 11.9 g/dL (ref 11.1–15.9)
MCH: 26.4 pg — ABNORMAL LOW (ref 26.6–33.0)
MCHC: 32.1 g/dL (ref 31.5–35.7)
MCV: 82 fL (ref 79–97)
Platelets: 436 x10E3/uL (ref 150–450)
RBC: 4.51 x10E6/uL (ref 3.77–5.28)
RDW: 16.1 % — ABNORMAL HIGH (ref 11.7–15.4)
WBC: 6.7 x10E3/uL (ref 3.4–10.8)

## 2024-10-17 LAB — COMPREHENSIVE METABOLIC PANEL WITH GFR
ALT: 15 IU/L (ref 0–32)
AST: 20 IU/L (ref 0–40)
Albumin: 3.8 g/dL — ABNORMAL LOW (ref 3.9–4.9)
Alkaline Phosphatase: 65 IU/L (ref 49–135)
BUN/Creatinine Ratio: 14 (ref 12–28)
BUN: 13 mg/dL (ref 8–27)
Bilirubin Total: 0.8 mg/dL (ref 0.0–1.2)
CO2: 27 mmol/L (ref 20–29)
Calcium: 9.9 mg/dL (ref 8.7–10.3)
Chloride: 94 mmol/L — ABNORMAL LOW (ref 96–106)
Creatinine, Ser: 0.9 mg/dL (ref 0.57–1.00)
Globulin, Total: 3 g/dL (ref 1.5–4.5)
Glucose: 85 mg/dL (ref 70–99)
Potassium: 3.8 mmol/L (ref 3.5–5.2)
Sodium: 136 mmol/L (ref 134–144)
Total Protein: 6.8 g/dL (ref 6.0–8.5)
eGFR: 71 mL/min/1.73 (ref >59)

## 2024-10-17 LAB — LIPID PANEL
Chol/HDL Ratio: 2.1 ratio (ref 0.0–4.4)
Cholesterol, Total: 147 mg/dL (ref 100–199)
HDL: 71 mg/dL (ref >39)
LDL Chol Calc (NIH): 63 mg/dL (ref 0–99)
Triglycerides: 63 mg/dL (ref 0–149)
VLDL Cholesterol Cal: 13 mg/dL (ref 5–40)

## 2024-10-17 LAB — MICROALBUMIN / CREATININE URINE RATIO
Creatinine, Urine: 150.7 mg/dL
Microalb/Creat Ratio: 16 mg/g{creat} (ref 0–29)
Microalbumin, Urine: 23.8 ug/mL

## 2024-10-18 ENCOUNTER — Other Ambulatory Visit: Payer: Self-pay

## 2024-10-18 ENCOUNTER — Other Ambulatory Visit: Payer: Self-pay | Admitting: Internal Medicine

## 2024-10-19 ENCOUNTER — Other Ambulatory Visit: Payer: Self-pay

## 2024-10-19 MED ORDER — MOUNJARO 12.5 MG/0.5ML ~~LOC~~ SOAJ
12.5000 mg | SUBCUTANEOUS | 0 refills | Status: DC
  Filled 2024-10-19: qty 6, 84d supply, fill #0

## 2024-10-19 NOTE — Telephone Encounter (Signed)
 Requested medication (s) are due for refill today - yes  Requested medication (s) are on the active medication list -yes  Future visit scheduled -yes  Last refill: 09/20/24 2ml  Notes to clinic: off protocol- provider review   Requested Prescriptions  Pending Prescriptions Disp Refills   tirzepatide  (MOUNJARO ) 12.5 MG/0.5ML Pen 2 mL 0    Sig: Inject 12.5 mg into the skin once a week.     Off-Protocol Failed - 10/19/2024  4:12 PM      Failed - Medication not assigned to a protocol, review manually.      Passed - Valid encounter within last 12 months    Recent Outpatient Visits           4 days ago Type 2 diabetes mellitus with morbid obesity (HCC)   [facility] [place]. [facility] Vicci Sober B, MD   4 months ago Type 2 diabetes mellitus with morbid obesity [facility])   [facility] [facility] [facility] [place]. [facility] Vicci Sober B, MD   5 months ago Type 2 diabetes mellitus with morbid obesity [facility])   [place] [facility] Of [place]. [facility] Vicci Sober NOVAK, MD   6 months ago Accident due to mechanical fall without injury, initial encounter   [place] [facility] Of [place]. [facility] Vicci Sober B, MD   8 months ago Type 2 diabetes mellitus with morbid obesity [facility])   [facility] Of [place]. [facility] Vicci Sober NOVAK, MD       Future Appointments             In 3 days Magnant, Carlin CROME, PA-C [facility]               Requested Prescriptions  Pending Prescriptions Disp Refills   tirzepatide  (MOUNJARO ) 12.5 MG/0.5ML Pen 2 mL 0    Sig: Inject 12.5 mg into the skin once a week.     Off-Protocol Failed - 10/19/2024  4:12 PM      Failed - Medication not assigned to a protocol, review manually.      Passed - Valid encounter within last 12 months     Recent Outpatient Visits           4 days ago Type 2 diabetes mellitus with morbid obesity (HCC)   [facility] [facility] Of [place]. [facility] Vicci Sober B, MD   4 months ago Type 2 diabetes mellitus with morbid obesity [facility])   [place] [facility] Dept Of [place]. [facility] Vicci Sober B, MD   5 months ago Type 2 diabetes mellitus with morbid obesity [facility])   [place] [facility] Dept Of [place]. [facility] Vicci Sober NOVAK, MD   6 months ago Accident due to mechanical fall without injury, initial encounter   [place] [facility] - [facility] Dept Of [place]. [facility] Vicci Sober B, MD   8 months ago Type 2 diabetes mellitus with morbid obesity [facility])   [place] [facility] - A Dept Of . [facility] Vicci Sober NOVAK, MD       Future Appointments  In 3 days Magnant, Carlin CROME, PA-C [facility]

## 2024-10-20 ENCOUNTER — Other Ambulatory Visit: Payer: Self-pay

## 2024-10-22 ENCOUNTER — Encounter: Payer: Self-pay | Admitting: Surgical

## 2024-10-22 ENCOUNTER — Ambulatory Visit: Admitting: Surgical

## 2024-10-22 DIAGNOSIS — M170 Bilateral primary osteoarthritis of knee: Secondary | ICD-10-CM | POA: Diagnosis not present

## 2024-10-22 MED ORDER — LIDOCAINE HCL 1 % IJ SOLN
5.0000 mL | INTRAMUSCULAR | Status: AC | PRN
  Administered 2024-10-22: 5 mL

## 2024-10-22 MED ORDER — HYALURONAN 88 MG/4ML IX SOSY
88.0000 mg | PREFILLED_SYRINGE | INTRA_ARTICULAR | Status: AC | PRN
  Administered 2024-10-22: 88 mg via INTRA_ARTICULAR

## 2024-10-22 NOTE — Progress Notes (Signed)
   Procedure Note  Patient: Linda Cooper             Date of Birth: 1960/12/20           MRN: [id number]             Visit Date: 10/22/2024  Procedures: Visit Diagnoses:  1. Primary osteoarthritis of both knees     Large Joint Inj: bilateral knee on 10/22/2024 1:14 PM Indications: diagnostic evaluation, joint swelling and pain Details: 18 G 1.5 in needle, superolateral approach  Arthrogram: No  Medications (Right): 5 mL lidocaine  1 %; 88 mg Hyaluronan 88 MG/4ML Medications (Left): 5 mL lidocaine  1 %; 88 mg Hyaluronan 88 MG/4ML Outcome: tolerated well, no immediate complications Procedure, treatment alternatives, risks and benefits explained, specific risks discussed. Consent was given by the patient. Immediately prior to procedure a time out was called to verify the correct patient, procedure, equipment, support staff and site/side marked as required. Patient was prepped and draped in the usual sterile fashion.

## 2024-10-25 ENCOUNTER — Other Ambulatory Visit: Payer: Self-pay | Admitting: Pharmacist

## 2024-10-25 NOTE — Progress Notes (Signed)
 Pharmacy Quality Measure Review  This patient is appearing on a report for the adherence measure for diabetes medications this calendar year.   Medication: tirzepatide  Last fill date: 10/20/24 for 84 day supply  Insurance report was not up to date. No action needed at this time.   Herlene Fleeta Morris, PharmD, JAQUELINE, CPP Clinical Pharmacist [facility] [phone]

## 2024-10-31 ENCOUNTER — Other Ambulatory Visit: Payer: Self-pay | Admitting: Internal Medicine

## 2024-10-31 DIAGNOSIS — Z79891 Long term (current) use of opiate analgesic: Secondary | ICD-10-CM

## 2024-10-31 DIAGNOSIS — M170 Bilateral primary osteoarthritis of knee: Secondary | ICD-10-CM

## 2024-11-01 ENCOUNTER — Other Ambulatory Visit: Payer: Self-pay

## 2024-11-01 MED ORDER — TRAMADOL HCL 50 MG PO TABS
50.0000 mg | ORAL_TABLET | Freq: Two times a day (BID) | ORAL | 1 refills | Status: AC | PRN
  Filled 2024-11-01: qty 60, 30d supply, fill #0
  Filled 2024-12-14: qty 60, 30d supply, fill #1

## 2024-11-18 ENCOUNTER — Encounter: Payer: Self-pay | Admitting: Internal Medicine

## 2024-11-30 ENCOUNTER — Ambulatory Visit
Admission: RE | Admit: 2024-11-30 | Discharge: 2024-11-30 | Disposition: A | Discharge status: Home / Self Care | Source: Ambulatory Visit | Attending: Internal Medicine | Admitting: Internal Medicine

## 2024-11-30 DIAGNOSIS — Z1231 Encounter for screening mammogram for malignant neoplasm of breast: Secondary | ICD-10-CM

## 2024-12-07 ENCOUNTER — Other Ambulatory Visit: Payer: Self-pay

## 2024-12-07 ENCOUNTER — Other Ambulatory Visit: Payer: Self-pay | Admitting: Pharmacist

## 2024-12-07 MED ORDER — ACCU-CHEK GUIDE TEST VI STRP
ORAL_STRIP | 6 refills | Status: AC
  Filled 2024-12-07: qty 100, 33d supply, fill #0

## 2024-12-07 MED ORDER — ACCU-CHEK GUIDE W/DEVICE KIT
PACK | 0 refills | Status: AC
  Filled 2024-12-07: qty 1, 30d supply, fill #0

## 2024-12-07 MED ORDER — ACCU-CHEK SOFTCLIX LANCETS MISC
6 refills | Status: AC
  Filled 2024-12-07: qty 100, 33d supply, fill #0

## 2024-12-08 ENCOUNTER — Other Ambulatory Visit: Payer: Self-pay

## 2024-12-08 ENCOUNTER — Ambulatory Visit: Payer: Self-pay | Admitting: Internal Medicine

## 2024-12-10 ENCOUNTER — Other Ambulatory Visit: Payer: Self-pay | Admitting: Internal Medicine

## 2024-12-10 DIAGNOSIS — R928 Other abnormal and inconclusive findings on diagnostic imaging of breast: Secondary | ICD-10-CM

## 2024-12-14 ENCOUNTER — Other Ambulatory Visit: Payer: Self-pay

## 2024-12-17 ENCOUNTER — Encounter: Payer: Self-pay | Admitting: Internal Medicine

## 2024-12-17 ENCOUNTER — Ambulatory Visit: Attending: Internal Medicine | Admitting: Internal Medicine

## 2024-12-17 VITALS — BP 134/83 | HR 81 | Temp 97.9°F | Ht 62.0 in | Wt 223.0 lb

## 2024-12-17 DIAGNOSIS — Z0001 Encounter for general adult medical examination with abnormal findings: Secondary | ICD-10-CM

## 2024-12-17 DIAGNOSIS — Z1211 Encounter for screening for malignant neoplasm of colon: Secondary | ICD-10-CM

## 2024-12-17 DIAGNOSIS — Z7189 Other specified counseling: Secondary | ICD-10-CM

## 2024-12-17 DIAGNOSIS — Z0000 Encounter for general adult medical examination without abnormal findings: Secondary | ICD-10-CM

## 2024-12-17 DIAGNOSIS — E1159 Type 2 diabetes mellitus with other circulatory complications: Secondary | ICD-10-CM | POA: Diagnosis not present

## 2024-12-17 DIAGNOSIS — I10 Essential (primary) hypertension: Secondary | ICD-10-CM | POA: Diagnosis not present

## 2024-12-17 DIAGNOSIS — Z780 Asymptomatic menopausal state: Secondary | ICD-10-CM | POA: Diagnosis not present

## 2024-12-17 DIAGNOSIS — Z23 Encounter for immunization: Secondary | ICD-10-CM

## 2024-12-17 MED ORDER — PNEUMOCOCCAL 20-VAL CONJ VACC 0.5 ML IM SUSY: 0.5000 mL | PREFILLED_SYRINGE | INTRAMUSCULAR | 0 refills | Status: AC

## 2024-12-17 MED ORDER — ZOSTER VAC RECOMB ADJUVANTED 50 MCG/0.5ML IM SUSR: 0.5000 mL | Freq: Once | INTRAMUSCULAR | 0 refills | Status: AC

## 2024-12-17 NOTE — Patient Instructions (Signed)
 If you execute a living will or healthcare power of attorney, please bring a copy for our records.  I have given you prescription for the Shingrix vaccine.  This is a 2 vaccine series.  You will get 1 and then the second 1 you need to get 2 to 6 months later.  The vaccine can cause some soreness and redness at the injection site.  I have given you the prescription for the pneumonia vaccine called Prevnar 20.  You can get this at any pharmacy.  If you would like to get the COVID 19 booster, you can get this at your local pharmacy.  Please remember to use and turn in the stool card for colon cancer screening.

## 2024-12-17 NOTE — Progress Notes (Signed)
 "  Chief Complaint  Patient presents with   Welcome to [facility]    Welcome to [facility]     Subjective:   Linda Cooper is a 64 y.o. female who presents for a Welcome to Medicare Exam.  Hx of HTN, DM, morbid obesity, statin intolerance/myopathy and OA knees, MNG 06/2024 (2 nodules bx benign follicular findings)    Visit info / Clinical Intake: Medicare Wellness Visit Type:: Welcome to [employer]ERN[employer]IAL [employer]EARCH OFFICER) Persons participating in visit and providing information:: patient Medicare Wellness Visit Mode:: In-person (required for WTM) Interpreter Needed?: No Pre-visit prep was completed: yes AWV questionnaire completed by patient prior to visit?: no Living arrangements:: with family/others Patient's Overall Health Status Rating: good Typical amount of pain: some (Worsens during inclement weather) Does pain affect daily life?: (!) yes (Reports slowing down to complete tasks but is able to find ways around to complete tasks in an accomodating manner) Are you currently prescribed opioids?: (!) yes (Tramadol )  Dietary Habits and Nutritional Risks How many meals a day?: 2 Eats fruit and vegetables daily?: yes Most meals are obtained by: preparing own meals Diabetic:: (!) yes Any non-healing wounds?: no How often do you check your BS?: 4 (Pricks finger daily) Would you like to be referred to a Nutritionist or for Diabetic Management? : no  Functional Status Activities of Daily Living (to include ambulation/medication): Independent Ambulation: Independent with device- listed below Home Assistive Devices/Equipment: Cane Medication Administration: Independent Home Management (perform basic housework or laundry): Independent Manage your own finances?: yes Primary transportation is: driving Concerns about vision?: no *vision screening is required for WTM* Concerns about hearing?: no  Fall Screening Falls in the past year?: 1 Number of falls in past year: 0 Was there an injury with Fall?: 1  (concussion & vertigo) Fall Risk Category Calculator: 2 Patient Fall Risk Level: Moderate Fall Risk  Fall Risk Patient at Risk for Falls Due to: History of fall(s) Fall risk Follow up: Falls evaluation completed  Home and Transportation Safety: All rugs have non-skid backing?: yes All stairs or steps have railings?: yes Grab bars in the bathtub or shower?: (!) no Have non-skid surface in bathtub or shower?: yes Good home lighting?: yes Regular seat belt use?: yes Hospital stays in the last year:: no  Cognitive Assessment Difficulty concentrating, remembering, or making decisions? : no Will 6CIT or Mini Cog be Completed: no 6CIT or Mini Cog Declined: patient alert, oriented, able to answer questions appropriately and recall recent events  Advance Directives (For Healthcare) Does Patient Have a Medical Advance Directive?: No Would patient like information on creating a medical advance directive?: Yes (MAU/Ambulatory/Procedural Areas - Information given)  Reviewed/Updated  Reviewed/Updated: Reviewed All (Medical, Surgical, Family, Medications, Allergies, Care Teams, Patient Goals); Medical History; Surgical History; Family History; Medications; Allergies; Care Teams; Patient Goals    Allergies (verified) Ace inhibitors, Azithromycin , Erythromycin base, and Lisinopril   Current Medications (verified) Outpatient Encounter Medications as of 12/17/2024  Medication Sig   Accu-Chek Softclix Lancets lancets Use to check blood sugar 3 times daily.   albuterol  (VENTOLIN  HFA) 108 (90 Base) MCG/ACT inhaler Inhale 2 puffs into the lungs every 4 (four) hours as needed for wheezing.   amLODipine  (NORVASC ) 5 MG tablet Take 1 tablet (5 mg total) by mouth daily.   Blood Glucose Monitoring Suppl (ACCU-CHEK GUIDE) w/Device KIT Use to check blood sugar 3 times daily.   glucose blood (ACCU-CHEK GUIDE TEST) test strip Use to check blood sugar 3 times daily.   hydrALAZINE  (APRESOLINE )  100 MG tablet  Take 1 tablet (100 mg total) by mouth 2 (two) times daily.   losartan -hydrochlorothiazide  (HYZAAR ) 100-25 MG tablet TAKE 1 TABLET BY MOUTH DAILY.   Multiple Vitamins-Minerals (MULTIVITAMIN WITH MINERALS) tablet Take 1 tablet by mouth daily.   NON FORMULARY SEGA PRO BLADDER HEALTH 100 MG DAILY   pneumococcal 20-valent conjugate vaccine (PREVNAR 20) 0.5 ML injection Inject 0.5 mLs into the muscle tomorrow at 10 am for 1 dose.   solifenacin  (VESICARE ) 10 MG tablet Take 1 tablet (10 mg total) by mouth daily.   tirzepatide  (MOUNJARO ) 12.5 MG/0.5ML Pen Inject 12.5 mg into the skin once a week.   traMADol  (ULTRAM ) 50 MG tablet Take 1 tablet (50 mg total) by mouth every 12 (twelve) hours as needed.   Zoster Vaccine Adjuvanted [facility]) injection Inject 0.5 mLs into the muscle once for 1 dose.   diazepam  (VALIUM ) 5 MG tablet Take 1 tablet (5 mg total) by mouth 2 (two) times daily. (Patient not taking: Reported on 12/17/2024)   [DISCONTINUED] meclizine  (ANTIVERT ) 25 MG tablet Take 1 tablet (25 mg total) by mouth daily as needed for dizziness. (Patient not taking: Reported on 12/17/2024)   No facility-administered encounter medications on file as of 12/17/2024.    History: Past Medical History:  Diagnosis Date   'Light-for-dates' infant with signs of fetal malnutrition    Anemia    Arthritis    Asthma    Diabetes mellitus without complication (HCC) Dx 2007   GERD (gastroesophageal reflux disease)    Hypertension Dx 1990   Knee pain, bilateral    Obesity    Rheumatic fever childhood    has intermittent murmur and hand arthritis    Past Surgical History:  Procedure Laterality Date   ABDOMINAL HYSTERECTOMY  2006    HERNIA REPAIR  2006    Umbilical    Family History  Problem Relation Age of Onset   Hypertension Mother    Diabetes Mother    Stroke Mother    Heart disease Mother    Obesity Mother    Hypertension Father    Diabetes Father    Stroke Father    Obesity Father    Heart disease  Father    Hyperlipidemia Father    Heart disease Sister    Diabetes Brother    Hypertension Brother    Gout Brother    Breast cancer Cousin    Cancer Neg Hx    Social History   Occupational History   Occupation: Technical Brewer    Comment: [employer], in home care,   Tobacco Use   Smoking status: Never   Smokeless tobacco: Never  Vaping Use   Vaping status: Never Used  Substance and Sexual Activity   Alcohol use: No   Drug use: No   Sexual activity: Not Currently   Tobacco Counseling Counseling given: Not Answered  SDOH Screenings   Food Insecurity: No Food Insecurity (12/17/2024)  Housing: Low Risk (12/17/2024)  Transportation Needs: No Transportation Needs (12/17/2024)  Utilities: Not At Risk (12/17/2024)  Alcohol Screen: Low Risk (02/10/2024)  Depression (PHQ2-9): Low Risk (12/17/2024)  Financial Resource Strain: Medium Risk (02/10/2024)  Physical Activity: Insufficiently Active (12/17/2024)  Social Connections: Moderately Integrated (12/17/2024)  Stress: No Stress Concern Present (12/17/2024)  Tobacco Use: Low Risk (12/17/2024)  Health Literacy: Adequate Health Literacy (12/17/2024)   See flowsheets for full screening details  Depression Screen PHQ 2 & 9 Depression Scale- Over the past 2 weeks, how often have you been bothered  by any of the following problems? Little interest or pleasure in doing things: 0 Feeling down, depressed, or hopeless (PHQ Adolescent also includes...irritable): 0 PHQ-2 Total Score: 0 Trouble falling or staying asleep, or sleeping too much: 0 Feeling tired or having little energy: 1 Poor appetite or overeating (PHQ Adolescent also includes...weight loss): 0 Feeling bad about yourself - or that you are a failure or have let yourself or your family down: 0 Trouble concentrating on things, such as reading the newspaper or watching television (PHQ Adolescent also includes...like school work): 0 Moving or speaking so slowly that other people could have noticed.  Or the opposite - being so fidgety or restless that you have been moving around a lot more than usual: 0 Thoughts that you would be better off dead, or of hurting yourself in some way: 0 PHQ-9 Total Score: 1 If you checked off any problems, how difficult have these problems made it for you to do your work, take care of things at home, or get along with other people?: Not difficult at all      Goals Addressed             This Visit's Progress    Weight (lb) < 200 lb (90.7 kg)   223 lb (101.2 kg)    Would like to get to 150-170 lbs.        ROS: CV: Blood pressure noted to be elevated today on initial check.  Patient reports compliance with her medications.  She has a log with her of home blood pressure readings.  Some of her most recent readings are 128/77, 115/72, 120/68. GeN: She is pleased with continued weight loss on the Mounjaro .  When I last saw her in November she was 237 pounds.  Today she is 223 pounds.     Objective:    Today's Vitals   12/17/24 1418 12/17/24 1453  BP: (!) 140/87 134/83  Pulse: 81   Temp: 97.9 F (36.6 C)   TempSrc: Oral   SpO2: 97%   Weight: 223 lb (101.2 kg)   Height: 5' 2 (1.575 m)    Body mass index is 40.79 kg/m.   Physical Exam    Hearing/Vision screen Vision Screening   Right eye Left eye Both eyes  Without correction     With correction 20/20 20/20 20/13   Whisper test normal.  Immunizations and Health Maintenance Health Maintenance  Topic Date Due   Medicare Annual Wellness (AWV)  Never done   OPHTHALMOLOGY EXAM  Never done   Pneumococcal Vaccine: 50+ Years (1 of 2 - PCV) Never done   Zoster Vaccines- Shingrix (1 of 2) Never done   Colonoscopy  Never done   FOOT EXAM  02/12/2020   COVID-19 Vaccine (3 - Pfizer risk series) 03/22/2020   COLON CANCER SCREENING ANNUAL FOBT  01/14/2024   Bone Density Scan  Never done   HEMOGLOBIN A1C  04/14/2025   Diabetic kidney evaluation - eGFR measurement  10/15/2025   Diabetic kidney  evaluation - Urine ACR  10/15/2025   Mammogram  11/30/2026   DTaP/Tdap/Td (2 - Td or Tdap) 12/27/2028   Influenza Vaccine  Completed   Hepatitis C Screening  Completed   HIV Screening  Completed   Hepatitis B Vaccines 19-59 Average Risk  Aged Out   Meningococcal B Vaccine  Aged Out  Colon cancer screening: She has FIT kit at home but still has not used it as yet.  States that she still has some constipation with  the Mounjaro  but it is getting better since she started using the stool softener. EKG: declines; has several old EKG on chart already     Assessment/Plan:  This is a routine wellness examination for Linda Cooper. 1. Encounter for Medicare annual wellness exam (Primary)   2. Advance directive discussed with patient Discussed advanced directive with her including what is an advanced directive, living will and healthcare power of attorney.  Patient given packet to take home to review as well.  Advised that if she execute this a living will or healthcare power of attorney, please bring a copy for our records so that we can scan it into her chart.    3. Need for vaccination against Streptococcus pneumoniae Agreeable to receiving the Prevnar 20 vaccine.  Prescription given to her to take to her pharmacy. - pneumococcal 20-valent conjugate vaccine (PREVNAR 20) 0.5 ML injection; Inject 0.5 mLs into the muscle tomorrow at 10 am for 1 dose.  Dispense: 0.5 mL; Refill: 0  4. Need for shingles vaccine Agreeable to receiving Shingrix vaccine series.  I have printed the prescription for the first dose.  Advised to take it to our pharmacy downstairs or any outside pharmacy. - Zoster Vaccine Adjuvanted [facility]) injection; Inject 0.5 mLs into the muscle once for 1 dose.  Dispense: 0.5 mL; Refill: 0  5. Postmenopausal estrogen deficiency - DG Bone Density; Future  6. Screening for colon cancer Encourage patient to use the fit test that she has at home and bring it into the lab.  She is way overdue  for colon cancer screening.  She promises to try to do so.  7.  HTN associated with DM Repeat blood pressure today is still above goal but improved.  She has blood pressure log with her from home readings that are good.  No changes made.  She will continue her current medications. Patient Care Team: Vicci Barnie NOVAK, MD as PCP - General (Internal Medicine)  I have personally reviewed and noted the following in the patients chart:   Medical and social history Use of alcohol, tobacco or illicit drugs  Current medications and supplements including opioid prescriptions. Functional ability and status Nutritional status Physical activity Advanced directives List of other physicians Hospitalizations, surgeries, and ER visits in previous 12 months Vitals Screenings to include cognitive, depression, and falls Referrals and appointments  Orders Placed This Encounter  Procedures   DG Bone Density    Standing Status:   Future    Expiration Date:   12/17/2025    Reason for Exam (SYMPTOM  OR DIAGNOSIS REQUIRED):   Osteoporosis screen    Preferred imaging location?:   [facility]   In addition, I have reviewed and discussed with patient certain preventive protocols, quality metrics, and best practice recommendations. A written personalized care plan for preventive services as well as general preventive health recommendations were provided to patient.   Barnie Vicci, MD   12/17/2024   Return in about 10 weeks (around 02/25/2025) for chronic ds management.  "

## 2024-12-22 ENCOUNTER — Ambulatory Visit
Admission: RE | Admit: 2024-12-22 | Discharge: 2024-12-22 | Disposition: A | Discharge status: Home / Self Care | Source: Ambulatory Visit | Attending: Internal Medicine | Admitting: Internal Medicine

## 2024-12-22 ENCOUNTER — Ambulatory Visit: Payer: Self-pay | Admitting: Internal Medicine

## 2024-12-22 DIAGNOSIS — R928 Other abnormal and inconclusive findings on diagnostic imaging of breast: Secondary | ICD-10-CM

## 2024-12-27 ENCOUNTER — Other Ambulatory Visit: Payer: Self-pay

## 2024-12-27 ENCOUNTER — Telehealth: Admitting: Physician Assistant

## 2024-12-27 DIAGNOSIS — K0889 Other specified disorders of teeth and supporting structures: Secondary | ICD-10-CM

## 2024-12-27 MED ORDER — AMOXICILLIN-POT CLAVULANATE 875-125 MG PO TABS
1.0000 | ORAL_TABLET | Freq: Two times a day (BID) | ORAL | 0 refills | Status: AC
  Filled 2024-12-27: qty 14, 7d supply, fill #0

## 2024-12-27 NOTE — Progress Notes (Signed)
 E-Visit for Dental Pain  We are sorry that you are not feeling well.  Here is how we plan to help!  Based on what you have shared with me in the questionnaire, it sounds like you have dental pain from a possible dental infection.  Augmentin  875-125mg  twice a day for 7 days  It is imperative that you see a dentist within 10 days of this eVisit to determine the cause of the dental pain and be sure it is adequately treated  A toothache or tooth pain is caused when the nerve in the root of a tooth or surrounding a tooth is irritated. Dental (tooth) infection, decay, injury, or loss of a tooth are the most common causes of dental pain. Pain may also occur after an extraction (tooth is pulled out). Pain sometimes originates from other areas and radiates to the jaw, thus appearing to be tooth pain.Bacteria growing inside your mouth can contribute to gum disease and dental decay, both of which can cause pain. A toothache occurs from inflammation of the central portion of the tooth called pulp. The pulp contains nerve endings that are very sensitive to pain. Inflammation to the pulp or pulpitis may be caused by dental cavities, trauma, and infection.    HOME CARE:   For toothaches: Over-the-counter pain medications such as acetaminophen  or ibuprofen may be used. Take these as directed on the package while you arrange for a dental appointment. Avoid very cold or hot foods, because they may make the pain worse. You may get relief from biting on a cotton ball soaked in oil of cloves. You can get oil of cloves at most drug stores.  For jaw pain:  Aspirin  may be helpful for problems in the joint of the jaw in adults. If pain happens every time you open your mouth widely, the temporomandibular joint (TMJ) may be the source of the pain. Yawning or taking a large bite of food may worsen the pain. An appointment with your doctor or dentist will help you find the cause.     GET HELP RIGHT AWAY IF:  You have  a high fever or chills If you have had a recent head or face injury and develop headache, light headedness, nausea, vomiting, or other symptoms that concern you after an injury to your face or mouth, you could have a more serious injury in addition to your dental injury. A facial rash associated with a toothache: This condition may improve with medication. Contact your doctor for them to decide what is appropriate. Any jaw pain occurring with chest pain: Although jaw pain is most commonly caused by dental disease, it is sometimes referred pain from other areas. People with heart disease, especially people who have had stents placed, people with diabetes, or those who have had heart surgery may have jaw pain as a symptom of heart attack or angina. If your jaw or tooth pain is associated with lightheadedness, sweating, or shortness of breath, you should see a doctor as soon as possible. Trouble swallowing or excessive pain or bleeding from gums: If you have a history of a weakened immune system, diabetes, or steroid use, you may be more susceptible to infections. Infections can often be more severe and extensive or caused by unusual organisms. Dental and gum infections in people with these conditions may require more aggressive treatment. An abscess may need draining or IV antibiotics, for example.  MAKE SURE YOU   Understand these instructions. Will watch your condition. Will get help right  away if you are not doing well or get worse.  Thank you for choosing an e-visit.  Your e-visit answers were reviewed by a board certified advanced clinical practitioner to complete your personal care plan. Depending upon the condition, your plan could have included both over the counter or prescription medications.  Please review your pharmacy choice. Make sure the pharmacy is open so you can pick up prescription now. If there is a problem, you may contact your provider through [employer] and have the  prescription routed to another pharmacy.  Your safety is important to us . If you have drug allergies check your prescription carefully.   For the next 24 hours you can use MyChart to ask questions about today's visit, request a non-urgent call back, or ask for a work or school excuse. You will get an email in the next two days asking about your experience. I hope that your e-visit has been valuable and will speed your recovery.  I have spent 5 minutes in review of e-visit questionnaire, review and updating patient chart, medical decision making and response to patient.   Teena Shuck, PA-C

## 2025-01-09 ENCOUNTER — Other Ambulatory Visit (HOSPITAL_BASED_OUTPATIENT_CLINIC_OR_DEPARTMENT_OTHER): Payer: Self-pay

## 2025-01-10 ENCOUNTER — Encounter: Payer: Self-pay | Admitting: Internal Medicine

## 2025-01-11 ENCOUNTER — Other Ambulatory Visit: Payer: Self-pay | Admitting: Internal Medicine

## 2025-01-11 ENCOUNTER — Other Ambulatory Visit: Payer: Self-pay

## 2025-01-11 MED ORDER — TIRZEPATIDE 15 MG/0.5ML ~~LOC~~ SOAJ
15.0000 mg | SUBCUTANEOUS | 4 refills | Status: AC
  Filled 2025-01-11: qty 6, 84d supply, fill #0

## 2025-01-12 ENCOUNTER — Other Ambulatory Visit: Payer: Self-pay

## 2025-01-12 ENCOUNTER — Other Ambulatory Visit: Payer: Self-pay | Admitting: Internal Medicine

## 2025-01-12 DIAGNOSIS — E1159 Type 2 diabetes mellitus with other circulatory complications: Secondary | ICD-10-CM

## 2025-01-12 MED ORDER — LOSARTAN POTASSIUM-HCTZ 100-25 MG PO TABS
1.0000 | ORAL_TABLET | Freq: Every day | ORAL | 1 refills | Status: AC
  Filled 2025-01-12: qty 90, 90d supply, fill #0

## 2025-01-13 ENCOUNTER — Other Ambulatory Visit: Payer: Self-pay

## 2025-02-25 ENCOUNTER — Ambulatory Visit: Payer: Self-pay | Admitting: Internal Medicine

## 2025-04-28 ENCOUNTER — Ambulatory Visit: Admitting: Surgical
